# Patient Record
Sex: Female | Born: 1960 | Race: Black or African American | Hispanic: No | Marital: Single | Smoking: Never smoker
Health system: Southern US, Community
[De-identification: ages and names within clinical notes are randomized; demographics above are authoritative.]

## PROBLEM LIST (undated history)

## (undated) DIAGNOSIS — K219 Gastro-esophageal reflux disease without esophagitis: Secondary | ICD-10-CM

## (undated) DIAGNOSIS — E0590 Thyrotoxicosis, unspecified without thyrotoxic crisis or storm: Secondary | ICD-10-CM

## (undated) DIAGNOSIS — Z9289 Personal history of other medical treatment: Secondary | ICD-10-CM

## (undated) DIAGNOSIS — D493 Neoplasm of unspecified behavior of breast: Secondary | ICD-10-CM

## (undated) DIAGNOSIS — K580 Irritable bowel syndrome with diarrhea: Secondary | ICD-10-CM

## (undated) DIAGNOSIS — E049 Nontoxic goiter, unspecified: Secondary | ICD-10-CM

## (undated) DIAGNOSIS — J45909 Unspecified asthma, uncomplicated: Secondary | ICD-10-CM

## (undated) DIAGNOSIS — E538 Deficiency of other specified B group vitamins: Secondary | ICD-10-CM

## (undated) DIAGNOSIS — D649 Anemia, unspecified: Secondary | ICD-10-CM

## (undated) DIAGNOSIS — R55 Syncope and collapse: Secondary | ICD-10-CM

## (undated) DIAGNOSIS — J8410 Pulmonary fibrosis, unspecified: Secondary | ICD-10-CM

## (undated) DIAGNOSIS — N6009 Solitary cyst of unspecified breast: Secondary | ICD-10-CM

## (undated) DIAGNOSIS — E079 Disorder of thyroid, unspecified: Secondary | ICD-10-CM

## (undated) DIAGNOSIS — F319 Bipolar disorder, unspecified: Secondary | ICD-10-CM

## (undated) DIAGNOSIS — F419 Anxiety disorder, unspecified: Secondary | ICD-10-CM

## (undated) DIAGNOSIS — I10 Essential (primary) hypertension: Secondary | ICD-10-CM

## (undated) HISTORY — DX: Gastro-esophageal reflux disease without esophagitis: K21.9

## (undated) HISTORY — DX: Solitary cyst of unspecified breast: N60.09

## (undated) HISTORY — DX: Bipolar disorder, unspecified: F31.9

## (undated) HISTORY — PX: TONSILLECTOMY: SUR1361

## (undated) HISTORY — PX: BREAST BIOPSY: SHX20

## (undated) HISTORY — DX: Deficiency of other specified B group vitamins: E53.8

## (undated) HISTORY — DX: Personal history of other medical treatment: Z92.89

## (undated) HISTORY — DX: Disorder of thyroid, unspecified: E07.9

## (undated) HISTORY — DX: Irritable bowel syndrome with diarrhea: K58.0

## (undated) HISTORY — DX: Pulmonary fibrosis, unspecified: J84.10

## (undated) HISTORY — PX: BUNIONECTOMY: SHX129

## (undated) HISTORY — DX: Nontoxic goiter, unspecified: E04.9

## (undated) HISTORY — PX: TUBAL LIGATION: SHX77

## (undated) HISTORY — DX: Anemia, unspecified: D64.9

## (undated) HISTORY — DX: Syncope and collapse: R55

---

## 1983-08-19 DIAGNOSIS — F319 Bipolar disorder, unspecified: Secondary | ICD-10-CM

## 1983-08-19 HISTORY — PX: GALLBLADDER SURGERY: SHX652

## 1983-08-19 HISTORY — DX: Bipolar disorder, unspecified: F31.9

## 1985-08-18 HISTORY — PX: KNEE SURGERY: SHX244

## 1998-02-08 ENCOUNTER — Emergency Department (HOSPITAL_COMMUNITY): Admission: EM | Admit: 1998-02-08 | Discharge: 1998-02-08 | Payer: Self-pay | Admitting: Emergency Medicine

## 1998-04-08 ENCOUNTER — Emergency Department (HOSPITAL_COMMUNITY): Admission: EM | Admit: 1998-04-08 | Discharge: 1998-04-08 | Payer: Self-pay | Admitting: Emergency Medicine

## 1998-04-09 ENCOUNTER — Emergency Department (HOSPITAL_COMMUNITY): Admission: EM | Admit: 1998-04-09 | Discharge: 1998-04-09 | Payer: Self-pay | Admitting: Emergency Medicine

## 1998-08-09 ENCOUNTER — Encounter: Payer: Self-pay | Admitting: Emergency Medicine

## 1998-08-09 ENCOUNTER — Emergency Department (HOSPITAL_COMMUNITY): Admission: EM | Admit: 1998-08-09 | Discharge: 1998-08-09 | Payer: Self-pay | Admitting: Emergency Medicine

## 1999-03-11 ENCOUNTER — Encounter: Payer: Self-pay | Admitting: Pulmonary Disease

## 1999-04-05 ENCOUNTER — Encounter: Payer: Self-pay | Admitting: Pulmonary Disease

## 1999-08-19 HISTORY — PX: MEDIASTINOSCOPY: SHX5086

## 1999-08-19 HISTORY — PX: STERNOTOMY: SHX1057

## 1999-09-30 ENCOUNTER — Encounter: Payer: Self-pay | Admitting: Pulmonary Disease

## 2002-10-24 ENCOUNTER — Emergency Department (HOSPITAL_COMMUNITY): Admission: EM | Admit: 2002-10-24 | Discharge: 2002-10-24 | Payer: Self-pay | Admitting: Emergency Medicine

## 2003-05-25 ENCOUNTER — Emergency Department (HOSPITAL_COMMUNITY): Admission: EM | Admit: 2003-05-25 | Discharge: 2003-05-25 | Payer: Self-pay | Admitting: Emergency Medicine

## 2003-06-08 ENCOUNTER — Emergency Department (HOSPITAL_COMMUNITY): Admission: EM | Admit: 2003-06-08 | Discharge: 2003-06-08 | Payer: Self-pay | Admitting: Emergency Medicine

## 2003-06-08 ENCOUNTER — Encounter: Payer: Self-pay | Admitting: Emergency Medicine

## 2003-08-30 ENCOUNTER — Ambulatory Visit (HOSPITAL_COMMUNITY): Admission: RE | Admit: 2003-08-30 | Discharge: 2003-08-30 | Payer: Self-pay | Admitting: Family Medicine

## 2004-01-08 ENCOUNTER — Encounter: Admission: RE | Admit: 2004-01-08 | Discharge: 2004-02-06 | Payer: Self-pay | Admitting: Orthopedic Surgery

## 2004-02-27 ENCOUNTER — Emergency Department (HOSPITAL_COMMUNITY): Admission: EM | Admit: 2004-02-27 | Discharge: 2004-02-27 | Payer: Self-pay | Admitting: Emergency Medicine

## 2004-02-29 ENCOUNTER — Encounter: Admission: RE | Admit: 2004-02-29 | Discharge: 2004-03-20 | Payer: Self-pay | Admitting: Orthopedic Surgery

## 2004-03-01 ENCOUNTER — Encounter: Admission: RE | Admit: 2004-03-01 | Discharge: 2004-03-01 | Payer: Self-pay | Admitting: Emergency Medicine

## 2004-03-06 ENCOUNTER — Emergency Department (HOSPITAL_COMMUNITY): Admission: EM | Admit: 2004-03-06 | Discharge: 2004-03-07 | Payer: Self-pay | Admitting: Emergency Medicine

## 2005-08-23 ENCOUNTER — Emergency Department (HOSPITAL_COMMUNITY): Admission: EM | Admit: 2005-08-23 | Discharge: 2005-08-23 | Payer: Self-pay | Admitting: Emergency Medicine

## 2005-10-03 ENCOUNTER — Emergency Department (HOSPITAL_COMMUNITY): Admission: EM | Admit: 2005-10-03 | Discharge: 2005-10-03 | Payer: Self-pay | Admitting: Emergency Medicine

## 2006-10-06 ENCOUNTER — Other Ambulatory Visit: Admission: RE | Admit: 2006-10-06 | Discharge: 2006-10-06 | Payer: Self-pay | Admitting: Gynecology

## 2006-10-22 ENCOUNTER — Encounter: Admission: RE | Admit: 2006-10-22 | Discharge: 2006-10-22 | Payer: Self-pay | Admitting: Gynecology

## 2006-11-04 ENCOUNTER — Encounter: Admission: RE | Admit: 2006-11-04 | Discharge: 2006-11-04 | Payer: Self-pay | Admitting: Gynecology

## 2006-11-28 ENCOUNTER — Emergency Department (HOSPITAL_COMMUNITY): Admission: EM | Admit: 2006-11-28 | Discharge: 2006-11-28 | Payer: Self-pay | Admitting: Emergency Medicine

## 2007-06-27 ENCOUNTER — Emergency Department (HOSPITAL_COMMUNITY): Admission: EM | Admit: 2007-06-27 | Discharge: 2007-06-27 | Payer: Self-pay | Admitting: Emergency Medicine

## 2008-02-29 ENCOUNTER — Emergency Department (HOSPITAL_COMMUNITY): Admission: EM | Admit: 2008-02-29 | Discharge: 2008-02-29 | Payer: Self-pay | Admitting: Emergency Medicine

## 2008-04-11 ENCOUNTER — Emergency Department (HOSPITAL_COMMUNITY): Admission: EM | Admit: 2008-04-11 | Discharge: 2008-04-11 | Payer: Self-pay | Admitting: Emergency Medicine

## 2008-11-21 ENCOUNTER — Encounter: Admission: RE | Admit: 2008-11-21 | Discharge: 2008-11-21 | Payer: Self-pay | Admitting: Family Medicine

## 2009-01-30 ENCOUNTER — Emergency Department (HOSPITAL_COMMUNITY): Admission: EM | Admit: 2009-01-30 | Discharge: 2009-01-30 | Payer: Self-pay | Admitting: Emergency Medicine

## 2009-02-20 ENCOUNTER — Encounter (HOSPITAL_COMMUNITY): Admission: RE | Admit: 2009-02-20 | Discharge: 2009-05-17 | Payer: Self-pay | Admitting: Endocrinology

## 2009-03-29 ENCOUNTER — Emergency Department (HOSPITAL_COMMUNITY): Admission: EM | Admit: 2009-03-29 | Discharge: 2009-03-29 | Payer: Self-pay | Admitting: Emergency Medicine

## 2009-04-17 ENCOUNTER — Emergency Department (HOSPITAL_COMMUNITY): Admission: EM | Admit: 2009-04-17 | Discharge: 2009-04-17 | Payer: Self-pay | Admitting: Family Medicine

## 2009-04-20 ENCOUNTER — Emergency Department (HOSPITAL_COMMUNITY): Admission: EM | Admit: 2009-04-20 | Discharge: 2009-04-20 | Payer: Self-pay | Admitting: Emergency Medicine

## 2009-09-11 ENCOUNTER — Emergency Department (HOSPITAL_COMMUNITY): Admission: EM | Admit: 2009-09-11 | Discharge: 2009-09-11 | Payer: Self-pay | Admitting: Emergency Medicine

## 2009-09-26 ENCOUNTER — Emergency Department (HOSPITAL_COMMUNITY): Admission: EM | Admit: 2009-09-26 | Discharge: 2009-09-26 | Payer: Self-pay | Admitting: Emergency Medicine

## 2009-11-22 ENCOUNTER — Encounter: Admission: RE | Admit: 2009-11-22 | Discharge: 2009-11-22 | Payer: Self-pay | Admitting: Family Medicine

## 2009-12-13 ENCOUNTER — Encounter: Admission: RE | Admit: 2009-12-13 | Discharge: 2009-12-13 | Payer: Self-pay | Admitting: Family Medicine

## 2009-12-18 ENCOUNTER — Encounter: Admission: RE | Admit: 2009-12-18 | Discharge: 2009-12-18 | Payer: Self-pay | Admitting: Family Medicine

## 2010-01-04 ENCOUNTER — Encounter: Payer: Self-pay | Admitting: Family

## 2010-01-08 ENCOUNTER — Encounter: Payer: Self-pay | Admitting: Family

## 2010-02-07 ENCOUNTER — Encounter: Payer: Self-pay | Admitting: Pulmonary Disease

## 2010-02-07 ENCOUNTER — Emergency Department (HOSPITAL_COMMUNITY): Admission: EM | Admit: 2010-02-07 | Discharge: 2010-02-07 | Payer: Self-pay | Admitting: Emergency Medicine

## 2010-02-11 ENCOUNTER — Encounter (INDEPENDENT_AMBULATORY_CARE_PROVIDER_SITE_OTHER): Payer: Self-pay | Admitting: *Deleted

## 2010-02-11 ENCOUNTER — Ambulatory Visit: Payer: Self-pay | Admitting: Family

## 2010-02-11 DIAGNOSIS — R0789 Other chest pain: Secondary | ICD-10-CM | POA: Insufficient documentation

## 2010-02-11 DIAGNOSIS — K219 Gastro-esophageal reflux disease without esophagitis: Secondary | ICD-10-CM | POA: Insufficient documentation

## 2010-02-11 DIAGNOSIS — E0590 Thyrotoxicosis, unspecified without thyrotoxic crisis or storm: Secondary | ICD-10-CM | POA: Insufficient documentation

## 2010-02-11 DIAGNOSIS — D649 Anemia, unspecified: Secondary | ICD-10-CM | POA: Insufficient documentation

## 2010-02-11 DIAGNOSIS — J8410 Pulmonary fibrosis, unspecified: Secondary | ICD-10-CM | POA: Insufficient documentation

## 2010-02-12 ENCOUNTER — Encounter: Payer: Self-pay | Admitting: Family

## 2010-02-12 ENCOUNTER — Telehealth: Payer: Self-pay | Admitting: Family

## 2010-02-12 ENCOUNTER — Encounter (INDEPENDENT_AMBULATORY_CARE_PROVIDER_SITE_OTHER): Payer: Self-pay | Admitting: *Deleted

## 2010-02-12 LAB — CONVERTED CEMR LAB
Iron: 11 ug/dL — ABNORMAL LOW (ref 42–145)
Saturation Ratios: 3 % — ABNORMAL LOW (ref 20–55)
TIBC: 438 ug/dL (ref 250–470)
UIBC: 427 ug/dL
Vitamin B-12: 228 pg/mL (ref 211–911)

## 2010-02-13 ENCOUNTER — Telehealth: Payer: Self-pay | Admitting: Family

## 2010-02-28 ENCOUNTER — Telehealth: Payer: Self-pay | Admitting: Family

## 2010-03-01 ENCOUNTER — Ambulatory Visit: Payer: Self-pay | Admitting: Pulmonary Disease

## 2010-03-08 ENCOUNTER — Encounter (INDEPENDENT_AMBULATORY_CARE_PROVIDER_SITE_OTHER): Payer: Self-pay | Admitting: *Deleted

## 2010-03-08 ENCOUNTER — Ambulatory Visit: Payer: Self-pay | Admitting: Gastroenterology

## 2010-03-08 DIAGNOSIS — F319 Bipolar disorder, unspecified: Secondary | ICD-10-CM | POA: Insufficient documentation

## 2010-03-08 DIAGNOSIS — E538 Deficiency of other specified B group vitamins: Secondary | ICD-10-CM | POA: Insufficient documentation

## 2010-03-11 ENCOUNTER — Ambulatory Visit: Payer: Self-pay | Admitting: Family

## 2010-03-12 ENCOUNTER — Telehealth: Payer: Self-pay | Admitting: Family

## 2010-03-13 ENCOUNTER — Ambulatory Visit: Payer: Self-pay | Admitting: Gastroenterology

## 2010-03-13 LAB — HM COLONOSCOPY

## 2010-03-15 ENCOUNTER — Ambulatory Visit: Payer: Self-pay | Admitting: Gastroenterology

## 2010-03-19 ENCOUNTER — Encounter: Payer: Self-pay | Admitting: Pulmonary Disease

## 2010-03-20 ENCOUNTER — Ambulatory Visit: Payer: Self-pay | Admitting: Family

## 2010-03-22 ENCOUNTER — Telehealth: Payer: Self-pay | Admitting: Family

## 2010-03-22 ENCOUNTER — Ambulatory Visit: Payer: Self-pay | Admitting: Gastroenterology

## 2010-03-25 ENCOUNTER — Encounter: Payer: Self-pay | Admitting: Gastroenterology

## 2010-04-18 ENCOUNTER — Encounter: Payer: Self-pay | Admitting: Family

## 2010-04-18 LAB — CONVERTED CEMR LAB
Eosinophils Absolute: 0.1 10*3/uL (ref 0.0–0.7)
Eosinophils Relative: 1 % (ref 0–5)
HCT: 37.4 % (ref 36.0–46.0)
Hemoglobin: 11 g/dL — ABNORMAL LOW (ref 12.0–15.0)
Lymphocytes Relative: 19 % (ref 12–46)
Lymphs Abs: 1.7 10*3/uL (ref 0.7–4.0)
MCV: 83.1 fL (ref 78.0–100.0)
Monocytes Relative: 6 % (ref 3–12)
RBC: 4.5 M/uL (ref 3.87–5.11)
WBC: 9.1 10*3/uL (ref 4.0–10.5)

## 2010-04-19 ENCOUNTER — Encounter: Payer: Self-pay | Admitting: Family

## 2010-04-24 ENCOUNTER — Ambulatory Visit: Payer: Self-pay | Admitting: Gastroenterology

## 2010-04-30 ENCOUNTER — Telehealth: Payer: Self-pay | Admitting: Gastroenterology

## 2010-05-01 ENCOUNTER — Ambulatory Visit: Payer: Self-pay | Admitting: Gastroenterology

## 2010-05-01 DIAGNOSIS — M79609 Pain in unspecified limb: Secondary | ICD-10-CM | POA: Insufficient documentation

## 2010-05-02 ENCOUNTER — Telehealth: Payer: Self-pay | Admitting: Family

## 2010-05-15 ENCOUNTER — Ambulatory Visit: Payer: Self-pay | Admitting: Family

## 2010-05-17 ENCOUNTER — Encounter: Payer: Self-pay | Admitting: Family

## 2010-05-21 ENCOUNTER — Ambulatory Visit: Payer: Self-pay | Admitting: Internal Medicine

## 2010-05-21 LAB — CONVERTED CEMR LAB
Basophils Absolute: 0 10*3/uL (ref 0.0–0.1)
Eosinophils Absolute: 0.1 10*3/uL (ref 0.0–0.7)
Eosinophils Relative: 1 % (ref 0–5)
HCT: 38.4 % (ref 36.0–46.0)
Hemoglobin: 11.2 g/dL — ABNORMAL LOW (ref 12.0–15.0)
Lymphs Abs: 1.4 10*3/uL (ref 0.7–4.0)
MCV: 87.1 fL (ref 78.0–100.0)
Monocytes Absolute: 0.6 10*3/uL (ref 0.1–1.0)
Platelets: 245 10*3/uL (ref 150–400)
RDW: 16.8 % — ABNORMAL HIGH (ref 11.5–15.5)
TSH: 0.478 microintl units/mL (ref 0.350–4.500)

## 2010-05-22 ENCOUNTER — Encounter: Payer: Self-pay | Admitting: Family

## 2010-05-22 ENCOUNTER — Ambulatory Visit: Payer: Self-pay | Admitting: Gastroenterology

## 2010-05-23 ENCOUNTER — Ambulatory Visit (HOSPITAL_COMMUNITY): Admission: RE | Admit: 2010-05-23 | Discharge: 2010-05-23 | Payer: Self-pay | Admitting: Internal Medicine

## 2010-05-24 ENCOUNTER — Encounter: Payer: Self-pay | Admitting: Family

## 2010-06-03 HISTORY — PX: ABDOMINAL HYSTERECTOMY: SHX81

## 2010-06-16 ENCOUNTER — Other Ambulatory Visit: Payer: Self-pay | Admitting: Emergency Medicine

## 2010-06-16 ENCOUNTER — Ambulatory Visit (HOSPITAL_COMMUNITY): Admission: AD | Admit: 2010-06-16 | Discharge: 2010-06-16 | Payer: Self-pay | Admitting: Obstetrics and Gynecology

## 2010-06-24 ENCOUNTER — Ambulatory Visit: Payer: Self-pay | Admitting: Gastroenterology

## 2010-07-16 ENCOUNTER — Encounter: Admission: RE | Admit: 2010-07-16 | Discharge: 2010-07-16 | Payer: Self-pay

## 2010-07-23 ENCOUNTER — Ambulatory Visit: Payer: Self-pay | Admitting: Family

## 2010-07-23 DIAGNOSIS — N6009 Solitary cyst of unspecified breast: Secondary | ICD-10-CM | POA: Insufficient documentation

## 2010-07-23 LAB — CONVERTED CEMR LAB
Basophils Absolute: 0.1 10*3/uL (ref 0.0–0.1)
Basophils Relative: 1 % (ref 0–1)
Eosinophils Relative: 2 % (ref 0–5)
HCT: 36.2 % (ref 36.0–46.0)
Hemoglobin: 10.6 g/dL — ABNORMAL LOW (ref 12.0–15.0)
Lymphocytes Relative: 25 % (ref 12–46)
MCHC: 29.3 g/dL — ABNORMAL LOW (ref 30.0–36.0)
Monocytes Absolute: 0.7 10*3/uL (ref 0.1–1.0)
Platelets: 262 10*3/uL (ref 150–400)
RDW: 15 % (ref 11.5–15.5)
Vitamin B-12: 415 pg/mL (ref 211–911)

## 2010-07-24 ENCOUNTER — Encounter: Payer: Self-pay | Admitting: Family

## 2010-07-24 ENCOUNTER — Telehealth: Payer: Self-pay | Admitting: Family

## 2010-07-26 ENCOUNTER — Ambulatory Visit: Payer: Self-pay | Admitting: Gastroenterology

## 2010-08-27 ENCOUNTER — Ambulatory Visit
Admission: RE | Admit: 2010-08-27 | Discharge: 2010-08-27 | Payer: Self-pay | Source: Home / Self Care | Attending: Gastroenterology | Admitting: Gastroenterology

## 2010-09-07 ENCOUNTER — Encounter: Payer: Self-pay | Admitting: Family Medicine

## 2010-09-08 ENCOUNTER — Encounter: Payer: Self-pay | Admitting: Family Medicine

## 2010-09-08 ENCOUNTER — Encounter: Payer: Self-pay | Admitting: Gynecology

## 2010-09-09 ENCOUNTER — Encounter: Payer: Self-pay | Admitting: Endocrinology

## 2010-09-15 LAB — CONVERTED CEMR LAB
ALT: <8 units/L (ref 0–35)
AST: 15 units/L (ref 0–37)
Alkaline Phosphatase: 50 units/L (ref 39–117)
Calcium: 9.5 mg/dL (ref 8.4–10.5)
Chloride: 104 meq/L (ref 96–112)
Creatinine, Ser: 0.74 mg/dL (ref 0.40–1.20)
Eosinophils Absolute: 0.1 10*3/uL (ref 0.0–0.7)
Lymphocytes Relative: 21 % (ref 12–46)
Lymphs Abs: 1.8 10*3/uL (ref 0.7–4.0)
MCV: 74.3 fL — ABNORMAL LOW (ref 78.0–100.0)
Neutrophils Relative %: 69 % (ref 43–77)
Pap Smear: NORMAL
Platelets: 304 10*3/uL (ref 150–400)
WBC: 8.3 10*3/uL (ref 4.0–10.5)

## 2010-09-17 NOTE — Letter (Signed)
Summary: Work Dietitian at [employer]. [address]   [place], [place] [address]   Phone: [phone]  Fax: [phone]      Today's Date: February 11, 2010  Name of Patient: Lynn Gonzalez  The above named patient had a medical visit today. Please take this into consideration when reviewing the time away from work.    Special Instructions:  Arly.Keller ] None  [  ] To be off the remainder of today, returning to the normal work / school schedule tomorrow.  [  ] To be off until the next scheduled appointment on ______________________.  [X]  Other _[name] may return to work without any restrictions.   Sincerely yours,    Sandford Craze, NP

## 2010-09-17 NOTE — Progress Notes (Signed)
Summary: repeat CBC  Phone Note Outgoing Call   Summary of Call: Could you please call patient and let her know that I would like to repeat her CBC in the next 1 week.  (dx anemia).  When I reviewed her labs- I realized that her last CBC was over a month ago.  I would like to see if it is improving. Initial call taken by: Lemont Fillers FNP,  March 22, 2010 11:59 AM  Follow-up for Phone Call        Pt advised and states she will come by next week for CBC. Order has been sent to the lab.  [name] CMA Duncan Dull)  March 22, 2010 1:03 PM

## 2010-09-17 NOTE — Assessment & Plan Note (Signed)
Summary: lung disease/ mbw   Visit Type:  Initial Consult Copy to:  pcp Primary Provider/Referring Provider:  Lemont Fillers FNP  CC:  Pt here for pulmonary consult.  History of Present Illness: 50/F, never smoker, bipolar disorder for evaluation of abnormal imaging. She was seen at the [facility] ED for chest pain on 6/23 .Sserial cardiac enzymes were negative.  D Dimer was elevated and was followed by  a CTA chest which was negative for PE.  She was noted incidentally on CT to have diffuse reticular interstitial lung disease which had worsened slightly since the prior CT from April, 2008.  She denies known history of lung disease, but does note that she becomes winded with walking.  She reports surgery /mediastinoscopy at [facility] in 2001 followed by sternotomy at [facility] - but does not know details.  She reports that chest pain is constant and is not worsened by exertion, nor improved with rest.   Chest pain is not exacerbated by activity, nor improved with rest.  She notes some improvement with prescription pain medication, but then pain returns when medication wears off. She is to have a GI evaluation on 6/22 for Fe def anemia    Preventive Screening-Counseling & Management  Alcohol-Tobacco     Smoking Status: never  Current Medications (verified): 1)  Lithium Carbonate 300 Mg Tabs (Lithium Carbonate) .... Take 1 Every Morning And1/2-1 At Bedtime. 2)  Seroquel 300 Mg Tabs (Quetiapine Fumarate) .... 1/2 To 1 At Bedtime. 3)  Protonix 40 Mg Solr (Pantoprazole Sodium) .... One Tablet By Mouth Daily 4)  Ferrous Sulfate 325 (65 Fe) Mg Tbec (Ferrous Sulfate) .... One Tab By Mouth Three Times A Day 5)  Vitamin B-12 1000 Mcg Tabs (Cyanocobalamin) .... One Tablet By Mouth Daily  Allergies (verified): No Known Drug Allergies  Past History:  Past Medical History: Last updated: 02/11/2010 Bipolar (?nervous breakdown--1985) ?THYROID OVERACTIVE  Past Surgical History: Tubal  ligation--1985, 1996 Gall stones removed-1985 mediastinoscopy/ sternotomy--2001 Bunions removed from both feet Bilateral knee surgery--1987  Family History: ?cancer--father Diabetes--mother HTN--parents Cancer-father  Review of Systems       The patient complains of chest pain, weight change, difficulty swallowing, tooth/dental problems, and depression.  The patient denies shortness of breath with activity, shortness of breath at rest, productive cough, non-productive cough, coughing up blood, irregular heartbeats, acid heartburn, indigestion, loss of appetite, abdominal pain, sore throat, headaches, nasal congestion/difficulty breathing through nose, sneezing, itching, ear ache, anxiety, hand/feet swelling, joint stiffness or pain, rash, change in color of mucus, and fever.    Vital Signs:  Patient profile:   50 year old female Height:      65 inches Weight:      145.4 pounds BMI:     24.28 O2 Sat:      100 % on Room air Temp:     98.6 degrees F oral Pulse rate:   81 / minute BP sitting:   110 / 70  (left arm) Cuff size:   regular  Vitals Entered By: Zackery Barefoot CMA (March 01, 2010 9:38 AM)  O2 Flow:  Room air CC: Pt here for pulmonary consult Comments Medications reviewed with patient Verified contact number and pharmacy with patient Zackery Barefoot CMA  March 01, 2010 9:40 AM    Physical Exam  Additional Exam:  Gen. Pleasant, well-nourished, in no distress, normal affect ENT - no lesions, no post nasal drip Neck: No JVD, no thyromegaly, no carotid bruits Lungs: no use of accessory muscles,  no dullness to percussion, clear without rales or rhonchi  Cardiovascular: Rhythm regular, heart sounds  normal, no murmurs or gallops, no peripheral edema Abdomen: soft and non-tender, no hepatosplenomegaly, BS normal. Musculoskeletal: No deformities, no cyanosis or clubbing Neuro:  alert, non focal     Impression & Recommendations:  Problem # 1:  INTERSTITIAL LUNG  DISEASE (ICD-515) Unclear cause but would favor sarcoidosis obtain records about surgeries & pathology from [facility] in 2001 Chk ANa, ESR, RA factor, ACe level Full pFTs  Medications Added to Medication List This Visit: 1)  Lithium Carbonate 300 Mg Tabs (Lithium carbonate) .... Take 1 every morning and1/2-1 at bedtime.  Other Orders: T-Angiotensin i-Converting Enzyme ([id number]) T-Antinuclear Antib (ANA) [phone]) T- * Misc. Laboratory test [phone]) Pulmonary Referral (Pulmonary) TLB-Sedimentation Rate (ESR) (85652-ESR) Consultation Level IV (46962)  Patient Instructions: 1)  Please schedule a follow-up appointment in 1 month at [facility] 2)  You have scar tissue in your lungs 3)  We will obtain records about your surgeries & pathology from [facility] in 2001 4)  Blood owrk today 5)  You have been asked to make an appointment for a Pulmonary Function Test (Breathing Test) prior to or at the time of your next visit. Use medications as usual unless otherwise instructed.    Appended Document: lung disease/ mbw obtain records about  surgeries & pathology from [facility] in 2001  Appended Document: lung disease/ mbw request faxed. 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

## 2010-09-17 NOTE — Assessment & Plan Note (Signed)
Summary: Dr.Patterson pt.-11 a.m. appt   History of Present Illness Visit Type: Follow-up Visit Primary GI MD: Sheryn Bison MD FACP FAGA Primary Provider: Lemont Fillers FNP Requesting Provider: Sandford Craze, DO Chief Complaint: B12 injection  site sore and swollen History of Present Illness:   Patient is a 50 year old female who is in process of anemia workup by Dr. Jarold Motto and has recently started  receiving B12 injections. Patient's last B12 injection with [place] was approximately six days ago. Since injection patient has had intermittent discomfort. She called with complaints of swelling and erythema and is here to be checked.  She wants to continue injections as they make her feel better.   GI Review of Systems      Denies abdominal pain, acid reflux, belching, bloating, chest pain, dysphagia with liquids, dysphagia with solids, heartburn, loss of appetite, nausea, vomiting, vomiting blood, weight loss, and  weight gain.        Denies anal fissure, black tarry stools, change in bowel habit, constipation, diarrhea, diverticulosis, fecal incontinence, heme positive stool, hemorrhoids, irritable bowel syndrome, jaundice, light color stool, liver problems, rectal bleeding, and  rectal pain.    Current Medications (verified): 1)  Lithium Carbonate 300 Mg Tabs (Lithium Carbonate) .... Take 1 Every Morning And1/2-1 At Bedtime. 2)  Seroquel 300 Mg Tabs (Quetiapine Fumarate) .... 1/2 To 1 At Bedtime. 3)  Protonix 40 Mg Solr (Pantoprazole Sodium) .... One Tablet By Mouth Daily 4)  Ferrous Sulfate 325 (65 Fe) Mg Tbec (Ferrous Sulfate) .... One Tab By Mouth Three Times A Day 5)  Cyanocobalamin 1000 Mcg/ml Soln (Cyanocobalamin) .... Monthly Im Injections 6)  Cyanocobalamin 1000 Mcg/ml Inj Soln (Cyanocobalamin) .Marland Kitchen.. 1 Cc Im Weekly X 3 Than Monthly 7)  Folic Acid 1 Mg Tabs (Folic Acid) .Marland Kitchen.. 1 By Mouth Once Daily  Allergies (verified): No Known Drug Allergies  Past History:  Past  Medical History: Reviewed history from 02/11/2010 and no changes required. Bipolar (?nervous breakdown--1985) ?THYROID OVERACTIVE  Past Surgical History: Reviewed history from 03/01/2010 and no changes required. Tubal ligation--1985, 1996 Gall stones removed-1985 mediastinoscopy/ sternotomy--2001 Bunions removed from both feet Bilateral knee surgery--1987  Family History: Reviewed history from 03/01/2010 and no changes required. ?cancer--father Diabetes--mother HTN--parents Cancer-father  Social History: Reviewed history from 02/11/2010 and no changes required. Occupation: [employer] Never Smoked Alcohol use-no Regular exercise-yes  Vital Signs:  Patient profile:   50 year old female Height:      66 inches Weight:      152.38 pounds BMI:     24.68 Pulse rate:   84 / minute Pulse rhythm:   regular BP sitting:   120 / 70  (left arm) Cuff size:   regular  Vitals Entered By: June McMurray CMA Duncan Dull) (May 01, 2010 11:24 AM)  Physical Exam  General:  Well developed, well nourished, no acute distress. Extremities:  Left upper extremity comparable in size to RUE. Deltoid muscle non-tender. No obvious erythema. No  lumps. Moves LUE without difficulty. Normal left radial pulse.  Psych:  Appropriate   Impression & Recommendations:  Problem # 1:  ARM PAIN, LEFT (ICD-729.5) Assessment New Called with left upper extremity pain after B12 injection last week. Left upper extremity examination unremarkable. Patient can take Tylenol as needed for pain.

## 2010-09-17 NOTE — Letter (Signed)
Summary: [facility] Instructions  [facility]  [address] [place], [place] [address]   Phone: [phone]  Fax: [phone]       Lynn Gonzalez    05/28/1961    MRN: [id number]        Procedure Day Dorna Bloom: Wednesday, 03/13/10     Arrival Time: 1;30      Procedure Time: 2:30     Location of Procedure:                    _X _   [facility] (4th Floor)                        PREPARATION FOR COLONOSCOPY WITH MOVIPREP   Starting 5 days prior to your procedure 03/08/10 do not eat nuts, seeds, popcorn, corn, beans, peas,  salads, or any raw vegetables.  Do not take any fiber supplements (e.g. Metamucil, Citrucel, and Benefiber).  THE DAY BEFORE YOUR PROCEDURE         DATE: 03/12/10    DAY: Tuesday  1.  Drink clear liquids the entire day-NO SOLID FOOD  2.  Do not drink anything colored red or purple.  Avoid juices with pulp.  No orange juice.  3.  Drink at least 64 oz. (8 glasses) of fluid/clear liquids during the day to prevent dehydration and help the prep work efficiently.  CLEAR LIQUIDS INCLUDE: Water Jello Ice Popsicles Tea (sugar ok, no milk/cream) Powdered fruit flavored drinks Coffee (sugar ok, no milk/cream) Gatorade Juice: apple, white grape, white cranberry  Lemonade Clear bullion, consomm, broth Carbonated beverages (any kind) Strained chicken noodle soup Hard Candy                             4.  In the morning, mix first dose of MoviPrep solution:    Empty 1 Pouch A and 1 Pouch B into the disposable container    Add lukewarm drinking water to the top line of the container. Mix to dissolve    Refrigerate (mixed solution should be used within 24 hrs)  5.  Begin drinking the prep at 5:00 p.m. The MoviPrep container is divided by 4 marks.   Every 15 minutes drink the solution down to the next mark (approximately 8 oz) until the full liter is complete.   6.  Follow completed prep with 16 oz of clear liquid of your choice (Nothing  red or purple).  Continue to drink clear liquids until bedtime.  7.  Before going to bed, mix second dose of MoviPrep solution:    Empty 1 Pouch A and 1 Pouch B into the disposable container    Add lukewarm drinking water to the top line of the container. Mix to dissolve    Refrigerate  THE DAY OF YOUR PROCEDURE      DATE: 03/13/10   DAY: Wednesday  Beginning at 9:30 a.m. (5 hours before procedure):         1. Every 15 minutes, drink the solution down to the next mark (approx 8 oz) until the full liter is complete.  2. Follow completed prep with 16 oz. of clear liquid of your choice.    3. You may drink clear liquids until  12:30 (2 HOURS BEFORE PROCEDURE).   MEDICATION INSTRUCTIONS  Unless otherwise instructed, you should take regular prescription medications with a small sip of water   as early as possible  the morning of your procedure.                    OTHER INSTRUCTIONS  You will need a responsible adult at least 48 years of age to accompany you and drive you home.   This person must remain in the waiting room during your procedure.  Wear loose fitting clothing that is easily removed.  Leave jewelry and other valuables at home.  However, you may wish to bring a book to read or  an iPod/MP3 player to listen to music as you wait for your procedure to start.  Remove all body piercing jewelry and leave at home.  Total time from sign-in until discharge is approximately 2-3 hours.  You should go home directly after your procedure and rest.  You can resume normal activities the  day after your procedure.  The day of your procedure you should not:   Drive   Make legal decisions   Operate machinery   Drink alcohol   Return to work  You will receive specific instructions about eating, activities and medications before you leave.    The above instructions have been reviewed and explained to me by   _______________________    I fully understand and  can verbalize these instructions _____________________________ Date _________

## 2010-09-17 NOTE — Assessment & Plan Note (Signed)
Summary: WEEKLY B12 SHOT...LSW.  Nurse Visit   Allergies: No Known Drug Allergies  Medication Administration  Injection # 1:    Medication: Vit B12 1000 mcg    Diagnosis: B12 DEFICIENCY (ICD-266.2)    Route: IM    Site: L deltoid    Exp Date: 4/13    Lot #: [id number]    Mfr: [employer]    Patient tolerated injection without complications    Given by: Karen Kitchens Nelson-Smith CMA (AAMA) (March 22, 2010 9:08 AM)  Orders Added: 1)  Vit B12 1000 mcg [J3420]   Medication Administration  Injection # 1:    Medication: Vit B12 1000 mcg    Diagnosis: B12 DEFICIENCY (ICD-266.2)    Route: IM    Site: L deltoid    Exp Date: 4/13    Lot #: [id number]    Mfr: [employer]    Patient tolerated injection without complications    Given by: Karen Kitchens Nelson-Smith CMA (AAMA) (March 22, 2010 9:08 AM)  Orders Added: 1)  Vit B12 1000 mcg [J3420]

## 2010-09-17 NOTE — Miscellaneous (Signed)
Summary: ekg  Clinical Lists Changes  Orders: Added new Service order of EKG w/ Interpretation (93000) - Signed

## 2010-09-17 NOTE — Assessment & Plan Note (Signed)
Summary: 2 month follow up/mhf rsch from bmp list/d--Rm 5   Vital Signs:  Patient profile:   50 year old female Height:      66 inches Weight:      149 pounds BMI:     24.14 Temp:     98.0 degrees F oral Pulse rate:   78 / minute Pulse rhythm:   regular Resp:     16 per minute BP sitting:   110 / 70  (right arm) Cuff size:   regular  Vitals Entered By: Mervin Kung CMA (AAMA) (May 21, 2010 9:02 AM) CC: Rm 5  2 month f/u.  Pt having hysterectomy 06/03/10. Is Patient Diabetic? No Comments Pt needs clarification on medications.  Needs refills on Protonix and Folic Acid. Lynn Gonzalez CMA Duncan Dull)  May 21, 2010 9:15 AM    Primary Care Lynn Gonzalez:  Lemont Fillers FNP  CC:  Rm 5  2 month f/u.  Pt having hysterectomy 06/03/10.Marland Kitchen  History of Present Illness: Lynn Gonzalez is a 50 year old female who presents today for follow up.    1)Menorrhagia-  Pt notes that she is having a heavy menstrual cycle every 14 days.  These cycles are very heavy and are associated with severe menstrual cramps.  She is currently scheduled for a hysterectomy on 10/17 with Dr. Jennette Kettle.  2) GERD- better on protonix.  She continues to have a sense of "choking sensation" when she swallows. She had a negative endoscopy performed in June.  She notes poor appetite and early satiety.  3)Anemia-  she continues with iron supplements and monthly B12 injections.  4)Interstitial lung disease- she is being evaluated by Dr. Vassie Loll for suspected sarcoidosis.   Allergies (verified): No Known Drug Allergies  Past History:  Past Medical History: Last updated: 02/11/2010 Bipolar (?nervous breakdown--1985) ?THYROID OVERACTIVE  Past Surgical History: Last updated: 03/01/2010 Tubal ligation--1985, 1996 Gall stones removed-1985 mediastinoscopy/ sternotomy--2001 Bunions removed from both feet Bilateral knee surgery--1987 PMH reviewed for relevance  Review of Systems       see HPI  Physical  Exam  General:  Well-developed,well-nourished,in no acute distress; alert,appropriate and cooperative throughout examination Lungs:  Normal respiratory effort, chest expands symmetrically. Lungs are clear to auscultation, no crackles or wheezes. Heart:  Normal rate and regular rhythm. S1 and S2 normal without gallop, murmur, click, rub or other extra sounds. Extremities:  No clubbing, cyanosis, edema, or deformity noted with normal full range of motion of all joints.     Impression & Recommendations:  Problem # 1:  MENORRHAGIA, PERIMENOPAUSAL (ICD-626.8) Assessment Deteriorated Patient is scheduled for a hysterectomy in 2 weeks.  This hopefully will help control her anemia.  Orders: TLB-CBC Platelet - w/Differential ([id number])  Problem # 2:  HYPERTHYROIDISM (ICD-242.90) Assessment: New Patient with history of hyperthyroid.  Notes + weight loss this year of about 10 pounds.  Will repeat TSH and plan to check thyroid ultrasound to evaluate goiter and rule out any compressive nodules.  Orders: T-TSH [id number]) Misc. Referral (Misc. Ref)  Complete Medication List: 1)  Lithium Carbonate 300 Mg Tabs (Lithium carbonate) .... Take 1 every morning and 2 tablets at bedtime. 2)  Seroquel 300 Mg Tabs (Quetiapine fumarate) .... 1/2 tablet by mouth at bedtime 3)  Protonix 40 Mg Solr (Pantoprazole sodium) .... One tablet by mouth daily 4)  Ferrous Sulfate 325 (65 Fe) Mg Tbec (Ferrous sulfate) .... One tab by mouth three times a day 5)  Cyanocobalamin 1000 Mcg/ml Soln (Cyanocobalamin) .... Monthly  im injections 6)  Folic Acid 1 Mg Tabs (Folic acid) .Marland Kitchen.. 1 by mouth once daily  Patient Instructions: 1)  Please complete your lab work and ultrasound downstairs today. 2)  Good luck with your upcoming surgery. 3)  Follow up in 2 months, sooner if problems or concerns.  Prescriptions: FOLIC ACID 1 MG TABS (FOLIC ACID) 1 by mouth once daily  #30 x 5   Entered and Authorized by:   Lemont Fillers FNP   Signed by:   Lemont Fillers FNP on 05/21/2010   Method used:   Electronically to        [facility] [address] [phone]* (retail)       [address]       [place], [place]  [address]       Ph: [phone]       Fax: [phone]   RxID:   [id number] PROTONIX 40 MG SOLR (PANTOPRAZOLE SODIUM) one tablet by mouth daily  #30 x 5   Entered and Authorized by:   Lemont Fillers FNP   Signed by:   Lemont Fillers FNP on 05/21/2010   Method used:   Electronically to        [facility] [address] [phone]* (retail)       [address]       [place], [place]  [address]       Ph: [phone]       Fax: [phone]   RxID:   [id number]   Current Allergies (reviewed today): No known allergies

## 2010-09-17 NOTE — Progress Notes (Signed)
Summary: Patient unsure about referrals  Phone Note Call from Patient Call back at Home Phone [phone]   Caller: Patient Summary of Call: Pt wants to know if she needs to keep appt with Dr Vassie Loll tomorrow morning, she feels like she has a lot of appts and she's having surgery soon Initial call taken by: Lannette Donath,  February 28, 2010 4:17 PM  Follow-up for Phone Call        Pt states that she saw GYN and they are going to do a Hysterectomy on 05/02/10.  Pt wants to know if she needs to keep appts with other specialists.  I advised pt to keep referral with pulmonologist.  Pt states she has not had any chest pain or choking sensations since starting the Protonix.  Does she still need to see GI?  [name] CMA Duncan Dull)  February 28, 2010 4:25 PM   Additional Follow-up for Phone Call Additional follow up Details #1::        Yes should should still see Dr. Vassie Loll and GI. Additional Follow-up by: Lemont Fillers FNP,  February 28, 2010 4:58 PM    Additional Follow-up for Phone Call Additional follow up Details #2::    Left detailed message on voicemail informing pt to keep all referral appts and to call if she has any questions.  [name] CMA Duncan Dull)  March 01, 2010 8:07 AM

## 2010-09-17 NOTE — Letter (Signed)
Summary: [name]: Immunoassay Fecal Occult Blood (iFOB) Order Psychologist, counselling at [facility]  [address]   [place], [place] [address]   Phone: [phone]  Fax: [phone]      [facility]: Immunoassay Fecal Occult Blood (iFOB) Order Form   February 12, 2010 MRN: [id number]   Lynn Gonzalez [birth year]   Physicican Name:___Melissa Peggyann Juba, NP  Diagnosis Code:_____285.9________________      Mervin Kung, CMA(AAMA)

## 2010-09-17 NOTE — Letter (Signed)
Summary: New Patient letter  [facility]  [address] [place], [place] [address]   Phone: [phone]  Fax: [phone]       02/11/2010 MRN: [id number]  Lynn Gonzalez [address] [place], [place]  [address]  Dear Ms. Lynn Gonzalez,  Welcome to the Gastroenterology Division at [facility].    You are scheduled to see Dr.  [place]ella Hartigan on 03-08-10 at 9:30am on the 3rd floor at [facility], [address]  We ask that you try to arrive at our  office 15 minutes prior to your appointment time to allow for check-in.  We would like you to complete the enclosed self-administered evaluation form prior to your visit and bring it with you on the day of your appointment.  We will review it with you.  Also, please bring a complete list of all your medications or, if you prefer, bring the medication bottles and we will list them.  Please bring your insurance card so that we may make a copy of it.  If your insurance requires a referral to see a specialist, please bring your referral form from your primary care physician.  Co-payments are due at the time of your visit and may be paid by cash, check or credit card.     Your office visit will consist of a consult with your physician (includes a physical exam), any laboratory testing he/she may order, scheduling of any necessary diagnostic testing (e.g. x-ray, ultrasound, CT-scan), and scheduling of a procedure (e.g. Endoscopy, Colonoscopy) if required.  Please allow enough time on your schedule to allow for any/all of these possibilities.    If you cannot keep your appointment, please call [phone] to cancel or reschedule prior to your appointment date.  This allows [place] the opportunity to schedule an appointment for another patient in need of care.  If you do not cancel or reschedule by 5 p.m. the business day prior to your appointment date, you will be charged a $50.00 late cancellation/no-show fee.    Thank you for choosing  [facility] for your medical needs.  We appreciate the opportunity to care for you.  Please visit [place] at our website  to learn more about our practice.                     Sincerely,                                                             The Gastroenterology Division

## 2010-09-17 NOTE — Assessment & Plan Note (Signed)
Summary: B12 SHOT  Nurse Visit   Allergies: No Known Drug Allergies  Medication Administration  Injection # 1:    Medication: Vit B12 1000 mcg    Diagnosis: B12 DEFICIENCY (ICD-266.2)    Route: IM    Site: R deltoid    Exp Date: 02/16/2012    Lot #: [id number]    Mfr: [employer]    Patient tolerated injection without complications    Given by: Harlow Mares CMA (AAMA) (April 24, 2010 1:51 PM)

## 2010-09-17 NOTE — Assessment & Plan Note (Signed)
Summary: wakes up at night choking/dt--rm 4   Vital Signs:  Patient profile:   50 year old female Height:      65 inches Weight:      146.25 pounds BMI:     24.43 Temp:     98.4 degrees F oral Pulse rate:   84 / minute Pulse rhythm:   regular Resp:     18 per minute BP sitting:   120 / 90  (right arm) Cuff size:   regular  Vitals Entered By: Mervin Kung CMA (February 11, 2010 8:54 AM) CC: Room 4  Follow up from [facility] ER on 02/07/10 for chest pain. Is Patient Diabetic? No   CC:  Room 4  Follow up from [facility] ER on 02/07/10 for chest pain.Marland Kitchen  History of Present Illness: Lynn Gonzalez is a 50 year old female who presents today to establish care.  She was seen at the [facility] ED for chest pain on 6/23 and underwent evaluation.  [facility] record was reviewed) At this ED visit she underwent serial cardiac enzymes which were negative.  D Dimer was elevated and was followed by  a CTA chest which was negative for PE.  She was noted incidentally on CT to have diffuse reticular interstitial lung disease which had worsened slightly since the prior CT from April, 2008.  She denies known history of lung disease, but does note that she becomes winded with walking.  She denies smoking history. She reports that chest pain is constant and is not worsened by exertion, nor improved with rest.   Chest pain is not exacerbated by activity, nor improved with rest.  She notes some improvement with prescription pain medication, but then pain returns when medication wears off.   Evaluation of ED records also notes significant microcytic anemia- Hgb 8.4.  Patient denies black stools.  Has noticed blood after a bowel movement.  Pain improves with pain medication only.  Has seen [facility] and reports that she had neg stress test. Notes + pica (ice).  Menses are irregular and intermittently heavy.  Diet is poor, "I don't eat the way I am supposed to."  Preventive Screening-Counseling &  Management  Alcohol-Tobacco     Alcohol drinks/day: 0     Smoking Status: never  Caffeine-Diet-Exercise     Caffeine use/day: soft drinks all day     Does Patient Exercise: yes     Type of exercise: walking     Times/week: 7  Allergies (verified): No Known Drug Allergies  Past History:  Past Medical History: Bipolar (?nervous breakdown--1985) ?THYROID OVERACTIVE  Past Surgical History: Tubal ligation--1985, 1996 Gall stones removed-1985 Lumpectomy left breast--2001 Bunions removed from both feet Bilateral knee surgery--1987  Family History: ?cancer--father Diabetes--mother HTN--parents  Social History: Occupation: [employer] Single Never Smoked Alcohol use-no Regular exercise-yes Smoking Status:  never Caffeine use/day:  soft drinks all day Does Patient Exercise:  yes  Review of Systems       lost 14 pounds in last 8 months + drowsiness, does not feel rested in AM + abnormal periods.  Physical Exam  General:  38 AA female, awake, alert and in NAD Head:  Normocephalic and atraumatic without obvious abnormalities. No apparent alopecia or balding. Eyes:  PERRLA Mouth:  Oral mucosa and oropharynx without lesions or exudates.  Teeth in good repair. Lungs:  Normal respiratory effort, chest expands symmetrically. Lungs are clear to auscultation, no crackles or wheezes. Heart:  Normal rate and regular  rhythm. S1 and S2 normal without gallop, murmur, click, rub or other extra sounds. Abdomen:  Bowel sounds positive,abdomen soft and non-tender without masses, organomegaly or hernias noted. Extremities:  No clubbing, cyanosis, edema, or deformity noted with normal full range of motion of all joints.   Psych:  Cognition and judgment appear intact. Alert and cooperative with normal attention span and concentration. No apparent delusions, illusions, hallucinations   Impression & Recommendations:  Problem # 1:  CHEST PAIN, ATYPICAL (ICD-786.59) EKG- NSR.  Will plan  referral for stress test- doubt cardiac etiology.  See #3 and  Orders: T-Comprehensive Metabolic Panel ([id number])  Problem # 2:  ANEMIA (ICD-285.9) Assessment: New Will check iron studies, IFOB, add by mouth iron.   Will also refer to GI for evaluation of anemia and reflux symptoms- see #3. Her updated medication list for this problem includes:    Ferrous Sulfate 325 (65 Fe) Mg Tbec (Ferrous sulfate) ..... One tab by mouth three times a day  Problem # 3:  GASTROESOPHAGEAL REFLUX DISEASE (ICD-530.81) Assessment: New  Suspect that patien't complaint of "choking" sensation may be related to GERD.  It is possible that her chest pain is GERD related as well.  She has reportedly completed a cardiac work up which was negative.  Will try to obtain records from cardiology.  Will add protonix. Her updated medication list for this problem includes:    Protonix 40 Mg Solr (Pantoprazole sodium) ..... One tablet by mouth daily  Orders: Gastroenterology Referral (GI) Prescription Created Electronically [phone])  Problem # 4:  HYPERTHYROIDISM (ICD-242.90) Assessment: Comment Only Check thyroid studies.   Orders: T-TSH ([id number])  Problem # 5:  MENORRHAGIA, PERIMENOPAUSAL (ICD-626.8) Assessment: New  Will refer to GYN for further evaluation in setting of severe anemia.  Orders: Gynecologic Referral (Gyn)  Problem # 6:  INTERSTITIAL LUNG DISEASE (ICD-515)  Orders: Pulmonary Referral (Pulmonary)  Complete Medication List: 1)  Lithium Carbonate 300 Mg Tabs (Lithium carbonate) .... Take 1 every morning and 2 at bedtime. 2)  Seroquel 300 Mg Tabs (Quetiapine fumarate) .... 1/2 to 1 at bedtime. 3)  Protonix 40 Mg Solr (Pantoprazole sodium) .... One tablet by mouth daily 4)  Ferrous Sulfate 325 (65 Fe) Mg Tbec (Ferrous sulfate) .... One tab by mouth three times a day  Other Orders: T-CBC w/Diff [phone]) T-T4, Free [phone]) T-T3, Free [phone])  Patient Instructions: 1)   You will be contacted about your referral to Gastroenterology and GYN. 2)  You will be contacted about your referral to Pulmonary. 3)  Please complete your lab work downstairs today prior to leaving. 4)  Stop eating ice! 5)  Please follow up in 1 month. Prescriptions: PROTONIX 40 MG SOLR (PANTOPRAZOLE SODIUM) one tablet by mouth daily  #30 x 2   Entered and Authorized by:   Lemont Fillers FNP   Signed by:   Lemont Fillers FNP on 02/11/2010   Method used:   Electronically to        [facility] [phone]* (retail)       [address]       [place], [place]  [address]       Ph: [phone]       Fax: [phone]   RxID:   [id number]    Immunization History:  Tetanus/Td Immunization History:    Tetanus/Td:  historical (11/16/2009)    Preventive Care Screening  Mammogram:    Date:  11/16/2009    Results:  cyst(s) left   Last  Tetanus Booster:    Date:  11/16/2009    Results:  Historical   Pap Smear:    Date:  10/17/2006    Results:  normal    Current Allergies (reviewed today): No known allergies

## 2010-09-17 NOTE — Assessment & Plan Note (Signed)
Summary: MONTHLY B12 SHOT...LSW.  Nurse Visit   Allergies: No Known Drug Allergies  Medication Administration  Injection # 1:    Medication: Vit B12 1000 mcg    Diagnosis: B12 DEFICIENCY (ICD-266.2)    Route: IM    Site: L deltoid    Exp Date: 02/2012    Lot #: [id number]    Mfr: [employer]    Patient tolerated injection without complications    Given by: Milford Cage NCMA (May 22, 2010 11:03 AM)  Orders Added: 1)  Vit B12 1000 mcg [J3420]

## 2010-09-17 NOTE — Letter (Signed)
Summary: Primary Care Consult Scheduled Letter  [place] [facility]  [address]   [place], [place] [address]   Phone: [phone]  Fax: [phone]      02/12/2010 MRN: [id number]  Lynn Gonzalez [address] [place], [place]  [address]    Dear [name],      We have scheduled an appointment for you.  At the recommendation of MELISSA O'SULLIVAN,FNP, we have scheduled you a consult with DR Lanny Cramp PULMONARY on March 01, 2010 at 9:30AM.  Their address is_[address], [place] . The office phone number is [phone].  If this appointment day and time is not convenient for you, please feel free to call the office of the doctor you are being referred to at the number listed above and reschedule the appointment.     It is important for you to keep your scheduled appointments. We are here to make sure you are given good patient care. If you have questions or you have made changes to your appointment, please notify us at  [phone], ask for HELEN.    Thank you,  Patient Care Coordinator [place] at [facility]

## 2010-09-17 NOTE — Progress Notes (Signed)
Summary: Triage  Phone Note Call from Patient Call back at Home Phone [phone]   Caller: Patient Call For: Dr Jarold Motto Reason for Call: Talk to Nurse Details for Reason: Triage Summary of Call: States arm is sore and swollen after receiving B12 shot last week. Initial call taken by: Dwan Bolt,  April 30, 2010 4:28 PM  Follow-up for Phone Call        Pt. had B12 inj. on 9/711 noticed a lump at site in rt. upper arm along with itching and soreness.Applied ice but it did not help.More soreness noticed at night.N.P will evaluate  tomorrow morning in office at 11 a.m. Follow-up by: Teryl Lucy RN,  April 30, 2010 4:42 PM  Additional Follow-up for Phone Call Additional follow up Details #1::        I agree.. Additional Follow-up by: Mardella Layman MD FACG,  April 30, 2010 5:36 PM

## 2010-09-17 NOTE — Assessment & Plan Note (Signed)
Summary: 2 MONTH FOLLOW UP/MHF--Rm 4   Vital Signs:  Patient profile:   50 year old female Height:      66 inches Weight:      154.50 pounds BMI:     25.03 Temp:     97.9 degrees F oral Pulse rate:   72 / minute Pulse rhythm:   regular Resp:     16 per minute BP sitting:   130 / 80  (right arm) Cuff size:   regular  Vitals Entered By: Mervin Kung CMA Duncan Dull) (July 23, 2010 9:37 AM) Is Patient Diabetic? No Pain Assessment Patient in pain? no      Comments Pt states Seroquel was increased to 300mg  1 tablet at bedtime. Pt agrees all other med doses and directions are correct. [name] CMA [facility]ull)  July 23, 2010 9:44 AM    Primary Care Provider:  Lemont Fillers FNP   History of Present Illness: Lynn Gonzalez is a 50 year old female who presents today  for follow up.  1)Perimenopausal Menorrhagia-  Pt is s/p TAH/BSO 10/17, performed by Dr. Jennette Kettle.  This procedure was complicated by some post-operative vaginal bleeding which required an additional procedure approximately 1 week later.  2) Breast Abscess- this is followed by the breast center.  She had a biopsy performed which was consistent with breast abscess.  She was started on doxycycline on 12/1 for a total of 10 planned days. She has a 2 week f/u apt with them scheduled.   3) Depression- Denies problems with depression.  Feels that this is well controlled.   Allergies (verified): No Known Drug Allergies  Past History:  Past Medical History: Last updated: 02/11/2010 Bipolar (?nervous breakdown--1985) ?THYROID OVERACTIVE  Past Surgical History: Tubal ligation--1985, 1996 Gall stones removed-1985 mediastinoscopy/ sternotomy--2001 Bunions removed from both feet Bilateral knee surgery--1987 Total hysterectomy-- 06/03/10  Review of Systems       see HPI  Physical Exam  General:  Well-developed,well-nourished,in no acute distress; alert,appropriate and cooperative throughout  examination Breasts:  L breast with cyst noted at 11 oclock, approximately 1 cm wide, firm, no drainage or erythema.  Smaller scabbed area adjacent to cyst which is site of biopsy per patient.   Lungs:  Normal respiratory effort, chest expands symmetrically. Lungs are clear to auscultation, no crackles or wheezes. Heart:  Normal rate and regular rhythm. S1 and S2 normal without gallop, murmur, click, rub or other extra sounds.   Impression & Recommendations:  Problem # 1:  ANEMIA (ICD-285.9) Assessment Comment Only Should be improved now that she no longer has menstrual loss.  Will repeat B12 level.  Her B12 was borderline low.  May be able to hold off of b12 injections pending level.   Her updated medication list for this problem includes:    Ferrous Sulfate 325 (65 Fe) Mg Tbec (Ferrous sulfate) ..... One tab by mouth three times a day    Cyanocobalamin 1000 Mcg/ml Soln (Cyanocobalamin) ..... Monthly im injections    Folic Acid 1 Mg Tabs (Folic acid) .Marland Kitchen... 1 by mouth once daily  Orders: TLB-CBC Platelet - w/Differential ([id number])  Problem # 2:  BREAST CYST, LEFT (ICD-610.0) Assessment: Improved This is being managed by the breast center.  Pt reports that the cyst is improving, recommended that she complete the doxycycline and keep her scheduled follow up.  Complete Medication List: 1)  Lithium Carbonate 300 Mg Tabs (Lithium carbonate) .... Take 1 every morning and 2 tablets at bedtime. 2)  Seroquel  300 Mg Tabs (Quetiapine fumarate) .... 1/2 tablet by mouth at bedtime 3)  Protonix 40 Mg Solr (Pantoprazole sodium) .... One tablet by mouth daily 4)  Ferrous Sulfate 325 (65 Fe) Mg Tbec (Ferrous sulfate) .... One tab by mouth three times a day 5)  Cyanocobalamin 1000 Mcg/ml Soln (Cyanocobalamin) .... Monthly im injections 6)  Folic Acid 1 Mg Tabs (Folic acid) .Marland Kitchen.. 1 by mouth once daily 7)  Estradiol 2 Mg Tabs (Estradiol) .... Take 1 tablet by mouth once a day. 8)  Doxycycline Hyclate  100 Mg Caps (Doxycycline hyclate) .... One tablet by mouth two times a day x 10 days  Other Orders: T-Vitamin B12 ([id number])  Patient Instructions: 1)  Please complete your blood work today. 2)  Keep your follow up with the breast center to re-evaluate your breast abscess, continue your antibiotics. 3)  Please follow up in 3 months.   Orders Added: 1)  T-Vitamin B12 [[id number]] 2)  TLB-CBC Platelet - w/Differential [[id number]] 3)  Est. Patient Level III [01027]     Current Allergies (reviewed today): No known allergies    Vital Signs:  Patient Profile:   51 year old female Height:     66 inches Weight:      154.50 pounds BMI:     25.03 Temp:     97.9 degrees F oral Pulse rate:   72 / minute Pulse rhythm:   regular Resp:     16 per minute BP sitting:   130 / 80 Cuff size:   regular

## 2010-09-17 NOTE — Assessment & Plan Note (Signed)
Summary: #2 OF 3 weekly B12 inj.266.2/dfs  Nurse Visit   Allergies: No Known Drug Allergies  Medication Administration  Injection # 1:    Medication: Vit B12 1000 mcg    Diagnosis: B12 DEFICIENCY (ICD-266.2)    Route: IM    Site: R deltoid    Exp Date: 5/13    Lot #: [id number]    Mfr: [employer]    Patient tolerated injection without complications    Given by: Lamona Curl CMA (AAMA) (March 15, 2010 3:38 PM)  Orders Added: 1)  Vit B12 1000 mcg [J3420]

## 2010-09-17 NOTE — Letter (Signed)
Summary: Patient Notice-Endo Biopsy Results   Gastroenterology  [address] [place], [place] [address]   Phone: [phone]  Fax: [phone]        March 25, 2010 MRN: [id number]    [facility] [address] [place], [place]  [address]    Dear [name],  I am pleased to inform you that the biopsies taken during your recent endoscopic examination did not show any evidence of cancer upon pathologic examination.Exam for Helicobacter was negative and small bowel biopsy was normal  Additional information/recommendations:  __No further action is needed at this time.  Please follow-up with      your primary care physician for your other healthcare needs.  __ Please call [phone] to schedule a return visit to review      your condition.  _x_ Continue with the treatment plan as outlined on the day of your      exam.  __ You should have a repeat endoscopic examination for this problem              in _ months/years.   Please call us if you are having persistent problems or have questions about your condition that have not been fully answered at this time.  Sincerely,  Mardella Layman MD [facility]  This letter has been electronically signed by your physician.

## 2010-09-17 NOTE — Assessment & Plan Note (Signed)
Summary: MONTHLY B12 SHOT  Nurse Visit   Allergies: No Known Drug Allergies  Medication Administration  Injection # 1:    Medication: Vit B12 1000 mcg    Diagnosis: B12 DEFICIENCY (ICD-266.2)    Route: IM    Site: R deltoid    Exp Date: 03/2012    Lot #: [id number]    Mfr: [employer]    Comments: pt to schedule next monthly b12 at front desk    Patient tolerated injection without complications    Given by: Chales Abrahams CMA (AAMA) (June 24, 2010 10:50 AM)  Orders Added: 1)  Vit B12 1000 mcg [J3420]

## 2010-09-17 NOTE — Miscellaneous (Signed)
Summary: Orders Update/clotest  Clinical Lists Changes  Orders: Added new Test order of TLB-H Pylori Screen Gastric Biopsy ([id number]-CLOTEST) - Signed 

## 2010-09-17 NOTE — Letter (Signed)
   [place] [facility] [address] [place], [place]  [address]  [place] Phone: [phone]      April 19, 2010   [facility] [address] [place], [place] [address]  RE:  LAB RESULTS  Dear  Ms. IOVINE,  The following is an interpretation of your most recent lab tests.  Please take note of any instructions provided or changes to medications that have resulted from your lab work.   CBC:  Stable - no changes needed Your anemia is improving.  Please follow up in October as scheduled.   Sincerely Yours,    Lemont Fillers FNP

## 2010-09-17 NOTE — Assessment & Plan Note (Signed)
Summary: 1 MONTH FOLLOW UP/MHF--Rm 4   Vital Signs:  Patient profile:   50 year old female Height:      66 inches Weight:      148.25 pounds BMI:     24.01 Temp:     98.2 degrees F oral Pulse rate:   72 / minute Pulse rhythm:   regular Resp:     12 per minute BP sitting:   114 / 80  (right arm) Cuff size:   regular CC: Room 4  1 month follow up. Is Patient Diabetic? No Comments Pt no longer takes Vit b12 tablets, gets injection. Lynn Gonzalez CMA Duncan Dull)  March 20, 2010 10:46 AM    Primary Care Provider:  Lemont Fillers FNP  CC:  Room 4  1 month follow up.Marland Kitchen  History of Present Illness: Lynn Gonzalez is a 50 year old female who presents today for follow up of her anemia.  Since last visit she has been evaluated by GI and undergone EGD and Colo which were negative for any sign of ABL.  She has also seen her GYN- Dr. Chevis Pretty and is scheduled for a hysterectomy in October.  (+ history of fibroids).  She has also undergone pulmonary eval with Dr. Vassie Loll- and he is evaluating her for interstitial lung disease.  Patient reports that her energy has improved tremendously since the last visit.  + Menses today, not heavy.  Reports resolution of "choking sensation" since she started protonix. Denies melena or BRBPR.  Allergies (verified): No Known Drug Allergies  Past History:  Past Medical History: Last updated: 02/11/2010 Bipolar (?nervous breakdown--1985) ?THYROID OVERACTIVE  Past Surgical History: Last updated: 03/01/2010 Tubal ligation--1985, 1996 Gall stones removed-1985 mediastinoscopy/ sternotomy--2001 Bunions removed from both feet Bilateral knee surgery--1987  Family History: Last updated: 03/01/2010 ?cancer--father Diabetes--mother HTN--parents Cancer-father  Social History: Last updated: 02/11/2010 Occupation: [employer] Single Never Smoked Alcohol use-no Regular exercise-yes  Risk Factors: Alcohol Use: 0 (02/11/2010) Caffeine Use: soft drinks all day  (02/11/2010) Exercise: yes (02/11/2010)  Risk Factors: Smoking Status: never (03/01/2010)  Review of Systems       Denies SOB or chest pain.  Physical Exam  General:  Well-developed,well-nourished,in no acute distress; alert,appropriate and cooperative throughout examination Head:  Normocephalic and atraumatic without obvious abnormalities. No apparent alopecia or balding. Lungs:  Normal respiratory effort, chest expands symmetrically. Lungs are clear to auscultation, no crackles or wheezes. Heart:  Normal rate and regular rhythm. S1 and S2 normal without gallop, murmur, click, rub or other extra sounds.   Impression & Recommendations:  Problem # 1:  ANEMIA (ICD-285.9) Assessment Improved Clinically improving.  Will plan to repeat CBC.  Continue supplements as noted below.   Her updated medication list for this problem includes:    Ferrous Sulfate 325 (65 Fe) Mg Tbec (Ferrous sulfate) ..... One tab by mouth three times a day    Cyanocobalamin 1000 Mcg/ml Soln (Cyanocobalamin) ..... Monthly im injections    Cyanocobalamin 1000 Mcg/ml Inj Soln (Cyanocobalamin) .Marland Kitchen... 1 cc im weekly x 3 than monthly    Folic Acid 1 Mg Tabs (Folic acid) .Marland Kitchen... 1 by mouth once daily  Hgb: 9.0 (02/11/2010)   Hct: 31.8 (02/11/2010)   Platelets: 304 (02/11/2010) RBC: 4.28 (02/11/2010)   RDW: 18.0 (02/11/2010)   WBC: 8.3 (02/11/2010) MCV: 74.3 (02/11/2010)   MCHC: 28.3 (02/11/2010) Ferritin: 4 (02/12/2010) Iron: 11 (02/12/2010)   TIBC: 438 (02/12/2010)   % Sat: 3 (02/12/2010) B12: 228 (02/12/2010)   Folate: 4.8 (02/12/2010)  TSH: 1.980 (02/11/2010)  Complete Medication List: 1)  Lithium Carbonate 300 Mg Tabs (Lithium carbonate) .... Take 1 every morning and1/2-1 at bedtime. 2)  Seroquel 300 Mg Tabs (Quetiapine fumarate) .... 1/2 to 1 at bedtime. 3)  Protonix 40 Mg Solr (Pantoprazole sodium) .... One tablet by mouth daily 4)  Ferrous Sulfate 325 (65 Fe) Mg Tbec (Ferrous sulfate) .... One tab by mouth  three times a day 5)  Cyanocobalamin 1000 Mcg/ml Soln (Cyanocobalamin) .... Monthly im injections 6)  Moviprep 100 Gm Solr (Peg-kcl-nacl-nasulf-na asc-c) .... As per prep instructions. 7)  Cyanocobalamin 1000 Mcg/ml Inj Soln (Cyanocobalamin) .Marland Kitchen.. 1 cc im weekly x 3 than monthly 8)  Folic Acid 1 Mg Tabs (Folic acid) .Marland Kitchen.. 1 by mouth once daily  Patient Instructions: 1)  Please schedule a follow-up appointment in 2 months. 2)  Call sooner if problems or concerns.  Current Allergies (reviewed today): No known allergies

## 2010-09-17 NOTE — Progress Notes (Signed)
Summary: IFOB not returned  Phone Note Other Incoming   Caller: Clydie Braun @ [facility]  [phone] Summary of Call: Pt has not returned her IFOB kit and will be billed. Pt was referred to GI, do we still need to have pt collect specimen?  Please advise. [name] CMA [facility] Dull)  March 12, 2010 11:55 AM   Follow-up for Phone Call        Does not need to collect IFOB.  thanks Follow-up by: Lemont Fillers FNP,  March 12, 2010 1:09 PM

## 2010-09-17 NOTE — Progress Notes (Signed)
  Phone Note Outgoing Call   Call placed by: Lemont Fillers FNP,  July 24, 2010 1:06 PM Call placed to: Patient Summary of Call: Spoke with patient, advised her to continue iron three times a day and b12 injections.   If no improvement in her H/H in 3 months, will consider hematology referral.  Initial call taken by: Lemont Fillers FNP,  July 24, 2010 1:06 PM

## 2010-09-17 NOTE — Op Note (Signed)
Summary: [facility]  [facility]   Imported By: Lester Silverton 04/10/2010 08:55:11  _____________________________________________________________________  External Attachment:    Type:   Image     Comment:   External Document

## 2010-09-17 NOTE — Miscellaneous (Signed)
Summary: Orders Update  Clinical Lists Changes  Orders: Added new Service order of No Show NS50 (NS50) - Signed 

## 2010-09-17 NOTE — Letter (Signed)
   [place] at [facility] [address] [place], [place]  [address]  [place] Phone: [phone]      May 24, 2010   Lynn Gonzalez [address] [place], [place] [address]  RE:  LAB RESULTS  Dear  Lynn Gonzalez,  The following is an interpretation of your most recent lab tests.  Please take note of any instructions provided or changes to medications that have resulted from your lab work.  You thyroid ultrasound does not show any concerning nodules.  I do not think that your thyroid is contributing to your swallowing/neck discomfort.     Sincerely Yours,    Lemont Fillers FNP  Appended Document:  Mailed.

## 2010-09-17 NOTE — Procedures (Signed)
Summary: Upper Endoscopy  Patient: Lynn Gonzalez Note: All result statuses are Final unless otherwise noted.  Tests: (1) Upper Endoscopy (EGD)   EGD Upper Endoscopy       DONE     [facility]     [address]     [place], [place]  [address]           ENDOSCOPY PROCEDURE REPORT           PATIENT:  Lynn Gonzalez, Lynn Gonzalez  MR#:  [id number]     BIRTHDATE:  August 13, 1961, 49 yrs. old  GENDER:  female           ENDOSCOPIST:  Vania Rea. Jarold Motto, MD, [facility]     Referred by:  Sandford Craze, FNP           PROCEDURE DATE:  03/13/2010     PROCEDURE:  EGD with biopsy     ASA CLASS:  Class II     INDICATIONS:           MEDICATIONS:   There was residual sedation effect present from     prior procedure.     TOPICAL ANESTHETIC:           DESCRIPTION OF PROCEDURE:   After the risks benefits and     alternatives of the procedure were thoroughly explained, informed     consent was obtained.  The LB GIF-H180 [id number] endoscope was     introduced through the mouth and advanced to the second portion of     the duodenum, without limitations.  The instrument was slowly     withdrawn as the mucosa was fully examined.     <<PROCEDUREIMAGES>>           The upper, middle, and distal third of the esophagus were     carefully inspected and no abnormalities were noted. The z-line     was well seen at the GEJ. The endoscope was pushed into the fundus     which was normal including a retroflexed view. The antrum,gastric     body, first and second part of the duodenum were unremarkable.     SMALL BOWEL BIOPSY DONE.    Retroflexed views revealed no     abnormalities.    The scope was then withdrawn from the patient     and the procedure completed.           COMPLICATIONS:  None           ENDOSCOPIC IMPRESSION:     1) Normal EGD     R/O CELIAC DISEASE.     RECOMMENDATIONS:     1) Await biopsy results           REPEAT EXAM:  No           ______________________________     Vania Rea. Jarold Motto, MD,  Clementeen Graham           CC:           n.     eSIGNED:   Vania Rea. Patterson at 03/13/2010 03:08 PM           Binstock, Ivar Drape, [id number]  Note: An exclamation mark (!) indicates a result that was not dispersed into the flowsheet. Document Creation Date: 03/13/2010 3:10 PM _______________________________________________________________________  (1) Order result status: Final Collection or observation date-time: 03/13/2010 15:03 Requested date-time:  Receipt date-time:  Reported date-time:  Referring Physician:   Ordering Physician: Sheryn Bison [phone]) Specimen Source:  Source: Launa Grill Order  Number: [id number] Lab site:

## 2010-09-17 NOTE — Op Note (Signed)
Summary: [facility]  [facility]   Imported By: Lester Cowpens 04/10/2010 08:48:40  _____________________________________________________________________  External Attachment:    Type:   Image     Comment:   External Document

## 2010-09-17 NOTE — Letter (Signed)
Summary: [facility]  [facility]   Imported By: Lanelle Bal 03/04/2010 09:00:43  _____________________________________________________________________  External Attachment:    Type:   Image     Comment:   External Document

## 2010-09-17 NOTE — Assessment & Plan Note (Signed)
Summary: ANEMIA/YF   History of Present Illness Visit Type: Initial Consult Primary GI MD: Sheryn Bison MD FACP FAGA Primary Provider: Lemont Fillers FNP Requesting Provider: Sandford Craze, DO Chief Complaint: Anemia: Iron Def & B12, patient states that she wakes up at night choking, also when she eats History of Present Illness:   50 year old [place] American female referred for evaluation of combined anemia with low serum B12, folate, and iron levels. She is status post hysterectomy, fibroid uterine tumors. She has intermittent rectal bleeding and mild chronic bowel irregularity. Associated with this as occasional crampy left lower quadrant discomfort. She also has chronic acid reflux symptoms managed fairly well with Protonix 40 mg a day. She's not had previous endoscopies, colonoscopy, or barium studies. She is status post cholecystectomy, mediastinoscopy apparently for benign mediastinal tumor, and has had bilateral arthroscopies on her knees for degenerative arthritis. Family history is vague and unclear.   GI Review of Systems    Reports abdominal pain, chest pain, dysphagia with solids, and  loss of appetite.     Location of  Abdominal pain: left side.    Denies acid reflux, belching, bloating, dysphagia with liquids, heartburn, nausea, vomiting, vomiting blood, weight loss, and  weight gain.      Reports constipation and  rectal pain.     Denies anal fissure, black tarry stools, change in bowel habit, diarrhea, diverticulosis, fecal incontinence, heme positive stool, hemorrhoids, irritable bowel syndrome, jaundice, light color stool, liver problems, and  rectal bleeding.    Current Medications (verified): 1)  Lithium Carbonate 300 Mg Tabs (Lithium Carbonate) .... Take 1 Every Morning And1/2-1 At Bedtime. 2)  Seroquel 300 Mg Tabs (Quetiapine Fumarate) .... 1/2 To 1 At Bedtime. 3)  Protonix 40 Mg Solr (Pantoprazole Sodium) .... One Tablet By Mouth Daily 4)  Ferrous  Sulfate 325 (65 Fe) Mg Tbec (Ferrous Sulfate) .... One Tab By Mouth Three Times A Day 5)  Vitamin B-12 1000 Mcg Tabs (Cyanocobalamin) .... One Tablet By Mouth Daily  Allergies (verified): No Known Drug Allergies  Past History:  Past medical, surgical, family and social histories (including risk factors) reviewed for relevance to current acute and chronic problems.  Past Medical History: Reviewed history from 02/11/2010 and no changes required. Bipolar (?nervous breakdown--1985) ?THYROID OVERACTIVE  Past Surgical History: Reviewed history from 03/01/2010 and no changes required. Tubal ligation--1985, 1996 Gall stones removed-1985 mediastinoscopy/ sternotomy--2001 Bunions removed from both feet Bilateral knee surgery--1987  Family History: Reviewed history from 03/01/2010 and no changes required. ?cancer--father Diabetes--mother HTN--parents Cancer-father  Social History: Reviewed history from 02/11/2010 and no changes required. Occupation: [employer] Never Smoked Alcohol use-no Regular exercise-yes  Review of Systems       The patient complains of anemia.  The patient denies allergy/sinus, anxiety-new, arthritis/joint pain, back pain, blood in urine, breast changes/lumps, change in vision, confusion, cough, coughing up blood, depression-new, fainting, fatigue, fever, headaches-new, hearing problems, heart murmur, heart rhythm changes, itching, menstrual pain, muscle pains/cramps, night sweats, nosebleeds, pregnancy symptoms, shortness of breath, skin rash, sleeping problems, sore throat, swelling of feet/legs, swollen lymph glands, thirst - excessive , urination - excessive , urination changes/pain, urine leakage, vision changes, and voice change.    Vital Signs:  Patient profile:   50 year old female Height:      66 inches Weight:      146.25 pounds BMI:     23.69 Pulse rate:   60 / minute Pulse rhythm:   regular BP sitting:   100 /  70  (left arm) Cuff size:    regular  Vitals Entered By: June McMurray CMA Duncan Dull) (March 08, 2010 9:44 AM)  Physical Exam  General:  Well developed, well nourished, no acute distress.healthy appearing.   Head:  Normocephalic and atraumatic. Eyes:  PERRLA, no icterus.exam deferred to patient's ophthalmologist.   Neck:  Supple; no masses or thyromegaly. Lungs:  Clear throughout to auscultation. Heart:  Regular rate and rhythm; no murmurs, rubs,  or bruits. Abdomen:  Soft, nontender and nondistended. No masses, hepatosplenomegaly or hernias noted. Normal bowel sounds. Rectal:  Normal exam.hemocult negative.   Msk:  Symmetrical with no gross deformities. Normal posture. Extremities:  No clubbing, cyanosis, edema or deformities noted. Neurologic:  Alert and  oriented x4;  grossly normal neurologically. Psych:  Alert and cooperative. Normal mood and affect.   Impression & Recommendations:  Problem # 1:  GASTROESOPHAGEAL REFLUX DISEASE (ICD-530.81) Assessment Improved continue daily PPI therapy and endoscopy has been scheduled to exclude Barrett's mucosa. She also will have small bowel biopsy at the time of endoscopy to exclude celiac disease.  Problem # 2:  ANEMIA (ICD-285.9) Assessment: Improved continue iron PA and we'll start B12 parenteral replacement, daily folic acid, and check endoscopy and colonoscopy to exclude chronic GI blood loss.  Problem # 3:  DEPRESSION (ICD-311) Assessment: Improved She apparently is under psychiatric care for severe depression and bipolar disorder and is on lithium and Seroquel.  Patient Instructions: 1)  Please continue current medications.  2)  You are scheduled for an endoscopy and colonoscopy. 3)  Begin B12 injections.  You will need one weekly for 3 weeks.  Then you will need one inj monthly. 4)  The medication list was reviewed and reconciled.  All changed / newly prescribed medications were explained.  A complete medication list was provided to the patient / caregiver. 5)   Copy sent to : Melissa O. Lendell Caprice nurse practitioner 6)  Please continue current medications.  7)  Constipation and Hemorrhoids brochure given.  8)  Colonoscopy and Flexible Sigmoidoscopy brochure given.  9)  Conscious Sedation brochure given.  10)  Upper Endoscopy brochure given.   Appended Document: ANEMIA/YF she needs to be on folic acid 1 mg a day also.  Appended Document: ANEMIA/YF    Clinical Lists Changes  Problems: Added new problem of B12 DEFICIENCY (ICD-266.2) Medications: Added new medication of MOVIPREP 100 GM  SOLR (PEG-KCL-NACL-NASULF-NA ASC-C) As per prep instructions. - Signed Added new medication of CYANOCOBALAMIN 1000 MCG/ML INJ SOLN (CYANOCOBALAMIN) 1 cc Im weekly X 3 than monthly Rx of MOVIPREP 100 GM  SOLR (PEG-KCL-NACL-NASULF-NA ASC-C) As per prep instructions.;  #1 x 0;  Signed;  Entered by: Ashok Cordia RN;  Authorized by: Mardella Layman MD [facility];  Method used: Electronically to [facility] [phone]*, [address], [place], [place]  [address], Ph: [phone], Fax: [phone] Orders: Added new Test order of Colon/Endo (Colon/Endo) - Signed Added new Service order of Vit B12 1000 mcg (D6644) - Signed    Prescriptions: MOVIPREP 100 GM  SOLR (PEG-KCL-NACL-NASULF-NA ASC-C) As per prep instructions.  #1 x 0   Entered by:   Ashok Cordia RN   Authorized by:   Mardella Layman MD [facility]   Signed by:   Ashok Cordia RN on 03/08/2010   Method used:   Electronically to        [facility] [phone]* (retail)       [address]  [place], [place]  [address]       Ph: [phone]       Fax: [phone]   RxID:   [id number]     Medication Administration  Injection # 1:    Medication: Vit B12 1000 mcg    Diagnosis: B12 DEFICIENCY (ICD-266.2)    Route: IM    Site: R deltoid    Exp Date: 5/13    Lot #: [id number]    Mfr: AAP Phar    Patient tolerated injection without complications    Given by: Ashok Cordia RN (March 08, 2010 12:56  PM)  Orders Added: 1)  Colon/Endo [Colon/Endo] 2)  Vit B12 1000 mcg [J3420]  Appended Document: ANEMIA/YF LM for pt to call re meds. (starting folic acid)  Appended Document: ANEMIA/YF Pt notified.   Clinical Lists Changes  Medications: Added new medication of FOLIC ACID 1 MG TABS (FOLIC ACID) 1 by mouth once daily - Signed Rx of FOLIC ACID 1 MG TABS (FOLIC ACID) 1 by mouth once daily;  #30 x 6;  Signed;  Entered by: Ashok Cordia RN;  Authorized by: Mardella Layman MD [facility];  Method used: Electronically to [facility] [phone]*, [address] [place], [place]  [address], Ph: [phone], Fax: [phone]    Prescriptions: FOLIC ACID 1 MG TABS (FOLIC ACID) 1 by mouth once daily  #30 x 6   Entered by:   Ashok Cordia RN   Authorized by:   Mardella Layman MD [facility]   Signed by:   Ashok Cordia RN on 03/08/2010   Method used:   Electronically to        [facility] [address] [phone]* (retail)       [address]       [place], [place]  [address]       Ph: [phone]       Fax: [phone]   RxID:   [id number]

## 2010-09-17 NOTE — Progress Notes (Signed)
Summary: Additional labs  ---- Converted from flag ---- ---- 02/12/2010 9:23 AM, Mervin Kung CMA wrote: Tests have been added with [facility] lab. Pt will pick up IFOB kit tomorrow.  ---- 02/12/2010 8:16 AM, Lemont Fillers FNP wrote: Could we pls add on iron, TIBC, B12, Folate, ferritin to yesterday's labs?  Also, mail IFOB to patient to complete please.  Diagnosis anemia. Thanks ------------------------------

## 2010-09-17 NOTE — Assessment & Plan Note (Signed)
Summary: B12 SHOT  Nurse Visit   Allergies: No Known Drug Allergies  Medication Administration  Injection # 1:    Medication: Vit B12 1000 mcg    Diagnosis: B12 DEFICIENCY (ICD-266.2)    Route: IM    Site: R deltoid    Exp Date: 08/13    Lot #: [id number]    Mfr: [employer]    Patient tolerated injection without complications    Given by: Karen Kitchens Nelson-Smith CMA (AAMA) (July 26, 2010 9:16 AM)  Orders Added: 1)  Vit B12 1000 mcg [J3420]   Medication Administration  Injection # 1:    Medication: Vit B12 1000 mcg    Diagnosis: B12 DEFICIENCY (ICD-266.2)    Route: IM    Site: R deltoid    Exp Date: 08/13    Lot #: [id number]    Mfr: [employer]    Patient tolerated injection without complications    Given by: Lamona Curl CMA (AAMA) (July 26, 2010 9:16 AM)  Orders Added: 1)  Vit B12 1000 mcg [J3420]

## 2010-09-17 NOTE — Procedures (Signed)
Summary: Colonoscopy  Patient: Lynn Gonzalez Note: All result statuses are Final unless otherwise noted.  Tests: (1) Colonoscopy (COL)   COL Colonoscopy           DONE     [facility]     [address]     [place], [place]  [address]           COLONOSCOPY PROCEDURE REPORT           PATIENT:  Lynn, Gonzalez  MR#:  [id number]     BIRTHDATE:  Jan 30, 1961, 49 yrs. old  GENDER:  female     ENDOSCOPIST:  Vania Rea. Jarold Motto, MD, [facility]     REF. BY:  Sandford Craze, FNP     PROCEDURE DATE:  03/13/2010     PROCEDURE:  Diagnostic Colonoscopy     ASA CLASS:  Class II     INDICATIONS:  Iron deficiency anemia     MEDICATIONS:   Fentanyl 100 mcg IV, Versed 10 mg IV           DESCRIPTION OF PROCEDURE:   After the risks benefits and     alternatives of the procedure were thoroughly explained, informed     consent was obtained.  Digital rectal exam was performed and     revealed no abnormalities.   The LB CF-H180AL [id number] endoscope     was introduced through the anus and advanced to the cecum, which     was identified by both the appendix and ileocecal valve, without     limitations.  The quality of the prep was excellent, using     MoviPrep.  The instrument was then slowly withdrawn as the colon     was fully examined.     <<PROCEDUREIMAGES>>           FINDINGS:  Scattered diverticula were found throughout the colon.     No polyps or cancers were seen.  no active bleeding or blood in c.     This was otherwise a normal examination of the colon.   Retroflexed     views in the rectum revealed no abnormalities.    The scope was     then withdrawn from the patient and the procedure completed.           COMPLICATIONS:  None     ENDOSCOPIC IMPRESSION:     1) Diverticula, scattered throughout the colon     2) No polyps or cancers     3) No active bleeding or blood in c     4) Otherwise normal examination     RECOMMENDATIONS:     1) Upper endoscopy will be scheduled     REPEAT  EXAM:  No           ______________________________     Vania Rea. Jarold Motto, MD, Clementeen Graham           CC:           n.     eSIGNED:   Vania Rea. Lucky Trotta at 03/13/2010 02:57 PM           Tofte, Thomson, [id number]  Note: An exclamation mark (!) indicates a result that was not dispersed into the flowsheet. Document Creation Date: 03/13/2010 2:59 PM _______________________________________________________________________  (1) Order result status: Final Collection or observation date-time: 03/13/2010 14:53 Requested date-time:  Receipt date-time:  Reported date-time:  Referring Physician:   Ordering Physician: Sheryn Bison [phone]) Specimen Source:  Source: Launa Grill Order Number: [id number] Lab  site:

## 2010-09-17 NOTE — Letter (Signed)
   [place] at [facility] [address] [place], [place]  [address]  [place] Phone: [phone]      May 17, 2010   [place] [address] [place], [place] [address]  RE:  LAB RESULTS  Dear  Ms. CORDLE,  The following is an interpretation of your most recent lab tests.  Please take note of any instructions provided or changes to medications that have resulted from your lab work.  Hemoccult Test for blood in stool:  negative    Sincerely Yours,    Lemont Fillers FNP  Appended Document:  mailed

## 2010-09-17 NOTE — Letter (Signed)
   [place] at [facility] [address] [place], [place]  [address]  [place] Phone: [phone]      May 22, 2010   Lynn Gonzalez [address] [place], [place] [address]  RE:  LAB RESULTS  Dear  [name],  The following is an interpretation of your most recent lab tests.  Please take note of any instructions provided or changes to medications that have resulted from your lab work.  THYROID STUDIES:  Thyroid studies normal TSH: 0.478     CBC:  Stable - no changes needed   Sincerely Yours,    Lemont Fillers FNP  Appended Document:  Mailed.

## 2010-09-17 NOTE — Progress Notes (Signed)
Summary[facility]  Phone Note Other Incoming   Caller: [facility] Summary of Call: Sent OV, EKG, CT to [facility] [phone] fax Initial call taken by: Lannette Donath,  May 02, 2010 3:22 PM

## 2010-09-17 NOTE — Progress Notes (Signed)
Summary: lab results  Phone Note Outgoing Call   Summary of Call: Pls call patient and let her know that her iron is very low.  She should definitely start the over the counter iron supplement as we discussed and complete IFOB.  I would also like her to add a b 12 vitamin since her b12 level is borderline low. She should keep her follow up appointment in August. Initial call taken by: Lemont Fillers FNP,  February 13, 2010 10:34 AM  Follow-up for Phone Call        Pt advised per  [facility] instructions.  [name] CMA  February 13, 2010 1:46 PM     New/Updated Medications: VITAMIN B-12 1000 MCG TABS (CYANOCOBALAMIN) one tablet by mouth daily

## 2010-09-19 NOTE — Assessment & Plan Note (Signed)
Summary: MONTHLY B12 SHOT/JMS  Nurse Visit   Allergies: No Known Drug Allergies  Medication Administration  Injection # 1:    Medication: Vit B12 1000 mcg    Diagnosis: B12 DEFICIENCY (ICD-266.2)    Route: IM    Site: L deltoid    Exp Date: 05/18/2012    Lot #: [id number]    Mfr: [employer]    Comments: patient instructed to make 1 month B12 appointment with the front desk    Patient tolerated injection without complications    Given by: Darcey Nora RN, CGRN (August 27, 2010 3:34 PM)  Orders Added: 1)  Vit B12 1000 mcg [J3420]

## 2010-09-24 ENCOUNTER — Encounter (INDEPENDENT_AMBULATORY_CARE_PROVIDER_SITE_OTHER): Payer: Self-pay | Admitting: *Deleted

## 2010-09-24 ENCOUNTER — Telehealth (INDEPENDENT_AMBULATORY_CARE_PROVIDER_SITE_OTHER): Payer: Self-pay | Admitting: *Deleted

## 2010-09-24 ENCOUNTER — Encounter (INDEPENDENT_AMBULATORY_CARE_PROVIDER_SITE_OTHER): Payer: Medicare Other

## 2010-09-24 DIAGNOSIS — E538 Deficiency of other specified B group vitamins: Secondary | ICD-10-CM

## 2010-10-03 NOTE — Progress Notes (Signed)
  Phone Note Call from Patient   Summary of Call: Patient in today for Vitamin B 12 injection. She states she does not want any more injections because her arm swelled one time she got it and last time the bandaid broke her arm out. Patient states she will call Dr. Lendell Caprice to discuss this need for injections. No bandaid to her arm today. Initial call taken by: Jesse Fall RN,  September 24, 2010 10:41 AM  Follow-up for Phone Call        ok Follow-up by: Mardella Layman MD [facility],  September 24, 2010 11:04 AM

## 2010-10-03 NOTE — Assessment & Plan Note (Signed)
Summary: MONTHLY B12 SHOTS....LSW  Nurse Visit   Allergies: No Known Drug Allergies  Medication Administration  Injection # 1:    Medication: Vit B12 1000 mcg    Diagnosis: B12 DEFICIENCY (ICD-266.2)    Route: IM    Site: R deltoid    Exp Date: 06/18/2012    Lot #: [id number]    Mfr: [employer]    Patient tolerated injection without complications    Given by: Jesse Fall RN (September 24, 2010 10:38 AM)  Orders Added: 1)  Vit B12 1000 mcg [J3420]

## 2010-10-21 ENCOUNTER — Ambulatory Visit: Payer: Medicare Other | Admitting: Family

## 2010-10-21 ENCOUNTER — Ambulatory Visit (INDEPENDENT_AMBULATORY_CARE_PROVIDER_SITE_OTHER): Payer: Medicare Other | Admitting: Family

## 2010-10-21 ENCOUNTER — Encounter: Payer: Self-pay | Admitting: Family

## 2010-10-21 DIAGNOSIS — K219 Gastro-esophageal reflux disease without esophagitis: Secondary | ICD-10-CM

## 2010-10-21 DIAGNOSIS — E049 Nontoxic goiter, unspecified: Secondary | ICD-10-CM

## 2010-10-21 DIAGNOSIS — T07XXXA Unspecified multiple injuries, initial encounter: Secondary | ICD-10-CM

## 2010-10-22 ENCOUNTER — Other Ambulatory Visit: Payer: Self-pay | Admitting: Internal Medicine

## 2010-10-22 ENCOUNTER — Telehealth: Payer: Self-pay | Admitting: Family

## 2010-10-22 DIAGNOSIS — E0590 Thyrotoxicosis, unspecified without thyrotoxic crisis or storm: Secondary | ICD-10-CM

## 2010-10-23 ENCOUNTER — Encounter (INDEPENDENT_AMBULATORY_CARE_PROVIDER_SITE_OTHER): Payer: Medicare Other

## 2010-10-23 ENCOUNTER — Encounter (INDEPENDENT_AMBULATORY_CARE_PROVIDER_SITE_OTHER): Payer: Self-pay | Admitting: *Deleted

## 2010-10-23 DIAGNOSIS — E538 Deficiency of other specified B group vitamins: Secondary | ICD-10-CM

## 2010-10-25 ENCOUNTER — Ambulatory Visit: Payer: Self-pay | Admitting: Family

## 2010-10-28 ENCOUNTER — Other Ambulatory Visit: Payer: Medicare Other
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
## 2010-10-29 NOTE — Progress Notes (Signed)
Summary: records received  Phone Note Other Incoming   Summary of Call: Records received from [facility] for ER visit of 10/18/10 and forwarded to Provider for review. [name] CMA Duncan Dull)  October 22, 2010 2:04 PM

## 2010-10-29 NOTE — Assessment & Plan Note (Signed)
Summary: MONTHLY B12 SHOTS....JMS  Nurse Visit   Allergies: No Known Drug Allergies  Medication Administration  Injection # 1:    Medication: Vit B12 1000 mcg    Diagnosis: B12 DEFICIENCY (ICD-266.2)    Route: IM    Site: L deltoid    Exp Date: 11/13    Lot #: [id number]    Mfr: [employer]ent    Patient tolerated injection without complications    Given by: Lamona Curl CMA (AAMA) (October 23, 2010 3:03 PM)  Orders Added: 1)  Vit B12 1000 mcg [J3420]

## 2010-10-30 LAB — DIFFERENTIAL
Basophils Absolute: 0 K/uL (ref 0.0–0.1)
Basophils Relative: 0 % (ref 0–1)
Eosinophils Absolute: 0.3 K/uL (ref 0.0–0.7)
Eosinophils Relative: 2 % (ref 0–5)
Lymphocytes Relative: 18 % (ref 12–46)
Lymphs Abs: 2.4 K/uL (ref 0.7–4.0)
Monocytes Absolute: 1 K/uL (ref 0.1–1.0)
Monocytes Relative: 7 % (ref 3–12)
Neutro Abs: 9.6 K/uL — ABNORMAL HIGH (ref 1.7–7.7)
Neutrophils Relative %: 72 % (ref 43–77)

## 2010-10-30 LAB — BASIC METABOLIC PANEL WITH GFR
BUN: 6 mg/dL (ref 6–23)
CO2: 27 meq/L (ref 19–32)
Calcium: 9.2 mg/dL (ref 8.4–10.5)
Chloride: 105 meq/L (ref 96–112)
Creatinine, Ser: 0.7 mg/dL (ref 0.4–1.2)
GFR calc non Af Amer: >60 mL/min
Glucose, Bld: 107 mg/dL — ABNORMAL HIGH (ref 70–99)
Potassium: 3.6 meq/L (ref 3.5–5.1)
Sodium: 135 meq/L (ref 135–145)

## 2010-10-30 LAB — TYPE AND SCREEN
ABO/RH(D): A POS
Antibody Screen: NEGATIVE

## 2010-10-30 LAB — CBC
MCH: 25.9 pg — ABNORMAL LOW (ref 26.0–34.0)
MCHC: 30.8 g/dL (ref 30.0–36.0)
Platelets: 407 10*3/uL — ABNORMAL HIGH (ref 150–400)
RDW: 14.4 % (ref 11.5–15.5)

## 2010-11-03 LAB — CBC
HCT: 27.7 % — ABNORMAL LOW (ref 36.0–46.0)
MCH: 22.2 pg — ABNORMAL LOW (ref 26.0–34.0)
MCV: 73 fL — ABNORMAL LOW (ref 78.0–100.0)
RBC: 3.8 MIL/uL — ABNORMAL LOW (ref 3.87–5.11)
RDW: 18 % — ABNORMAL HIGH (ref 11.5–15.5)
WBC: 6 10*3/uL (ref 4.0–10.5)

## 2010-11-03 LAB — URINALYSIS, ROUTINE W REFLEX MICROSCOPIC
Bilirubin Urine: NEGATIVE
Glucose, UA: NEGATIVE mg/dL
Hgb urine dipstick: NEGATIVE
Protein, ur: NEGATIVE mg/dL

## 2010-11-03 LAB — POCT CARDIAC MARKERS
CKMB, poc: 1.8 ng/mL (ref 1.0–8.0)
Myoglobin, poc: 39 ng/mL (ref 12–200)
Myoglobin, poc: 64.6 ng/mL (ref 12–200)
Troponin i, poc: <0.05 ng/mL (ref 0.00–0.09)

## 2010-11-03 LAB — POCT I-STAT, CHEM 8
Chloride: 104 mEq/L (ref 96–112)
HCT: 31 % — ABNORMAL LOW (ref 36.0–46.0)
Potassium: 3.8 mEq/L (ref 3.5–5.1)

## 2010-11-03 LAB — DIFFERENTIAL
Basophils Absolute: 0 10*3/uL (ref 0.0–0.1)
Lymphocytes Relative: 20 % (ref 12–46)
Lymphs Abs: 1.2 10*3/uL (ref 0.7–4.0)
Monocytes Absolute: 0.6 10*3/uL (ref 0.1–1.0)
Neutrophils Relative %: 68 % (ref 43–77)

## 2010-11-03 LAB — LITHIUM LEVEL: Lithium Lvl: <0.25 mEq/L — ABNORMAL LOW (ref 0.80–1.40)

## 2010-11-03 LAB — POCT PREGNANCY, URINE: Preg Test, Ur: NEGATIVE

## 2010-11-08 ENCOUNTER — Other Ambulatory Visit: Payer: Medicare Other

## 2010-11-20 ENCOUNTER — Other Ambulatory Visit: Payer: Medicare Other

## 2010-11-21 ENCOUNTER — Other Ambulatory Visit: Payer: Medicare Other

## 2010-11-22 LAB — GC/CHLAMYDIA PROBE AMP, GENITAL: GC Probe Amp, Genital: NEGATIVE

## 2010-11-23 LAB — POCT URINALYSIS DIP (DEVICE)
Bilirubin Urine: NEGATIVE
Hgb urine dipstick: NEGATIVE
Nitrite: NEGATIVE
Specific Gravity, Urine: 1.025 (ref 1.005–1.030)
pH: 6 (ref 5.0–8.0)

## 2010-11-25 ENCOUNTER — Ambulatory Visit (INDEPENDENT_AMBULATORY_CARE_PROVIDER_SITE_OTHER): Payer: Medicare Other | Admitting: Gastroenterology

## 2010-11-25 DIAGNOSIS — E538 Deficiency of other specified B group vitamins: Secondary | ICD-10-CM

## 2010-11-25 MED ORDER — CYANOCOBALAMIN 1000 MCG/ML IJ SOLN
1000.0000 ug | INTRAMUSCULAR | Status: DC
  Administered 2010-11-25 – 2011-06-09 (×5): 1000 ug via INTRAMUSCULAR

## 2010-12-02 ENCOUNTER — Other Ambulatory Visit: Payer: Self-pay | Admitting: Family

## 2010-12-23 ENCOUNTER — Ambulatory Visit (INDEPENDENT_AMBULATORY_CARE_PROVIDER_SITE_OTHER): Payer: Medicare Other | Admitting: Gastroenterology

## 2010-12-23 ENCOUNTER — Ambulatory Visit: Payer: Medicare Other

## 2010-12-23 DIAGNOSIS — E538 Deficiency of other specified B group vitamins: Secondary | ICD-10-CM

## 2010-12-26 ENCOUNTER — Encounter: Payer: Self-pay | Admitting: Family

## 2010-12-27 ENCOUNTER — Telehealth: Payer: Self-pay | Admitting: *Deleted

## 2010-12-27 NOTE — Telephone Encounter (Signed)
Received voice message from pt requesting a return call. Left message on machine for pt to return my call.

## 2010-12-30 ENCOUNTER — Ambulatory Visit: Payer: Medicare Other | Admitting: Family

## 2010-12-30 MED ORDER — FOLIC ACID 1 MG PO TABS: 1.0000 mg | ORAL_TABLET | Freq: Every day | ORAL | Status: DC

## 2010-12-30 NOTE — Telephone Encounter (Signed)
Pt requested refill on Folic Acid. States she is currently moving and has misplaced it. Refill sent to pharmacy.

## 2011-01-03 ENCOUNTER — Encounter: Payer: Self-pay | Admitting: Family

## 2011-01-03 ENCOUNTER — Ambulatory Visit: Payer: Medicare Other | Admitting: Family

## 2011-01-03 ENCOUNTER — Other Ambulatory Visit: Payer: Self-pay | Admitting: Family

## 2011-01-03 ENCOUNTER — Ambulatory Visit (HOSPITAL_BASED_OUTPATIENT_CLINIC_OR_DEPARTMENT_OTHER)
Admission: RE | Admit: 2011-01-03 | Discharge: 2011-01-03 | Disposition: A | Discharge status: Home / Self Care | Payer: Medicare Other | Source: Ambulatory Visit | Attending: Family | Admitting: Family

## 2011-01-03 ENCOUNTER — Ambulatory Visit (INDEPENDENT_AMBULATORY_CARE_PROVIDER_SITE_OTHER): Payer: Medicare Other | Admitting: Family

## 2011-01-03 VITALS — BP 128/88 | HR 78 | Temp 98.0°F | Resp 16 | Ht 65.0 in | Wt 162.0 lb

## 2011-01-03 DIAGNOSIS — L0291 Cutaneous abscess, unspecified: Secondary | ICD-10-CM

## 2011-01-03 DIAGNOSIS — L02419 Cutaneous abscess of limb, unspecified: Secondary | ICD-10-CM

## 2011-01-03 DIAGNOSIS — M7989 Other specified soft tissue disorders: Secondary | ICD-10-CM

## 2011-01-03 DIAGNOSIS — L03119 Cellulitis of unspecified part of limb: Secondary | ICD-10-CM

## 2011-01-03 DIAGNOSIS — L0390 Cellulitis, unspecified: Secondary | ICD-10-CM

## 2011-01-03 DIAGNOSIS — M79609 Pain in unspecified limb: Secondary | ICD-10-CM | POA: Insufficient documentation

## 2011-01-03 MED ORDER — CLINDAMYCIN HCL 300 MG PO CAPS: 300.0000 mg | ORAL_CAPSULE | Freq: Three times a day (TID) | ORAL | Status: AC

## 2011-01-03 NOTE — Patient Instructions (Signed)
Call if you notice increased pain, swelling or tenderness of the left leg or if you develop fever. Follow up in 1 week.

## 2011-01-03 NOTE — Progress Notes (Signed)
Subjective:    Patient ID: Lynn Gonzalez, female    DOB: 09-12-1960, 50 y.o.   MRN: [id number]  HPI  Lynn Gonzalez is a 50 yr old female who presents today with chief complaint of left leg pain. Symptoms started 2 weeks ago. Developed "knot" on Wednesday on her left leg.   She states that the pain has been severe and is worsened by bearing weight. Pain is improved by elevation.  She denies recent long travel or immobility.  Denies associated fever.     Review of Systems See HPI  Past Medical History  Diagnosis Date  . Bipolar affective disorder 1985    ?nervous breakdown  . Thyroid disease     ? overactive thyroid    History   Social History  . Marital Status: Single    Spouse Name: N/A    Number of Children: N/A  . Years of Education: N/A   Occupational History  . Not on file.   Social History Main Topics  . Smoking status: Never Smoker   . Smokeless tobacco: Never Used  . Alcohol Use: No  . Drug Use: Not on file  . Sexually Active: Not on file   Other Topics Concern  . Not on file   Social History Narrative   Regular exercise:  Yes    Past Surgical History  Procedure Date  . Tubal ligation 1985, 1996  . Gallbladder surgery 1985    stones removed  . Mediastinoscopy 2001  . Sternotomy 2001  . Bunionectomy     bilateral  . Abdominal hysterectomy 06/03/10    Family History  Problem Relation Age of Onset  . Diabetes Mother   . Hypertension Mother   . Cancer Father     ?type  . Hypertension Father     No Known Allergies  Current Outpatient Prescriptions on File Prior to Visit  Medication Sig Dispense Refill  . cyclobenzaprine (FLEXERIL) 10 MG tablet Take 10 mg by mouth 3 (three) times daily as needed. Pain        . estradiol (ESTRACE) 2 MG tablet Take 2 mg by mouth daily.        . ferrous sulfate 325 (65 FE) MG tablet Take 325 mg by mouth 3 (three) times daily.        . folic acid (FOLVITE) 1 MG tablet Take 1 tablet (1 mg total) by mouth  daily.  30 tablet  1  . HYDROcodone-acetaminophen (NORCO) 5-325 MG per tablet Take 1-2 tablets every 4-6 hours as needed for pain.       Marland Kitchen ibuprofen (ADVIL,MOTRIN) 600 MG tablet Take 600 mg by mouth every 8 (eight) hours as needed. For soreness and pain.       Marland Kitchen lithium 300 MG tablet Take 1 tablet every morning and 2 tablets at bedtime.       . pantoprazole (PROTONIX) 40 MG tablet TAKE 1 TABLET BY MOUTH EVERY DAY  30 tablet  1  . QUEtiapine (SEROQUEL) 300 MG tablet Take 300 mg by mouth at bedtime. Take 1/2 tablet by mouth at bedtime.       Current Facility-Administered Medications on File Prior to Visit  Medication Dose Route Frequency Provider Last Rate Last Dose  . cyanocobalamin ((VITAMIN B-12)) injection 1,000 mcg  1,000 mcg Intramuscular Q30 days Sheryn Bison, MD   1,000 mcg at 12/23/10 1011    BP 128/88  Pulse 78  Temp(Src) 98 F (36.7 C) (Oral)  Resp 16  Ht 5\' 5"  (  1.651 m)  Wt 162 lb 0.6 oz (73.501 kg)  BMI 26.96 kg/m2       Objective:   Physical Exam  Constitutional: She appears well-developed and well-nourished.  Cardiovascular: Normal rate and regular rhythm.   Pulmonary/Chest: Effort normal and breath sounds normal.  Musculoskeletal:       + tender area of induration on the left shin below the knee.  Another smaller area of induration is noted in the anterior mid shin medially.  No significant associated erythema or swelling is noted.   Psychiatric: She has a normal mood and affect. Her speech is normal and behavior is normal. Thought content normal.          Assessment & Plan:

## 2011-01-03 NOTE — Assessment & Plan Note (Signed)
LE doppler was completed to rule out DVT.  Preliminary report is negative.  Will plan to treat for cellulitis with clindamycin.

## 2011-01-10 ENCOUNTER — Ambulatory Visit: Payer: Medicare Other | Admitting: Family

## 2011-01-15 ENCOUNTER — Encounter: Payer: Self-pay | Admitting: Family

## 2011-01-17 ENCOUNTER — Ambulatory Visit (HOSPITAL_BASED_OUTPATIENT_CLINIC_OR_DEPARTMENT_OTHER)
Admission: RE | Admit: 2011-01-17 | Discharge: 2011-01-17 | Disposition: A | Discharge status: Home / Self Care | Payer: Medicare Other | Source: Ambulatory Visit | Attending: Family | Admitting: Family

## 2011-01-17 ENCOUNTER — Encounter: Payer: Self-pay | Admitting: Family

## 2011-01-17 ENCOUNTER — Ambulatory Visit (INDEPENDENT_AMBULATORY_CARE_PROVIDER_SITE_OTHER): Payer: Medicare Other | Admitting: Family

## 2011-01-17 ENCOUNTER — Ambulatory Visit: Payer: Medicare Other | Admitting: Family

## 2011-01-17 VITALS — BP 128/90 | HR 90 | Temp 98.1°F | Resp 16 | Ht 65.0 in | Wt 163.0 lb

## 2011-01-17 DIAGNOSIS — L0390 Cellulitis, unspecified: Secondary | ICD-10-CM

## 2011-01-17 DIAGNOSIS — L02419 Cutaneous abscess of limb, unspecified: Secondary | ICD-10-CM

## 2011-01-17 DIAGNOSIS — R229 Localized swelling, mass and lump, unspecified: Secondary | ICD-10-CM

## 2011-01-17 DIAGNOSIS — L03119 Cellulitis of unspecified part of limb: Secondary | ICD-10-CM

## 2011-01-17 MED ORDER — DOXYCYCLINE HYCLATE 100 MG PO CAPS: 100.0000 mg | ORAL_CAPSULE | Freq: Two times a day (BID) | ORAL | Status: AC

## 2011-01-17 NOTE — Progress Notes (Signed)
Subjective:    Patient ID: Lynn Gonzalez, female    DOB: [birth year], 50 y.o.   MRN: [id number]  HPI  Lynn Gonzalez is a 50 yr old female who presents today for follow up of her cellulitis. Completed clindamycin about 4 days ago.  She reports that she continues to have tenderness overlying the left shin. She denies associated fever. She notes some improvement in the swelling.  Review of Systems    see history of present illness  Past Medical History  Diagnosis Date  . Bipolar affective disorder 1985    ?nervous breakdown  . Thyroid disease     ? overactive thyroid    History   Social History  . Marital Status: Single    Spouse Name: N/A    Number of Children: N/A  . Years of Education: N/A   Occupational History  .      [employer]   Social History Main Topics  . Smoking status: Never Smoker   . Smokeless tobacco: Never Used  . Alcohol Use: No  . Drug Use: Not on file  . Sexually Active: Not on file   Other Topics Concern  . Not on file   Social History Narrative   Regular exercise:  Yes    Past Surgical History  Procedure Date  . Tubal ligation 1985, 1996  . Gallbladder surgery 1985    stones removed  . Mediastinoscopy 2001  . Sternotomy 2001  . Bunionectomy     bilateral  . Abdominal hysterectomy 06/03/10  . Knee surgery 1987    bilateral    Family History  Problem Relation Age of Onset  . Diabetes Mother   . Hypertension Mother   . Cancer Father     ?type  . Hypertension Father     No Known Allergies  Current Outpatient Prescriptions on File Prior to Visit  Medication Sig Dispense Refill  . cyclobenzaprine (FLEXERIL) 10 MG tablet Take 10 mg by mouth 3 (three) times daily as needed. Pain        . estradiol (ESTRACE) 2 MG tablet Take 2 mg by mouth daily.        . folic acid (FOLVITE) 1 MG tablet Take 1 tablet (1 mg total) by mouth daily.  30 tablet  1  . HYDROcodone-acetaminophen (NORCO) 5-325 MG per tablet Take 1-2 tablets every 4-6 hours as  needed for pain.       Marland Kitchen ibuprofen (ADVIL,MOTRIN) 600 MG tablet Take 600 mg by mouth every 8 (eight) hours as needed. For soreness and pain.       Marland Kitchen lithium 300 MG tablet Take 1 tablet every morning and 2 tablets at bedtime.       . meloxicam (MOBIC) 15 MG tablet Take 15 mg by mouth daily. Take with food      . pantoprazole (PROTONIX) 40 MG tablet TAKE 1 TABLET BY MOUTH EVERY DAY  30 tablet  1  . QUEtiapine (SEROQUEL) 300 MG tablet Take 300 mg by mouth at bedtime. Take 1/2 tablet by mouth at bedtime.      . ferrous sulfate 325 (65 FE) MG tablet Take 325 mg by mouth 3 (three) times daily.         Current Facility-Administered Medications on File Prior to Visit  Medication Dose Route Frequency Provider Last Rate Last Dose  . cyanocobalamin ((VITAMIN B-12)) injection 1,000 mcg  1,000 mcg Intramuscular Q30 days Sheryn Bison, MD   1,000 mcg at 12/23/10 1011    BP  128/90  Pulse 90  Temp(Src) 98.1 F (36.7 C) (Oral)  Resp 16  Ht 5\' 5"  (1.651 m)  Wt 163 lb (73.936 kg)  BMI 27.12 kg/m2    Objective:   Physical Exam  Constitutional: She appears well-nourished.  Cardiovascular: Normal rate and regular rhythm.   Pulmonary/Chest: Effort normal and breath sounds normal.  Skin:       Tender firm area of induration overlying left upper shin.    no erythema or swelling is noted       Assessment & Plan:

## 2011-01-17 NOTE — Assessment & Plan Note (Signed)
Improving. Will order x-ray to rule out bony abnormality or osteomyelitis. Will also treat with round of doxycycline. Patient is instructed to follow up in one week.

## 2011-01-17 NOTE — Patient Instructions (Signed)
Please follow up in 1 week.  Complete your x-ray on the first floor.

## 2011-01-20 ENCOUNTER — Ambulatory Visit (INDEPENDENT_AMBULATORY_CARE_PROVIDER_SITE_OTHER): Payer: Medicare Other | Admitting: Gastroenterology

## 2011-01-20 DIAGNOSIS — E538 Deficiency of other specified B group vitamins: Secondary | ICD-10-CM

## 2011-01-24 ENCOUNTER — Encounter: Payer: Self-pay | Admitting: Family

## 2011-01-24 ENCOUNTER — Ambulatory Visit (INDEPENDENT_AMBULATORY_CARE_PROVIDER_SITE_OTHER): Payer: Medicare Other | Admitting: Family

## 2011-01-24 DIAGNOSIS — M79606 Pain in leg, unspecified: Secondary | ICD-10-CM

## 2011-01-24 DIAGNOSIS — M79609 Pain in unspecified limb: Secondary | ICD-10-CM

## 2011-01-24 NOTE — Patient Instructions (Signed)
Please follow up in 1 month. Call sooner if you develop fever, increased pain, swelling.
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
## 2011-02-04 DIAGNOSIS — M79606 Pain in leg, unspecified: Secondary | ICD-10-CM | POA: Insufficient documentation
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
## 2011-02-22 ENCOUNTER — Other Ambulatory Visit: Payer: Self-pay | Admitting: Family

## 2011-02-24 ENCOUNTER — Ambulatory Visit: Payer: Medicare Other | Admitting: Family

## 2011-02-28 ENCOUNTER — Encounter: Payer: Self-pay | Admitting: Family

## 2011-02-28 ENCOUNTER — Ambulatory Visit (INDEPENDENT_AMBULATORY_CARE_PROVIDER_SITE_OTHER): Payer: Medicare Other | Admitting: Family

## 2011-02-28 VITALS — BP 120/80 | HR 90 | Temp 97.8°F | Resp 16 | Wt 164.1 lb

## 2011-02-28 DIAGNOSIS — M79609 Pain in unspecified limb: Secondary | ICD-10-CM

## 2011-02-28 DIAGNOSIS — M79606 Pain in leg, unspecified: Secondary | ICD-10-CM
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
## 2011-02-28 NOTE — Patient Instructions (Signed)
Follow up in 6 months 

## 2011-03-03 ENCOUNTER — Encounter: Payer: Self-pay | Admitting: Family

## 2011-03-03 ENCOUNTER — Ambulatory Visit (INDEPENDENT_AMBULATORY_CARE_PROVIDER_SITE_OTHER): Payer: Medicare Other | Admitting: Family

## 2011-03-03 VITALS — BP 108/70 | HR 72 | Temp 97.8°F | Resp 16 | Wt 164.0 lb

## 2011-03-03 DIAGNOSIS — M7989 Other specified soft tissue disorders: Secondary | ICD-10-CM | POA: Insufficient documentation

## 2011-03-03 DIAGNOSIS — M79672 Pain in left foot: Secondary | ICD-10-CM

## 2011-03-03 DIAGNOSIS — M79609 Pain in unspecified limb: Secondary | ICD-10-CM

## 2011-03-03 LAB — URIC ACID: Uric Acid, Serum: 7.6 mg/dL — ABNORMAL HIGH (ref 2.4–7.0)

## 2011-03-03 MED ORDER — FUROSEMIDE 20 MG PO TABS: ORAL_TABLET | ORAL | Status: DC

## 2011-03-03 NOTE — Patient Instructions (Signed)
Please complete your blood work on the first floor.  Follow up in 2 weeks, sooner if increased pain/swelling.

## 2011-03-03 NOTE — Progress Notes (Signed)
Subjective:    Patient ID: Lynn Gonzalez, female    DOB: [birth year], 50 y.o.   MRN: [id number]  HPI Lynn Gonzalez is a 50 yr old female who presents today with complaint of left lower extremity swelling. Symptoms started 48 hours ago. She describes swelling in both feet, left greater than right. She notes some tenderness over the dorsal aspect of her left foot.  She denies past history of gout.she denies associated shortness of breath or calf pain.   Review of Systems History of present illness  Past Medical History  Diagnosis Date  . Bipolar affective disorder 1985    ?nervous breakdown  . Thyroid disease     ? overactive thyroid    History   Social History  . Marital Status: Single    Spouse Name: N/A    Number of Children: N/A  . Years of Education: N/A   Occupational History  . [employer]   Social History Main Topics  . Smoking status: Never Smoker   . Smokeless tobacco: Never Used  . Alcohol Use: No  . Drug Use: Not on file  . Sexually Active: Not on file   Other Topics Concern  . Not on file   Social History Narrative   singleRegular exercise:  Yes    Past Surgical History  Procedure Date  . Tubal ligation 1985, 1996  . Gallbladder surgery 1985    stones removed  . Mediastinoscopy 2001  . Sternotomy 2001  . Bunionectomy     bilateral  . Abdominal hysterectomy 06/03/10  . Knee surgery 1987    bilateral    Family History  Problem Relation Age of Onset  . Diabetes Mother   . Hypertension Mother   . Cancer Father     ?type  . Hypertension Father     No Known Allergies  Current Outpatient Prescriptions on File Prior to Visit  Medication Sig Dispense Refill  . estradiol (ESTRACE) 2 MG tablet Take 2 mg by mouth daily.        . ferrous sulfate 325 (65 FE) MG tablet Take 325 mg by mouth 3 (three) times daily.        . folic acid (FOLVITE) 1 MG tablet Take 1 tablet (1 mg total) by mouth daily.  30 tablet  1  . lithium 300 MG tablet Take  1 tablet every morning and 2 tablets at bedtime.       . pantoprazole (PROTONIX) 40 MG tablet TAKE 1 TABLET BY MOUTH EVERY DAY  30 tablet  1  . QUEtiapine (SEROQUEL) 300 MG tablet Take 300 mg by mouth at bedtime. Take 1/2 to 1  tablet by mouth at bedtime.       Current Facility-Administered Medications on File Prior to Visit  Medication Dose Route Frequency Provider Last Rate Last Dose  . cyanocobalamin ((VITAMIN B-12)) injection 1,000 mcg  1,000 mcg Intramuscular Q30 days Sheryn Bison, MD   1,000 mcg at 01/20/11 1513    BP 108/70  Pulse 72  Temp(Src) 97.8 F (36.6 C) (Oral)  Resp 16  Wt 164 lb (74.39 kg)  LMP 04/18/2010       Objective:   Physical Exam  Constitutional: She appears well-developed and well-nourished.  Cardiovascular: Normal rate and regular rhythm.   Pulmonary/Chest: Effort normal and breath sounds normal.  Musculoskeletal:       1-2+ left pedal edema, trace edema noted right lower extremity. No significant warmth, no induration.  Assessment & Plan:
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
## 2011-03-04 ENCOUNTER — Telehealth: Payer: Self-pay | Admitting: Family

## 2011-03-04 MED ORDER — COLCHICINE 0.6 MG PO TABS: ORAL_TABLET | ORAL | Status: DC

## 2011-03-04 NOTE — Telephone Encounter (Signed)
Reviewed labs.  Uric acid level is elevated. Plan to treat with Colcrys.  Called pt, notified her of labs and med plan. She verbalizes understanding.

## 2011-03-07 NOTE — Telephone Encounter (Signed)
Pt.notified

## 2011-03-07 NOTE — Telephone Encounter (Signed)
Pt can repeat 24 hours later if symptoms are not resolved.

## 2011-03-07 NOTE — Telephone Encounter (Signed)
Received call from pt wanting clarification of Colcry instructions. Advised pt per documentation. Pt wants to know how soon can she repeat the dose as we called in 6 tablets? Please advise.

## 2011-03-11 ENCOUNTER — Ambulatory Visit: Payer: Medicare Other | Admitting: Family

## 2011-03-12 ENCOUNTER — Ambulatory Visit (HOSPITAL_BASED_OUTPATIENT_CLINIC_OR_DEPARTMENT_OTHER)
Admission: RE | Admit: 2011-03-12 | Discharge: 2011-03-12 | Disposition: A | Discharge status: Home / Self Care | Payer: Medicare Other | Source: Ambulatory Visit | Attending: Family | Admitting: Family

## 2011-03-12 ENCOUNTER — Encounter: Payer: Self-pay | Admitting: Family

## 2011-03-12 ENCOUNTER — Ambulatory Visit (INDEPENDENT_AMBULATORY_CARE_PROVIDER_SITE_OTHER): Payer: Medicare Other | Admitting: Family

## 2011-03-12 VITALS — BP 124/70 | HR 90 | Temp 97.6°F | Resp 16 | Ht 65.0 in | Wt 167.0 lb

## 2011-03-12 DIAGNOSIS — M19019 Primary osteoarthritis, unspecified shoulder: Secondary | ICD-10-CM | POA: Insufficient documentation

## 2011-03-12 DIAGNOSIS — M25511 Pain in right shoulder: Secondary | ICD-10-CM

## 2011-03-12 DIAGNOSIS — M109 Gout, unspecified: Secondary | ICD-10-CM

## 2011-03-12 DIAGNOSIS — W19XXXA Unspecified fall, initial encounter: Secondary | ICD-10-CM

## 2011-03-12 DIAGNOSIS — M25519 Pain in unspecified shoulder: Secondary | ICD-10-CM | POA: Insufficient documentation

## 2011-03-12 MED ORDER — PREDNISONE 10 MG PO TABS: ORAL_TABLET | ORAL | Status: DC

## 2011-03-12 NOTE — Progress Notes (Signed)
Subjective:    Patient ID: Lynn Gonzalez, female    DOB: [birth year], 50 y.o.   MRN: [id number]  HPI  R shoulder pain at night- started Monday night.  Difficulty sleeping.  Symptoms started 3 nights ago after she had an accidental fall which she reports was "not that bad." Denies associated hand pain, neck pain.  She has tried some "pain pills my honey gave me."  This helped only slightly.  Diarrhea-  Symptoms have been intermittent- when she started the colchicine.  Foot is not improved.  Still can't wear my shoe.      Review of Systems    see HPI  Past Medical History  Diagnosis Date  . Bipolar affective disorder 1985    ?nervous breakdown  . Thyroid disease     ? overactive thyroid    History   Social History  . Marital Status: Single    Spouse Name: N/A    Number of Children: N/A  . Years of Education: N/A   Occupational History  . [employer]     [employer]   Social History Main Topics  . Smoking status: Never Smoker   . Smokeless tobacco: Never Used  . Alcohol Use: No  . Drug Use: Not on file  . Sexually Active: Not on file   Other Topics Concern  . Not on file   Social History Narrative   singleRegular exercise:  Yes    Past Surgical History  Procedure Date  . Tubal ligation 1985, 1996  . Gallbladder surgery 1985    stones removed  . Mediastinoscopy 2001  . Sternotomy 2001  . Bunionectomy     bilateral  . Abdominal hysterectomy 06/03/10  . Knee surgery 1987    bilateral    Family History  Problem Relation Age of Onset  . Diabetes Mother   . Hypertension Mother   . Cancer Father     ?type  . Hypertension Father     No Known Allergies  Current Outpatient Prescriptions on File Prior to Visit  Medication Sig Dispense Refill  . colchicine 0.6 MG tablet Take 2 tablets by mouth now, then 1 tablet an hour later. and  6 tablet  0  . estradiol (ESTRACE) 2 MG tablet Take 2 mg by mouth daily.        . ferrous sulfate 325 (65 FE) MG tablet Take 325  mg by mouth 3 (three) times daily.        . folic acid (FOLVITE) 1 MG tablet Take 1 tablet (1 mg total) by mouth daily.  30 tablet  1  . furosemide (LASIX) 20 MG tablet One tablet by mouth once daily as needed for swelling  30 tablet  0  . lithium 300 MG tablet Take 1 tablet every morning and 2 tablets at bedtime.       . pantoprazole (PROTONIX) 40 MG tablet TAKE 1 TABLET BY MOUTH EVERY DAY  30 tablet  1  . QUEtiapine (SEROQUEL) 300 MG tablet Take 1/2 to 1  tablet by mouth at bedtime.       Current Facility-Administered Medications on File Prior to Visit  Medication Dose Route Frequency Pierce Barocio Last Rate Last Dose  . cyanocobalamin ((VITAMIN B-12)) injection 1,000 mcg  1,000 mcg Intramuscular Q30 days Sheryn Bison, MD   1,000 mcg at 01/20/11 1513    BP 124/70  Pulse 90  Temp(Src) 97.6 F (36.4 C) (Oral)  Resp 16  Ht 5\' 5"  (1.651 m)  Wt 167  lb 0.6 oz (75.769 kg)  BMI 27.80 kg/m2  SpO2 99%  LMP 04/18/2010    Objective:   Physical Exam  Constitutional: She appears well-developed and well-nourished.  Musculoskeletal:       + swelling noted of the left foot.  Tender to palpation overlying the dorsal aspect of left foot.  + tenderness to palpation of R shoulder.   Full ROM but + pain with movement.           Assessment & Plan:

## 2011-03-12 NOTE — Assessment & Plan Note (Signed)
She has not had significant improvement in foot pain since she took the colchicine. Colchicine is likely the cause of her diarrhea this week and I suspect it should resolve on its own. I will add a short prednisone taper to help with the pain of her left foot.

## 2011-03-12 NOTE — Assessment & Plan Note (Signed)
Pain started after a fall on Saturday. Right shoulder was x-rayed today and is negative for fracture. Suspect musculoskeletal injury. He recommended conservative care with Tylenol for now. After she completes the prednisone she may use Aleve on a when necessary basis. If no improvement in one month, Will plan referral to sports medicine.

## 2011-03-12 NOTE — Patient Instructions (Signed)
Please complete your x-ray on the first floor. Call if your symptoms worsen of if they do not continue to improve.

## 2011-03-14 ENCOUNTER — Telehealth: Payer: Self-pay | Admitting: Family

## 2011-03-14 NOTE — Telephone Encounter (Signed)
Pt wanted to know if she should take all prednisone once a day or should she space them out. Advised pt per Rx directions to take them at one time each day. Pt voices understanding.

## 2011-03-14 NOTE — Telephone Encounter (Signed)
Patient states that she has questions regarding directions on how/when to take prednisone.

## 2011-03-17 ENCOUNTER — Ambulatory Visit: Payer: Medicare Other | Admitting: Family

## 2011-04-12 ENCOUNTER — Encounter: Payer: Self-pay | Admitting: Family Medicine

## 2011-04-12 ENCOUNTER — Ambulatory Visit (INDEPENDENT_AMBULATORY_CARE_PROVIDER_SITE_OTHER): Payer: Medicare Other | Admitting: Family Medicine

## 2011-04-12 VITALS — BP 116/80 | Temp 98.1°F | Wt 167.0 lb

## 2011-04-12 DIAGNOSIS — R51 Headache: Secondary | ICD-10-CM

## 2011-04-12 DIAGNOSIS — R42 Dizziness and giddiness: Secondary | ICD-10-CM

## 2011-04-12 MED ORDER — BUTALBITAL-APAP-CAFFEINE 50-325-40 MG PO TABS: 1.0000 | ORAL_TABLET | Freq: Four times a day (QID) | ORAL | Status: AC | PRN

## 2011-04-12 NOTE — Progress Notes (Signed)
Subjective:    Patient ID: Lynn Gonzalez, female    DOB: 1961-08-12, 50 y.o.   MRN: [id number]  HPI  Lynn Gonzalez, a 50 y.o. female presents today in the office for the following:    3 day history of headaches, that the patient describes is very significant. They've been limiting her ability to get a good nights rest, and have been relatively constant throughout that time. She denies any photophobia or phonophobia. Headache, has been getting wrose, and feels like her eye might be swelling. No prior history of headaches. No history of migraine headaches. She denies any slurred speech, balance disturbance, focal weakness, tingling, numbness or any other occult neurological changes.  Recently, she has had some episodes where she has felt relatively lightheaded and presyncopal. She is felt on several occasions like she is close to passing out. This has not happened for about a week and is not ongoing currently.  Multiple potential significant medical problems exist including recent significant anemia, iron deficient. B12 deficiency, hyper thyroidism, prediabetes on most recent metabolic panel, chronic prednisone, lithium use as well as chronic atypical antipsychotic use.  No vision changes No light or sound probs Tossed and turned.   CBC:    Component Value Date/Time   WBC 6.8 07/23/2010 2004   HGB 10.6* 07/23/2010 2004   HCT 36.2 07/23/2010 2004   PLT 262 07/23/2010 2004   MCV 89.8 07/23/2010 2004   NEUTROABS 4.2 07/23/2010 2004   LYMPHSABS 1.7 07/23/2010 2004   MONOABS 0.7 07/23/2010 2004   EOSABS 0.2 07/23/2010 2004   BASOSABS 0.1 07/23/2010 2004   Comprehensive Metabolic Panel:    Component Value Date/Time   NA 135 06/16/2010 0103   K 3.6 06/16/2010 0103   CL 105 06/16/2010 0103   CO2 27 06/16/2010 0103   BUN 6 06/16/2010 0103   CREATININE 0.70 06/16/2010 0103   GLUCOSE 107* 06/16/2010 0103   CALCIUM 9.2 06/16/2010 0103   AST 15 02/11/2010 0000   ALT <8 U/L 02/11/2010 0000    ALKPHOS 50 02/11/2010 0000   BILITOT 0.5 02/11/2010 0000   PROT 7.8 02/11/2010 0000   ALBUMIN 4.4 02/11/2010 0000   Lab Results  Component Value Date   TSH 0.478 05/21/2010   Lab Results  Component Value Date   FERRITIN 4* 02/12/2010     The PMH, PSH, Social History, Family History, Medications, and allergies have been reviewed in [facility], and have been updated if relevant.  Review of Systems As above. No fevers or chills. No chest pain, shortness of breath. Eating and drinking normally. Headaches and as above.    Objective:   Physical Exam   Physical Exam  Blood pressure 116/80, temperature 98.1 F (36.7 C), temperature source Oral, weight 167 lb 0.6 oz (75.769 kg), last menstrual period 04/18/2010.  GEN: WDWN, NAD, Non-toxic, A & O x 3 HEENT: Atraumatic, Normocephalic. Neck supple. No masses, No LAD. Ears and Nose: No external deformity. CV: RRR, No M/G/R. No JVD. No thrill. No extra heart sounds. PULM: CTA B, no wheezes, crackles, rhonchi. No retractions. No resp. distress. No accessory muscle use. ABD: S, NT, ND, +BS. No rebound tenderness. No HSM.  EXTR: No c/c/e Neuro: CN 2-12 grossly intact. PERRLA. EOMI. Sensation intact throughout. Str 5/5 all extremities. DTR 2+. No clonus. A and o x 4. Romberg neg. Finger nose neg. Heel -shin neg.  PSYCH: Normally interactive. Conversant. Not depressed or anxious appearing.  Calm demeanor.  Assessment & Plan:   1. Headache  butalbital-acetaminophen-caffeine (FIORICET, ESGIC) 50-325-40 MG per tablet  2. Intermittent lightheadedness     Headaches and poor sleep over the last few days. Neurologically normal on examination. They did this is safe to treat clinically with some p.r.n. Fioricet over the next few days.  I discussed with the patient that her intermittent lightheadedness and feeling like she is going to pass out does need some further attention. We discussed getting labs today, but the patient would rather followup  with Ms. Peggyann Juba next week for reevaluation and obtaining labs in her office, which I think is reasonable. Checking a CBC, followup ferritin, B12, metabolic panel, TSH would be reasonable. I suspect mental health follows her lithium levels, but that has not been done recently, that might be appropriate as well.

## 2011-04-16 ENCOUNTER — Telehealth: Payer: Self-pay | Admitting: Family

## 2011-04-16 NOTE — Telephone Encounter (Signed)
Patient called in stating that she is still having headaches. Pt wanted appt on Friday 8-31.

## 2011-04-16 NOTE — Telephone Encounter (Signed)
Judeth Cornfield can you assist with appt?

## 2011-04-16 NOTE — Telephone Encounter (Signed)
Please call pt and arrange a follow up visit in the next 1 week.

## 2011-04-18 ENCOUNTER — Encounter: Payer: Self-pay | Admitting: Family

## 2011-04-18 ENCOUNTER — Ambulatory Visit (INDEPENDENT_AMBULATORY_CARE_PROVIDER_SITE_OTHER): Payer: Medicare Other | Admitting: Family

## 2011-04-18 ENCOUNTER — Telehealth: Payer: Self-pay | Admitting: Family

## 2011-04-18 ENCOUNTER — Other Ambulatory Visit: Payer: Self-pay | Admitting: Family

## 2011-04-18 DIAGNOSIS — R51 Headache: Secondary | ICD-10-CM

## 2011-04-18 DIAGNOSIS — D649 Anemia, unspecified: Secondary | ICD-10-CM

## 2011-04-18 DIAGNOSIS — Z5181 Encounter for therapeutic drug level monitoring: Secondary | ICD-10-CM

## 2011-04-18 DIAGNOSIS — R55 Syncope and collapse: Secondary | ICD-10-CM

## 2011-04-18 DIAGNOSIS — E0590 Thyrotoxicosis, unspecified without thyrotoxic crisis or storm: Secondary | ICD-10-CM

## 2011-04-18 LAB — CBC WITH DIFFERENTIAL/PLATELET
Basophils Absolute: 0 10*3/uL (ref 0.0–0.1)
Basophils Relative: 0 % (ref 0–1)
HCT: 41.8 % (ref 36.0–46.0)
Lymphocytes Relative: 27 % (ref 12–46)
Neutro Abs: 3.7 10*3/uL (ref 1.7–7.7)
Neutrophils Relative %: 62 % (ref 43–77)
Platelets: 213 10*3/uL (ref 150–400)
RDW: 14.2 % (ref 11.5–15.5)
WBC: 5.9 10*3/uL (ref 4.0–10.5)

## 2011-04-18 LAB — FERRITIN: Ferritin: 16 ng/mL (ref 10–291)

## 2011-04-18 LAB — VITAMIN B12: Vitamin B-12: 379 pg/mL (ref 211–911)

## 2011-04-18 LAB — TSH: TSH: 0.083 u[IU]/mL — ABNORMAL LOW (ref 0.350–4.500)

## 2011-04-18 MED ORDER — SUMATRIPTAN SUCCINATE 50 MG PO TABS: ORAL_TABLET | ORAL | Status: DC

## 2011-04-18 NOTE — Patient Instructions (Signed)
Please complete your lab work on the first floor today. Complete MRI of your brain. You will be contacted about your referral to neurology and about your heart monitor.  Follow up in 1 month.  Go to the ER if you develop recurrent loss of consciousness.

## 2011-04-18 NOTE — Telephone Encounter (Signed)
Pt states that she picked up Ferrous Sulfate today and bottle says 220mg  (65). Currently Rx said 325mg  (65). Left message on voicemail that per Sandford Craze, NP pt may continue 220mg  (65) as given to her by pharmacy and to call if any questions.

## 2011-04-18 NOTE — Progress Notes (Signed)
Subjective:    Patient ID: Lynn Gonzalez, female    DOB: [birth year], 50 y.o.   MRN: [id number]  HPI  [name] is a 50 year old female who presents today with chief complaint of HA.  She reports that she had an episode of LOC on 8/15 while seated on the couch- Apparently her eyes rolled back in her head and she was unresponsive for a short period of time.  Denies associated bowel or bladder loss. There was no report of shaking.  She reports that she has had a 2 month history of headaches which are intermittent.  But for the last 2 weeks she has had left frontal HA which is throbbing.  She notes that her left eye was "puffy".  Denies associated nausea, diplopia, chest pain, palpitations, photophobia or phonophobia. She saw Dr. Dallas Schimke last Saturday and was prescribed fiorcet which she has not filled.    Review of Systems  See HPI  Past Medical History  Diagnosis Date  . Bipolar affective disorder 1985    ?nervous breakdown  . Thyroid disease     ? overactive thyroid    History   Social History  . Marital Status: Single    Spouse Name: N/A    Number of Children: N/A  . Years of Education: N/A   Occupational History  . [employer]     [employer]   Social History Main Topics  . Smoking status: Never Smoker   . Smokeless tobacco: Never Used  . Alcohol Use: No  . Drug Use: Not on file  . Sexually Active: Not on file   Other Topics Concern  . Not on file   Social History Narrative   singleRegular exercise:  Yes    Past Surgical History  Procedure Date  . Tubal ligation 1985, 1996  . Gallbladder surgery 1985    stones removed  . Mediastinoscopy 2001  . Sternotomy 2001  . Bunionectomy     bilateral  . Abdominal hysterectomy 06/03/10  . Knee surgery 1987    bilateral    Family History  Problem Relation Age of Onset  . Diabetes Mother   . Hypertension Mother   . Cancer Father     ?type  . Hypertension Father     No Known Allergies  Current Outpatient  Prescriptions on File Prior to Visit  Medication Sig Dispense Refill  . estradiol (ESTRACE) 2 MG tablet Take 2 mg by mouth daily.        . folic acid (FOLVITE) 1 MG tablet Take 1 tablet (1 mg total) by mouth daily.  30 tablet  1  . lithium 300 MG tablet Take 1 tablet every morning and 2 tablets at bedtime.       . pantoprazole (PROTONIX) 40 MG tablet TAKE 1 TABLET BY MOUTH EVERY DAY  30 tablet  1  . QUEtiapine (SEROQUEL) 300 MG tablet Take 1/2 to 1  tablet by mouth at bedtime.      . butalbital-acetaminophen-caffeine (FIORICET, ESGIC) 50-325-40 MG per tablet Take 1 tablet by mouth every 6 (six) hours as needed for headache.  20 tablet  0  . colchicine 0.6 MG tablet Take 2 tablets by mouth now, then 1 tablet an hour later. and  6 tablet  0  . ferrous sulfate 325 (65 FE) MG tablet Take 325 mg by mouth 3 (three) times daily.         Current Facility-Administered Medications on File Prior to Visit  Medication Dose  Route Frequency Provider Last Rate Last Dose  . cyanocobalamin ((VITAMIN B-12)) injection 1,000 mcg  1,000 mcg Intramuscular Q30 days Sheryn Bison, MD   1,000 mcg at 01/20/11 1513    BP 116/80  Pulse 66  Temp(Src) 97.7 F (36.5 C) (Oral)  Resp 16  Ht 5\' 5"  (1.651 m)  Wt 167 lb 1.3 oz (75.787 kg)  BMI 27.80 kg/m2  LMP 04/18/2010        Objective:   Physical Exam  Constitutional: She is oriented to person, place, and time. She appears well-developed and well-nourished.  HENT:  Head: Normocephalic and atraumatic.  Cardiovascular: Normal rate and regular rhythm.   Pulmonary/Chest: Effort normal and breath sounds normal.  Neurological: She is oriented to person, place, and time. She has normal strength. She is unresponsive. She displays no atrophy and no tremor. No sensory deficit. She exhibits normal muscle tone. She displays no seizure activity.  Psychiatric: She has a normal mood and affect. Her speech is normal and behavior is normal. Judgment and thought content normal.  Cognition and memory are normal.          Assessment & Plan:

## 2011-04-18 NOTE — Telephone Encounter (Signed)
Patient has questions regarding furosemide

## 2011-04-19 ENCOUNTER — Ambulatory Visit (HOSPITAL_BASED_OUTPATIENT_CLINIC_OR_DEPARTMENT_OTHER)
Admission: RE | Admit: 2011-04-19 | Discharge: 2011-04-19 | Disposition: A | Discharge status: Home / Self Care | Payer: Medicare Other | Source: Ambulatory Visit | Attending: Family | Admitting: Family

## 2011-04-19 DIAGNOSIS — R51 Headache: Secondary | ICD-10-CM | POA: Insufficient documentation

## 2011-04-19 DIAGNOSIS — R55 Syncope and collapse: Secondary | ICD-10-CM | POA: Insufficient documentation

## 2011-04-19 DIAGNOSIS — J3489 Other specified disorders of nose and nasal sinuses: Secondary | ICD-10-CM | POA: Insufficient documentation

## 2011-04-19 LAB — LITHIUM LEVEL: Lithium Lvl: 0.93 mEq/L (ref 0.80–1.40)

## 2011-04-21 ENCOUNTER — Telehealth: Payer: Self-pay | Admitting: Family

## 2011-04-21 DIAGNOSIS — E0590 Thyrotoxicosis, unspecified without thyrotoxic crisis or storm: Secondary | ICD-10-CM

## 2011-04-21 DIAGNOSIS — R519 Headache, unspecified: Secondary | ICD-10-CM | POA: Insufficient documentation

## 2011-04-21 DIAGNOSIS — R51 Headache: Secondary | ICD-10-CM | POA: Insufficient documentation

## 2011-04-21 DIAGNOSIS — R55 Syncope and collapse: Secondary | ICD-10-CM | POA: Insufficient documentation

## 2011-04-21 NOTE — Assessment & Plan Note (Addendum)
Hgb is now normal. Continue B12 folic acid and iron supplements.

## 2011-04-21 NOTE — Assessment & Plan Note (Signed)
I have asked neuro to evaluate pt for ? Seizure activity.  Will also refer for event monitor.

## 2011-04-21 NOTE — Assessment & Plan Note (Signed)
Pt apparently has a history of hyper thyroidism in the past, but has been euthyroid most recently.  TSH has dropped to 0.083.  She has seen Dr. Talmage Nap in the past.  Will order an ultrasound of her thyroid and refer back to Dr. Talmage Nap.

## 2011-04-21 NOTE — Assessment & Plan Note (Signed)
50 yr old female with 2 month history of intermittent migraine headaches which have worsened over the last 2 weeks.  MRI reveals:   1. No acute intracranial abnormality or focal lesion to explain  the patient's symptoms.  2. Periventricular white matter changes are slightly advanced for  age. The finding is nonspecific but can be seen in the setting of  chronic microvascular ischemia, a demyelinating process such as  multiple sclerosis, vasculitis, complicated migraine headaches, or  as the sequelae of a prior infectious or inflammatory process.  3. Minimal fluid in the left mastoid air cells. No obstructing  nasopharyngeal lesion is evident.  Will refer to neurology for further evaluation.  I am also concerned about possible seizure activity given her report of LOC 2 weeks ago.

## 2011-04-22 ENCOUNTER — Encounter: Payer: Self-pay | Admitting: Neurology

## 2011-04-22 ENCOUNTER — Telehealth: Payer: Self-pay

## 2011-04-22 NOTE — Telephone Encounter (Signed)
Called pt- reviewed MRI results and plan to refer to neurology. Also reviewed TSH results and plan to refer for ultrasound and Dr. Talmage Nap.  She denies any further syncopal episode.  Reports that she developed nausea with imitrex.  Advised pt to go to the ED if she develops recurrent syncope. She verbalizes understanding.

## 2011-04-22 NOTE — Telephone Encounter (Signed)
Please see in work Public librarian

## 2011-04-23 ENCOUNTER — Telehealth: Payer: Self-pay | Admitting: *Deleted

## 2011-04-23 LAB — T3, FREE: T3, Free: 2.6 pg/mL (ref 2.3–4.2)

## 2011-04-23 NOTE — Telephone Encounter (Signed)
Message copied by Kathi Simpers on Wed Apr 23, 2011 10:36 AM ------      Message from: O'SULLIVAN, MELISSA      Created: Mon Apr 21, 2011  9:09 PM       Could you pls call the lab and ask them to add on free T3 and free T4?

## 2011-04-23 NOTE — Telephone Encounter (Signed)
Per Scott at [facility], tests will be added.

## 2011-04-25 ENCOUNTER — Encounter (INDEPENDENT_AMBULATORY_CARE_PROVIDER_SITE_OTHER): Payer: Medicare Other

## 2011-04-25 DIAGNOSIS — R55 Syncope and collapse: Secondary | ICD-10-CM

## 2011-04-26 ENCOUNTER — Other Ambulatory Visit: Payer: Self-pay | Admitting: Family

## 2011-04-28 ENCOUNTER — Telehealth: Payer: Self-pay | Admitting: *Deleted

## 2011-04-28 ENCOUNTER — Other Ambulatory Visit (HOSPITAL_COMMUNITY): Payer: Medicare Other

## 2011-04-28 NOTE — Telephone Encounter (Signed)
Received call from pt that she is currently wearing an event monitor. Has still been experiencing chest pain mostly at night that wakes her up from sleep with slight shortness of breath. Pt reports no symptoms at present but wants to know who she should call re: the symptoms at night. Please advise.
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
## 2011-04-28 NOTE — Telephone Encounter (Signed)
Notation made on pt's med list that therapy had been completed. Pt has appt tomorrow, will ask her about request at that time.

## 2011-04-28 NOTE — Telephone Encounter (Signed)
Pt denied n/v or diaphoresis when I spoke with her previously.

## 2011-04-29 ENCOUNTER — Emergency Department (HOSPITAL_BASED_OUTPATIENT_CLINIC_OR_DEPARTMENT_OTHER): Payer: Medicare Other

## 2011-04-29 ENCOUNTER — Encounter: Payer: Self-pay | Admitting: Family

## 2011-04-29 ENCOUNTER — Ambulatory Visit (INDEPENDENT_AMBULATORY_CARE_PROVIDER_SITE_OTHER): Payer: Medicare Other | Admitting: Family

## 2011-04-29 ENCOUNTER — Emergency Department (INDEPENDENT_AMBULATORY_CARE_PROVIDER_SITE_OTHER): Payer: Medicare Other

## 2011-04-29 ENCOUNTER — Other Ambulatory Visit: Payer: Self-pay

## 2011-04-29 ENCOUNTER — Encounter (HOSPITAL_BASED_OUTPATIENT_CLINIC_OR_DEPARTMENT_OTHER): Payer: Self-pay

## 2011-04-29 ENCOUNTER — Emergency Department (HOSPITAL_BASED_OUTPATIENT_CLINIC_OR_DEPARTMENT_OTHER)
Admission: EM | Admit: 2011-04-29 | Discharge: 2011-04-29 | Disposition: A | Discharge status: Home / Self Care | Payer: Medicare Other | Attending: Emergency Medicine | Admitting: Emergency Medicine

## 2011-04-29 DIAGNOSIS — R079 Chest pain, unspecified: Secondary | ICD-10-CM

## 2011-04-29 DIAGNOSIS — E0590 Thyrotoxicosis, unspecified without thyrotoxic crisis or storm: Secondary | ICD-10-CM

## 2011-04-29 DIAGNOSIS — N644 Mastodynia: Secondary | ICD-10-CM | POA: Insufficient documentation

## 2011-04-29 DIAGNOSIS — R55 Syncope and collapse: Secondary | ICD-10-CM | POA: Insufficient documentation

## 2011-04-29 DIAGNOSIS — F319 Bipolar disorder, unspecified: Secondary | ICD-10-CM | POA: Insufficient documentation

## 2011-04-29 HISTORY — DX: Neoplasm of unspecified behavior of breast: D49.3

## 2011-04-29 LAB — CBC
HCT: 40.1 % (ref 36.0–46.0)
Hemoglobin: 12.7 g/dL (ref 12.0–15.0)
MCH: 25.3 pg — ABNORMAL LOW (ref 26.0–34.0)
MCHC: 31.7 g/dL (ref 30.0–36.0)
MCV: 80 fL (ref 78.0–100.0)
Platelets: 166 10*3/uL (ref 150–400)
RBC: 5.01 MIL/uL (ref 3.87–5.11)
RDW: 14.5 % (ref 11.5–15.5)

## 2011-04-29 LAB — BASIC METABOLIC PANEL
CO2: 27 mEq/L (ref 19–32)
Calcium: 10.4 mg/dL (ref 8.4–10.5)
Creatinine, Ser: 0.7 mg/dL (ref 0.50–1.10)
GFR calc non Af Amer: >60 mL/min (ref >60)
Glucose, Bld: 92 mg/dL (ref 70–99)

## 2011-04-29 LAB — DIFFERENTIAL
Basophils Absolute: 0 10*3/uL (ref 0.0–0.1)
Eosinophils Absolute: 0.2 10*3/uL (ref 0.0–0.7)
Eosinophils Relative: 2 % (ref 0–5)
Lymphocytes Relative: 27 % (ref 12–46)
Lymphs Abs: 2.1 10*3/uL (ref 0.7–4.0)
Monocytes Absolute: 0.6 10*3/uL (ref 0.1–1.0)

## 2011-04-29 MED ORDER — HYDROCODONE-ACETAMINOPHEN 5-325 MG PO TABS
1.0000 | ORAL_TABLET | Freq: Once | ORAL | Status: AC
  Administered 2011-04-29: 1 via ORAL
  Filled 2011-04-29: qty 1

## 2011-04-29 MED ORDER — IBUPROFEN 600 MG PO TABS: 600.0000 mg | ORAL_TABLET | Freq: Four times a day (QID) | ORAL | Status: AC | PRN

## 2011-04-29 MED ORDER — HYDROCODONE-ACETAMINOPHEN 5-500 MG PO TABS: 1.0000 | ORAL_TABLET | Freq: Four times a day (QID) | ORAL | Status: AC | PRN

## 2011-04-29 NOTE — ED Notes (Signed)
Pt returned to ED

## 2011-04-29 NOTE — ED Provider Notes (Signed)
History     CSN: [id number] Arrival date & time: 04/29/2011  9:12 AM  Chief Complaint  Patient presents with  . Breast Pain   HPI Comments: 50yoF h/o breast tumor (unk details), pw lt breast pain. Pt states she has exp progressively worsening pain Lt breast constantly x 2 months, worse over past few days. Has been seen by PMD previously for same pain and syncopal episode and placed on holter monitor which is currently in place. Denies fever/chills/shortness of breath/cough. Denies chest pain/back pain. There is no radiation of pain to her arm/neck. Denies nausea/diaphoresis. No recent weight loss. Has been taking tylenol and ibuprofen at home without relief. States pain gets so severe that she feels weak and feels like she is going to pass out. Denies syncopal episode.  Joylene John, RN 04/29/2011 09:25     Pt reports left breast pain that has been present x 2 months and worsening over the past 3 days.  Pain is described as severe and similar to pain previously when she had a "breast tumor".  She was seen by PMD this am and sent to ED for further evaluation.        Past Medical History  Diagnosis Date  . Bipolar affective disorder 1985    ?nervous breakdown  . Thyroid disease     ? overactive thyroid  . Breast tumor     Past Surgical History  Procedure Date  . Tubal ligation 1985, 1996  . Gallbladder surgery 1985    stones removed  . Mediastinoscopy 2001  . Sternotomy 2001  . Bunionectomy     bilateral  . Abdominal hysterectomy 06/03/10  . Knee surgery 1987    bilateral    Family History  Problem Relation Age of Onset  . Diabetes Mother   . Hypertension Mother   . Cancer Father     ?type  . Hypertension Father     History  Substance Use Topics  . Smoking status: Never Smoker   . Smokeless tobacco: Never Used  . Alcohol Use: No    OB History    Grav Para Term Preterm Abortions TAB SAB Ect Mult Living                  Review of Systems  All other systems  reviewed and are negative.  except as noted HPI   Physical Exam  BP 142/102  Pulse 67  Temp(Src) 97.3 F (36.3 C) (Oral)  Resp 16  Ht 5\' 6"  (1.676 m)  Wt 167 lb (75.751 kg)  BMI 26.95 kg/m2  SpO2 100%  LMP 04/18/2010  Physical Exam  Nursing note and vitals reviewed. Constitutional: She is oriented to person, place, and time. She appears well-developed.  HENT:  Head: Atraumatic.  Mouth/Throat: Oropharynx is clear and moist.  Eyes: Conjunctivae and EOM are normal. Pupils are equal, round, and reactive to light.  Neck: Normal range of motion. Neck supple.  Cardiovascular: Normal rate, regular rhythm, normal heart sounds and intact distal pulses.   Pulmonary/Chest: Effort normal and breath sounds normal. No respiratory distress. She has no wheezes. She has no rales.       Lt breast without mass/erythema/fluctuance. She has  pain with palpation of breast tissue, states that it reproduces the pain she has been feeling x 2 months  Abdominal: Soft. She exhibits no distension. There is no tenderness. There is no rebound and no guarding.  Musculoskeletal: Normal range of motion.  Neurological: She is alert and oriented to  person, place, and time.  Skin: Skin is warm and dry. No rash noted.  Psychiatric: She has a normal mood and affect.    Results for orders placed during the hospital encounter of 04/29/11  CBC      Component Value Range   WBC 7.8  4.0 - 10.5 (K/uL)   RBC 5.01  3.87 - 5.11 (MIL/uL)   Hemoglobin 12.7  12.0 - 15.0 (g/dL)   HCT 16.1  09.6 - 04.5 (%)   MCV 80.0  78.0 - 100.0 (fL)   MCH 25.3 (*) 26.0 - 34.0 (pg)   MCHC 31.7  30.0 - 36.0 (g/dL)   RDW 40.9  81.1 - 91.4 (%)   Platelets 166  150 - 400 (K/uL)  DIFFERENTIAL      Component Value Range   Neutrophils Relative 63  43 - 77 (%)   Neutro Abs 4.9  1.7 - 7.7 (K/uL)   Lymphocytes Relative 27  12 - 46 (%)   Lymphs Abs 2.1  0.7 - 4.0 (K/uL)   Monocytes Relative 8  3 - 12 (%)   Monocytes Absolute 0.6  0.1 - 1.0  (K/uL)   Eosinophils Relative 2  0 - 5 (%)   Eosinophils Absolute 0.2  0.0 - 0.7 (K/uL)   Basophils Relative 0  0 - 1 (%)   Basophils Absolute 0.0  0.0 - 0.1 (K/uL)       Date: 04/29/2011  Rate: 64  Rhythm: normal sinus rhythm  QRS Axis: normal  Intervals: first degree av block  ST/T Wave abnormalities: normal  Conduction Disutrbances:first-degree A-V block   Narrative Interpretation:   Old EKG Reviewed: interval first degree av block compared to EKG 02/07/10   ED Course  Procedures  MDM  50yoF p/w Lt breast pain. By history and exam this would be quit atypical for ACS. I do not feel a mass or fluctuance. Informed by our radiology staff that U/S breast r/o abscess cannot be performed at [facility]. Will control pain, check labs, EKG. Would expect trop and CK-MB to be negative if not ACS given that her pain has been constant for > 2 months. Pain control. Reassess  Stefano Gaul, MD   12:23 PM  Pt feeling better with vicodin, still has "some breast soreness". Labs reviewed and unremarkable. D/W Pt's NP Peggyann Juba. She will arrange outpatient U/S breast and Mammogram as well. She agrees abscess unlikely and U/S can be arranged as outpatient. D/W Patient, she is comfortable with the plan for f/u  Stefano Gaul, MD   Forbes Cellar, MD 04/29/11 1226

## 2011-04-29 NOTE — Assessment & Plan Note (Signed)
Will refer for diagnostic mammogram and ultrasound.

## 2011-04-29 NOTE — ED Notes (Signed)
Pt reports left breast pain that has been present x 2 months and worsening over the past 3 days.  Pain is described as severe and similar to pain previously when she had a "breast tumor".  She was seen by PMD this am and sent to ED for further evaluation.

## 2011-04-29 NOTE — Telephone Encounter (Signed)
Received refill request for furosemide. Medication not currently on pt's med list. Verified with Sandford Craze, NP that medication is not needed at present and refill was denied.

## 2011-04-29 NOTE — Assessment & Plan Note (Signed)
50 yr old female with report of near syncope.  She is very weak today.  EKG performed today in the office shows NSR.  She is wearing a holter monitor and had an MRI of the brain performed in the end of August which noted age advanced microvascular changes.  She will ultimately need a diagnostic mammogram of the left breast.  She rode the bus here today and I do not feel that it is safe for her to return home in her current condition.  I will refer her to the ED for further evaluation.  She could benefit from basic laboratories to assess for dehydration or anemia as well as a CTA of the chest given her complaint of chest pain and near syncope.  She did have a normal stress test back in 2011.

## 2011-04-29 NOTE — Assessment & Plan Note (Signed)
Also of note, she was noted last visit to have hyperthyroidism.  TSH 0.083.  She is scheduled to see endocrinology for follow up.  It is possible that she suffered a seizure back in August which was the result of thyrotoxicosis and that her dizziness/weakness is also a result of her newly diagnosed hyperthyroidism.

## 2011-04-29 NOTE — Progress Notes (Signed)
Subjective:    Patient ID: Lynn Gonzalez, female    DOB: [birth year], 50 y.o.   MRN: [id number]  HPI  Ms.  Tews is a 50 yr old female who presents today with chief complaint of left breast pain.  Feels like a sharp pain "keeps going across it (the left breast)  Notes that pain is constant for the last 3 days and is worsening. Reports that it "feels like it did when I had my breast cyst".   She reports associated dizziness, "about to pass out trying to cross the street." Denies shortness of breath, nausea, fever, ear pain or current headache.  She notes some clear nasal discharge but denies sinus pain or pressure.  She did note a headache at 3AM this morning.    She has an appointment with Neurology next Tuesday to evaluate for neurological cause for  her previous episode of syncope which occurred at home on 8/15..    She is wearing a monitor since last Friday.  She did nave an MRI of the brain last visit as well which showed periventricular white matter changes.    Review of Systems See HPI  Past Medical History  Diagnosis Date  . Bipolar affective disorder 1985    ?nervous breakdown  . Thyroid disease     ? overactive thyroid    History   Social History  . Marital Status: Single    Spouse Name: N/A    Number of Children: N/A  . Years of Education: N/A   Occupational History  . [employer]     [employer]   Social History Main Topics  . Smoking status: Never Smoker   . Smokeless tobacco: Never Used  . Alcohol Use: No  . Drug Use: Not on file  . Sexually Active: Not on file   Other Topics Concern  . Not on file   Social History Narrative   singleRegular exercise:  Yes    Past Surgical History  Procedure Date  . Tubal ligation 1985, 1996  . Gallbladder surgery 1985    stones removed  . Mediastinoscopy 2001  . Sternotomy 2001  . Bunionectomy     bilateral  . Abdominal hysterectomy 06/03/10  . Knee surgery 1987    bilateral    Family History  Problem Relation  Age of Onset  . Diabetes Mother   . Hypertension Mother   . Cancer Father     ?type  . Hypertension Father     No Known Allergies  Current Outpatient Prescriptions on File Prior to Visit  Medication Sig Dispense Refill  . colchicine 0.6 MG tablet Take 2 tablets by mouth now, then 1 tablet an hour later. and  6 tablet  0  . estradiol (ESTRACE) 2 MG tablet Take 2 mg by mouth daily.        . ferrous sulfate 325 (65 FE) MG tablet Take 325 mg by mouth 3 (three) times daily.        . folic acid (FOLVITE) 1 MG tablet Take 1 tablet (1 mg total) by mouth daily.  30 tablet  1  . lithium 300 MG tablet Take 1 tablet every morning and 2 tablets at bedtime.       . pantoprazole (PROTONIX) 40 MG tablet TAKE 1 TABLET BY MOUTH EVERY DAY  30 tablet  1  . QUEtiapine (SEROQUEL) 300 MG tablet Take 1/2 to 1  tablet by mouth at bedtime.      . SUMAtriptan (IMITREX) 50 MG  tablet Take one tablet by mouth at the start of headache.  May repeat 2 hours later once in 24 hours as needed.  10 tablet  0   Current Facility-Administered Medications on File Prior to Visit  Medication Dose Route Frequency Provider Last Rate Last Dose  . cyanocobalamin ((VITAMIN B-12)) injection 1,000 mcg  1,000 mcg Intramuscular Q30 days Sheryn Bison, MD   1,000 mcg at 01/20/11 1513    BP 126/80  Pulse 72  Temp(Src) 97.8 F (36.6 C) (Oral)  Resp 16  LMP 04/18/2010       Objective:   Physical Exam  Constitutional:       Weak appearing african american female seated in wheel chair.   HENT:  Head: Normocephalic and atraumatic.  Right Ear: Tympanic membrane and ear canal normal.  Left Ear: Tympanic membrane and ear canal normal.  Mouth/Throat: Uvula is midline, oropharynx is clear and moist and mucous membranes are normal.  Cardiovascular: Normal rate and regular rhythm.   No murmur heard. Pulmonary/Chest: Effort normal and breath sounds normal. No respiratory distress. She has no wheezes. She has no rales. She exhibits no  tenderness.  Genitourinary:       L breast tenderness without discrete palpable mass.  No erythema is noted.    Skin: Skin is dry.  Psychiatric: She has a normal mood and affect. Her behavior is normal. Judgment and thought content normal.          Assessment & Plan:  Pt's case was discussed with Dr. Hyman Hopes in the [facility] ED.

## 2011-04-29 NOTE — ED Notes (Signed)
Pt transported to radiology.

## 2011-04-30 ENCOUNTER — Telehealth: Payer: Self-pay | Admitting: Family

## 2011-04-30 NOTE — Telephone Encounter (Signed)
Called pt to follow up. She tells me that she is feeling "a lot better."  Notified her of plans to have her complete diagnostic mammo and ultrasound and to contact us if she has not heard from breast center in the next few days about scheduling. She verbalizes understanding.

## 2011-05-05 ENCOUNTER — Telehealth: Payer: Self-pay | Admitting: *Deleted

## 2011-05-05 NOTE — Telephone Encounter (Signed)
Received call from pt stating she will need to reschedule her appts tomorrow with Dr Modesto Charon and Dr Everardo All. Advised pt she will need to contact their offices directly to reschedule. Pt voices understanding.

## 2011-05-06 ENCOUNTER — Ambulatory Visit: Payer: Medicare Other | Admitting: Endocrinology

## 2011-05-06 ENCOUNTER — Ambulatory Visit: Payer: Medicare Other | Admitting: Neurology

## 2011-05-07 ENCOUNTER — Other Ambulatory Visit: Payer: Medicare Other

## 2011-05-09 ENCOUNTER — Ambulatory Visit (INDEPENDENT_AMBULATORY_CARE_PROVIDER_SITE_OTHER): Payer: Medicare Other | Admitting: Gastroenterology

## 2011-05-09 DIAGNOSIS — E538 Deficiency of other specified B group vitamins: Secondary | ICD-10-CM

## 2011-05-16 ENCOUNTER — Ambulatory Visit (INDEPENDENT_AMBULATORY_CARE_PROVIDER_SITE_OTHER): Payer: Medicare Other | Admitting: Family

## 2011-05-16 ENCOUNTER — Ambulatory Visit: Payer: Medicare Other | Admitting: Family

## 2011-05-16 ENCOUNTER — Encounter: Payer: Self-pay | Admitting: Family

## 2011-05-16 DIAGNOSIS — R0789 Other chest pain: Secondary | ICD-10-CM

## 2011-05-16 DIAGNOSIS — K219 Gastro-esophageal reflux disease without esophagitis: Secondary | ICD-10-CM

## 2011-05-16 DIAGNOSIS — E0590 Thyrotoxicosis, unspecified without thyrotoxic crisis or storm: Secondary | ICD-10-CM

## 2011-05-16 DIAGNOSIS — N644 Mastodynia: Secondary | ICD-10-CM

## 2011-05-16 MED ORDER — FOLIC ACID 1 MG PO TABS: 1.0000 mg | ORAL_TABLET | Freq: Every day | ORAL | Status: DC

## 2011-05-16 MED ORDER — PANTOPRAZOLE SODIUM 40 MG PO TBEC: 40.0000 mg | DELAYED_RELEASE_TABLET | Freq: Every day | ORAL | Status: DC

## 2011-05-16 MED ORDER — FERROUS SULFATE 325 (65 FE) MG PO TABS: 325.0000 mg | ORAL_TABLET | Freq: Three times a day (TID) | ORAL | Status: DC

## 2011-05-16 NOTE — Assessment & Plan Note (Signed)
This is resolved

## 2011-05-16 NOTE — Patient Instructions (Signed)
Please keep your appointment with neurology and complete your mammogram as scheduled. Follow up in 3 months, sooner if concerns.

## 2011-05-16 NOTE — Assessment & Plan Note (Signed)
This is resolved, I have instructed her to follow through however with her mammogram appointment.

## 2011-05-16 NOTE — Assessment & Plan Note (Signed)
Stable on protonix

## 2011-05-16 NOTE — Progress Notes (Signed)
  Subjective:    Patient ID: Lynn Gonzalez, female    DOB: September 01, 1960, 50 y.o.   MRN: [id number]  HPI  [name] is a 50 yr old female who presents today for follow up.  1) Chest pain- this resolved, she has mammogram scheduled.  She turned her holter monitor yesterday.    2) Syncope- she is scheduled to see Neurology in 2 weeks, denies any further syncope or near syncope.  3) GERD- reports that this is well controlled on the protonix.    Review of Systems See HPI  Past Medical History  Diagnosis Date  . Bipolar affective disorder 1985    ?nervous breakdown  . Thyroid disease     ? overactive thyroid  . Breast tumor     History   Social History  . Marital Status: Single    Spouse Name: N/A    Number of Children: N/A  . Years of Education: N/A   Occupational History  . [employer]     [employer]   Social History Main Topics  . Smoking status: Never Smoker   . Smokeless tobacco: Never Used  . Alcohol Use: No  . Drug Use: Not on file  . Sexually Active: Not on file   Other Topics Concern  . Not on file   Social History Narrative   singleRegular exercise:  Yes    Past Surgical History  Procedure Date  . Tubal ligation 1985, 1996  . Gallbladder surgery 1985    stones removed  . Mediastinoscopy 2001  . Sternotomy 2001  . Bunionectomy     bilateral  . Abdominal hysterectomy 06/03/10  . Knee surgery 1987    bilateral    Family History  Problem Relation Age of Onset  . Diabetes Mother   . Hypertension Mother   . Cancer Father     ?type  . Hypertension Father     No Known Allergies  Current Outpatient Prescriptions on File Prior to Visit  Medication Sig Dispense Refill  . colchicine 0.6 MG tablet Take 2 tablets by mouth now, then 1 tablet an hour later. and  6 tablet  0  . estradiol (ESTRACE) 2 MG tablet Take 2 mg by mouth daily.        Marland Kitchen lithium 300 MG tablet Take 1 tablet every morning and 2 tablets at bedtime.        Current  Facility-Administered Medications on File Prior to Visit  Medication Dose Route Frequency Provider Last Rate Last Dose  . cyanocobalamin ((VITAMIN B-12)) injection 1,000 mcg  1,000 mcg Intramuscular Q30 days Sheryn Bison, MD   1,000 mcg at 05/09/11 1541    BP 120/86  Pulse 84  Temp(Src) 98.6 F (37 C) (Oral)  Resp 16  Ht 5\' 5"  (1.651 m)  Wt 173 lb 0.6 oz (78.49 kg)  BMI 28.80 kg/m2  LMP 04/18/2010       Objective:   Physical Exam  Constitutional: She appears well-developed and well-nourished.  HENT:  Head: Atraumatic.  Neck: Normal range of motion. Neck supple.  Cardiovascular: Normal rate and regular rhythm.   No murmur heard. Pulmonary/Chest: Effort normal and breath sounds normal. No respiratory distress. She has no wheezes. She has no rales. She exhibits no tenderness.  Skin: She is not diaphoretic.  Psychiatric: She has a normal mood and affect. Her behavior is normal. Judgment and thought content normal.          Assessment & Plan:

## 2011-05-16 NOTE — Assessment & Plan Note (Signed)
She has been referred back to Dr. Talmage Nap.

## 2011-05-30 ENCOUNTER — Encounter: Payer: Self-pay | Admitting: Family

## 2011-05-30 ENCOUNTER — Encounter (HOSPITAL_BASED_OUTPATIENT_CLINIC_OR_DEPARTMENT_OTHER): Payer: Self-pay | Admitting: *Deleted

## 2011-05-30 ENCOUNTER — Ambulatory Visit (INDEPENDENT_AMBULATORY_CARE_PROVIDER_SITE_OTHER): Payer: Medicare Other | Admitting: Family

## 2011-05-30 ENCOUNTER — Emergency Department (HOSPITAL_BASED_OUTPATIENT_CLINIC_OR_DEPARTMENT_OTHER)
Admission: EM | Admit: 2011-05-30 | Discharge: 2011-05-30 | Disposition: A | Discharge status: Home / Self Care | Payer: Medicare Other | Attending: Emergency Medicine | Admitting: Emergency Medicine

## 2011-05-30 ENCOUNTER — Other Ambulatory Visit: Payer: Self-pay

## 2011-05-30 DIAGNOSIS — F319 Bipolar disorder, unspecified: Secondary | ICD-10-CM | POA: Insufficient documentation

## 2011-05-30 DIAGNOSIS — R110 Nausea: Secondary | ICD-10-CM

## 2011-05-30 DIAGNOSIS — R079 Chest pain, unspecified: Secondary | ICD-10-CM

## 2011-05-30 DIAGNOSIS — R0789 Other chest pain: Secondary | ICD-10-CM

## 2011-05-30 MED ORDER — LORAZEPAM 1 MG PO TABS: 1.0000 mg | ORAL_TABLET | Freq: Three times a day (TID) | ORAL | Status: AC | PRN

## 2011-05-30 NOTE — ED Notes (Signed)
Pt sts she recently moved into an apartment and she has found several things wrong in the apartment and thinks mold is growing there. Pt sts she feels sick in the apartment but feels fine once she leaves. Pt was being seen upstairs at [facility] and one of the NP there stated pt was having CP and HI. Pt denies CP and sts that she is mad about her apartment. Pt sts she would not hurt anyone unless they were trying to hurt her.

## 2011-05-30 NOTE — ED Notes (Signed)
Pt is upset and crying. States she wants to break her section 8 housing lease and needs help getting a new place to live. States when she gets upset her chest hurts. Denies SI or HI. Admits to being seen as a pt at mental health and treated for bipolar for years with medications. Has a boyfriend who she says is very supportive. She is alone here today. Ambulatory without difficulty.

## 2011-05-30 NOTE — ED Provider Notes (Addendum)
History     CSN: [id number] Arrival date & time: 05/30/2011  5:20 PM  Chief Complaint  Patient presents with  . Psychiatric Evaluation    (Consider location/radiation/quality/duration/timing/severity/associated sxs/prior treatment) HPI This is a 50 year old black female history bipolar disorder. She recently moved into a new apartment. She notices department is in disrepair area specifically she complains of multiple walls, a broken glaucoma sliding back door and dirty bird her pants on the stove. Her nauseated whenever she is at home. The nausea improves whenever she leaves the apartment even if just stepping outside. She was being seen in her primary care for physicians office. They thought that the patient was having homicidal ideations when asked screening questions about it she feels like hurting anyone. The patient clarifies this by stating that she is angry at her apartment manager and that sometimes she does wish that she can hurt her but knows better and would not actually hurt her or anyone else unless she felt physically threatened. She denies suicidal ideation. She denies chest pain dyspnea and nausea at this time. She states that sometimes she does feel chest pain when she is extremely angry but has not having any chest pain now.  Past Medical History  Diagnosis Date  . Bipolar affective disorder 1985    ?nervous breakdown  . Thyroid disease     ? overactive thyroid  . Breast tumor     Past Surgical History  Procedure Date  . Tubal ligation 1985, 1996  . Gallbladder surgery 1985    stones removed  . Mediastinoscopy 2001  . Sternotomy 2001  . Bunionectomy     bilateral  . Abdominal hysterectomy 06/03/10  . Knee surgery 1987    bilateral    Family History  Problem Relation Age of Onset  . Diabetes Mother   . Hypertension Mother   . Cancer Father     ?type  . Hypertension Father     History  Substance Use Topics  . Smoking status: Never Smoker   . Smokeless  tobacco: Never Used  . Alcohol Use: No    OB History    Grav Para Term Preterm Abortions TAB SAB Ect Mult Living                  Review of Systems  All other systems reviewed and are negative.    Allergies  Review of patient's allergies indicates no known allergies.  Home Medications   Current Outpatient Rx  Name Route Sig Dispense Refill  . ESTRADIOL 2 MG PO TABS Oral Take 2 mg by mouth daily.      Marland Kitchen FERROUS SULFATE 325 (65 FE) MG PO TABS Oral Take 1 tablet (325 mg total) by mouth 3 (three) times daily. 90 tablet 3  . FOLIC ACID 1 MG PO TABS Oral Take 1 tablet (1 mg total) by mouth daily. 30 tablet 5  . LITHIUM CARBONATE 300 MG PO TABS  Take 1 tablet every morning and 2 tablets at bedtime.     Marland Kitchen PANTOPRAZOLE SODIUM 40 MG PO TBEC Oral Take 1 tablet (40 mg total) by mouth daily. 30 tablet 5  . QUETIAPINE FUMARATE 200 MG PO TABS Oral Take 200 mg by mouth at bedtime.      . COLCHICINE 0.6 MG PO TABS  Take 2 tablets by mouth now, then 1 tablet an hour later. and 6 tablet 0    [place] # [id number]    BP 159/121  Pulse 81  Temp(Src) 98.7 F (  37.1 C) (Oral)  Resp 18  SpO2 100%  LMP 04/18/2010  Physical Exam General: Well-developed, well-nourished female in no acute distress; appearance consistent with age of record HENT: normocephalic, atraumatic Eyes: Normal appearance Neck: supple Heart: regular rate and rhythm Lungs: clear to auscultation bilaterally Abdomen: soft; nondistended Extremities: No deformity; full range of motion; pulses normal Neurologic: Awake, alert and oriented;motor function intact in all extremities and symmetric; no facial droop Skin: Warm and dry Psychiatric: No HI, SI; admits to anger but denies any intention to intact on that anger other than through appropriate channels for repair of her apartment flaws   ED Course  Procedures (including critical care time)   MDM   EKG Interpretation:  Date & Time: 05/30/2011 5:46 PM  Rate: 70  Rhythm: normal  sinus rhythm  QRS Axis: normal  Intervals: normal  ST/T Wave abnormalities: normal  Conduction Disutrbances:first-degree A-V block   Narrative Interpretation:   Old EKG Reviewed: unchanged          Hanley Seamen, MD 05/30/11 1936  Hanley Seamen, MD 05/30/11 1941

## 2011-05-30 NOTE — Progress Notes (Signed)
Subjective:    Patient ID: Lynn Gonzalez, female    DOB: 1961-03-02, 50 y.o.   MRN: [id number]  HPI  Lynn Gonzalez is a 50 yr old female who presents today with several concerns.  She moved in on Saturday to her new [address] apartment.  Upon arriving in the apartment she was very unhappy with the conditions that she found.  She tells me that she has found mold and trash in the appartment and that the toilet was unsanitary and the lock was broken on her front door.  She has had nausea and chest pain since she moved in to the apartment.  Thinks that the mold is making her sick. She has not gotten the help that she needs from the Systems analyst.   Her chest pain is left sided and is constant.  Has Chest pain now.  She tells the CMA today that if her housing situation doesn't get fixed she is going to hurt somebody.  Upon further questioning when I asked her if she has a plan as to how she would hurt someone she replies, "Aint no tellin."  She denies active suicide ideation.  Review of Systems See HPI  Past Medical History  Diagnosis Date  . Bipolar affective disorder 1985    ?nervous breakdown  . Thyroid disease     ? overactive thyroid  . Breast tumor     History   Social History  . Marital Status: Single    Spouse Name: N/A    Number of Children: N/A  . Years of Education: N/A   Occupational History  . [employer]     [employer]   Social History Main Topics  . Smoking status: Never Smoker   . Smokeless tobacco: Never Used  . Alcohol Use: No  . Drug Use: Not on file  . Sexually Active: Not on file   Other Topics Concern  . Not on file   Social History Narrative   singleRegular exercise:  Yes    Past Surgical History  Procedure Date  . Tubal ligation 1985, 1996  . Gallbladder surgery 1985    stones removed  . Mediastinoscopy 2001  . Sternotomy 2001  . Bunionectomy     bilateral  . Abdominal hysterectomy 06/03/10  . Knee surgery 1987    bilateral    Family  History  Problem Relation Age of Onset  . Diabetes Mother   . Hypertension Mother   . Cancer Father     ?type  . Hypertension Father     No Known Allergies  Current Outpatient Prescriptions on File Prior to Visit  Medication Sig Dispense Refill  . colchicine 0.6 MG tablet Take 2 tablets by mouth now, then 1 tablet an hour later. and  6 tablet  0  . estradiol (ESTRACE) 2 MG tablet Take 2 mg by mouth daily.        . ferrous sulfate 325 (65 FE) MG tablet Take 1 tablet (325 mg total) by mouth 3 (three) times daily.  90 tablet  3  . folic acid (FOLVITE) 1 MG tablet Take 1 tablet (1 mg total) by mouth daily.  30 tablet  5  . lithium 300 MG tablet Take 1 tablet every morning and 2 tablets at bedtime.       . pantoprazole (PROTONIX) 40 MG tablet Take 1 tablet (40 mg total) by mouth daily.  30 tablet  5  . QUEtiapine (SEROQUEL) 200 MG tablet Take 200 mg by  mouth at bedtime.         Current Facility-Administered Medications on File Prior to Visit  Medication Dose Route Frequency Provider Last Rate Last Dose  . cyanocobalamin ((VITAMIN B-12)) injection 1,000 mcg  1,000 mcg Intramuscular Q30 days Sheryn Bison, MD   1,000 mcg at 05/09/11 1541    BP 148/90  Pulse 84  Temp(Src) 98 F (36.7 C) (Oral)  Resp 16  Ht 5\' 5"  (1.651 m)  Wt 171 lb (77.565 kg)  BMI 28.46 kg/m2  LMP 04/18/2010       Objective:   Physical Exam  Constitutional: She appears well-developed and well-nourished.  Cardiovascular: Normal rate and regular rhythm.   No murmur heard. Pulmonary/Chest: Effort normal and breath sounds normal. No respiratory distress. She has no wheezes. She has no rales. She exhibits no tenderness.  Skin: Skin is warm and dry.  Psychiatric: Her mood appears anxious. Her affect is angry and labile. She expresses impulsivity.       Very tearful and upset during exam upon explaining her housing predicament.          Assessment & Plan:  45 minutes spent with the patient today face to  face.  >50% of this time was spent counseling pt on her depression, chest pain and ?homicidal ideation.  She was ultimately brought to the ED although she initially resisted the idea of being seen in the ED.

## 2011-06-01 NOTE — Assessment & Plan Note (Signed)
EKG was performed here in the office today and is personally reviewed by myself.  No acute changes.

## 2011-06-01 NOTE — Assessment & Plan Note (Signed)
Deteriorated. She seems to be obsessing over her housing situation in an unhealthy way.  I am most concerned about possible homicidal ideation.  Case was reviewed with Dr. Rodena Medin and pt was brought down to the ED for further evaluation.  I gave report directly to the charge nurse.

## 2011-06-03 ENCOUNTER — Ambulatory Visit: Payer: Medicare Other | Admitting: Neurology

## 2011-06-09 ENCOUNTER — Ambulatory Visit (INDEPENDENT_AMBULATORY_CARE_PROVIDER_SITE_OTHER): Payer: Medicare Other | Admitting: Gastroenterology

## 2011-06-09 DIAGNOSIS — E538 Deficiency of other specified B group vitamins: Secondary | ICD-10-CM

## 2011-06-24 ENCOUNTER — Ambulatory Visit: Payer: Medicare Other | Admitting: Endocrinology

## 2011-06-27 ENCOUNTER — Other Ambulatory Visit: Payer: Self-pay

## 2011-06-27 ENCOUNTER — Emergency Department (HOSPITAL_COMMUNITY): Payer: Medicare Other

## 2011-06-27 ENCOUNTER — Emergency Department (HOSPITAL_COMMUNITY)
Admission: EM | Admit: 2011-06-27 | Discharge: 2011-06-27 | Disposition: A | Discharge status: Home / Self Care | Payer: Medicare Other | Attending: Emergency Medicine | Admitting: Emergency Medicine

## 2011-06-27 ENCOUNTER — Encounter (HOSPITAL_COMMUNITY): Payer: Self-pay | Admitting: *Deleted

## 2011-06-27 DIAGNOSIS — R209 Unspecified disturbances of skin sensation: Secondary | ICD-10-CM | POA: Insufficient documentation

## 2011-06-27 DIAGNOSIS — R0789 Other chest pain: Secondary | ICD-10-CM

## 2011-06-27 DIAGNOSIS — R071 Chest pain on breathing: Secondary | ICD-10-CM | POA: Insufficient documentation

## 2011-06-27 DIAGNOSIS — R079 Chest pain, unspecified: Secondary | ICD-10-CM | POA: Insufficient documentation

## 2011-06-27 DIAGNOSIS — E538 Deficiency of other specified B group vitamins: Secondary | ICD-10-CM

## 2011-06-27 HISTORY — DX: Gastro-esophageal reflux disease without esophagitis: K21.9

## 2011-06-27 LAB — BASIC METABOLIC PANEL
BUN: 9 mg/dL (ref 6–23)
CO2: 28 mEq/L (ref 19–32)
Chloride: 105 mEq/L (ref 96–112)
Creatinine, Ser: 0.79 mg/dL (ref 0.50–1.10)
Glucose, Bld: 97 mg/dL (ref 70–99)

## 2011-06-27 LAB — CBC
HCT: 40.8 % (ref 36.0–46.0)
Hemoglobin: 12.5 g/dL (ref 12.0–15.0)
MCH: 25.7 pg — ABNORMAL LOW (ref 26.0–34.0)
MCHC: 30.6 g/dL (ref 30.0–36.0)
MCV: 83.8 fL (ref 78.0–100.0)
Platelets: 208 K/uL (ref 150–400)
RBC: 4.87 MIL/uL (ref 3.87–5.11)
RDW: 15 % (ref 11.5–15.5)
WBC: 7.6 K/uL (ref 4.0–10.5)

## 2011-06-27 LAB — POCT I-STAT TROPONIN I: Troponin i, poc: 0 ng/mL (ref 0.00–0.08)

## 2011-06-27 MED ORDER — GI COCKTAIL ~~LOC~~
30.0000 mL | Freq: Once | ORAL | Status: AC
  Administered 2011-06-27: 30 mL via ORAL
  Filled 2011-06-27: qty 30

## 2011-06-27 MED ORDER — ASPIRIN 325 MG PO TABS
325.0000 mg | ORAL_TABLET | ORAL | Status: AC
  Administered 2011-06-27: 325 mg via ORAL
  Filled 2011-06-27: qty 4

## 2011-06-27 MED ORDER — CYANOCOBALAMIN 1000 MCG/ML IJ SOLN
1000.0000 ug | INTRAMUSCULAR | Status: DC
  Filled 2011-06-27: qty 1

## 2011-06-27 MED ORDER — IBUPROFEN 600 MG PO TABS: 600.0000 mg | ORAL_TABLET | Freq: Four times a day (QID) | ORAL | Status: AC | PRN

## 2011-06-27 MED ORDER — IBUPROFEN 600 MG PO TABS: 600.0000 mg | ORAL_TABLET | Freq: Four times a day (QID) | ORAL | Status: DC | PRN

## 2011-06-27 MED ORDER — ONDANSETRON 4 MG PO TBDP
4.0000 mg | ORAL_TABLET | Freq: Once | ORAL | Status: AC
  Administered 2011-06-27: 4 mg via ORAL
  Filled 2011-06-27: qty 1

## 2011-06-27 MED ORDER — MORPHINE SULFATE 4 MG/ML IJ SOLN
4.0000 mg | Freq: Once | INTRAMUSCULAR | Status: AC
  Administered 2011-06-27: 4 mg via INTRAVENOUS
  Filled 2011-06-27: qty 1

## 2011-06-27 MED ORDER — KETOROLAC TROMETHAMINE 30 MG/ML IJ SOLN
30.0000 mg | Freq: Once | INTRAMUSCULAR | Status: AC
  Administered 2011-06-27: 30 mg via INTRAVENOUS
  Filled 2011-06-27: qty 1

## 2011-06-27 MED ORDER — SODIUM CHLORIDE 0.9 % IV SOLN
Freq: Once | INTRAVENOUS | Status: AC
  Administered 2011-06-27: 16:00:00 via INTRAVENOUS

## 2011-06-27 NOTE — ED Notes (Signed)
Pt states "started having c/p last night, pain 1st went into right arm and then it was in both arms, they tingled, the pain done got so bad I just come on over here"

## 2011-06-27 NOTE — ED Notes (Signed)
Dondra Spry, NP notified of pt's continued pain and new abdominal pain.

## 2011-06-27 NOTE — ED Provider Notes (Signed)
History     CSN: [id number] Arrival date & time: 06/27/2011  1:22 PM   First MD Initiated Contact with Patient 06/27/11 1508      Chief Complaint  Patient presents with  . Chest Pain  . Numbness    (Consider location/radiation/quality/duration/timing/severity/associated sxs/prior treatment) HPI Comments: Developed shharp L chest pain last night that radiated to L arm without nausea, diaphoresis, SOB.  Slept through the nigh but woke with same pain that now radiates to both upper arms with numbness no decrease in ROM  Patient is a 50 y.o. female presenting with chest pain. The history is provided by the patient.  Chest Pain The chest pain began yesterday. Chest pain occurs constantly. The chest pain is improving. At its most intense, the pain is at 8/10. The pain is currently at 8/10. Pertinent negatives for primary symptoms include no fever, no fatigue, no syncope, no shortness of breath, no cough, no palpitations, no abdominal pain and no dizziness.  Associated symptoms include numbness.  Pertinent negatives for associated symptoms include no lower extremity edema, no near-syncope and no weakness. She tried nothing for the symptoms.     Past Medical History  Diagnosis Date  . Bipolar affective disorder 1985    ?nervous breakdown  . Thyroid disease     ? overactive thyroid  . Breast tumor   . GERD (gastroesophageal reflux disease)     Past Surgical History  Procedure Date  . Tubal ligation 1985, 1996  . Gallbladder surgery 1985    stones removed  . Mediastinoscopy 2001  . Sternotomy 2001  . Bunionectomy     bilateral  . Abdominal hysterectomy 06/03/10  . Knee surgery 1987    bilateral    Family History  Problem Relation Age of Onset  . Diabetes Mother   . Hypertension Mother   . Cancer Father     ?type  . Hypertension Father     History  Substance Use Topics  . Smoking status: Never Smoker   . Smokeless tobacco: Never Used  . Alcohol Use: No    OB  History    Grav Para Term Preterm Abortions TAB SAB Ect Mult Living                  Review of Systems  Constitutional: Negative for fever and fatigue.  Respiratory: Negative for cough and shortness of breath.   Cardiovascular: Positive for chest pain. Negative for palpitations, syncope and near-syncope.  Gastrointestinal: Negative for abdominal pain.  Neurological: Positive for numbness. Negative for dizziness and weakness.    Allergies  Review of patient's allergies indicates no known allergies.  Home Medications   Current Outpatient Rx  Name Route Sig Dispense Refill  . ESTRADIOL 2 MG PO TABS Oral Take 2 mg by mouth daily.     Marland Kitchen FERROUS SULFATE 325 (65 FE) MG PO TABS Oral Take 1 tablet (325 mg total) by mouth 3 (three) times daily. 90 tablet 3  . FOLIC ACID 1 MG PO TABS Oral Take 1 tablet (1 mg total) by mouth daily. 30 tablet 5  . LITHIUM CARBONATE 300 MG PO TABS Oral Take 300-600 mg by mouth 2 (two) times daily. Take 1 tablet every morning and 2 tablets at bedtime.    Marland Kitchen PANTOPRAZOLE SODIUM 40 MG PO TBEC Oral Take 1 tablet (40 mg total) by mouth daily. 30 tablet 5  . QUETIAPINE FUMARATE 200 MG PO TABS Oral Take 300 mg by mouth at bedtime.  BP 125/99  Pulse 86  Temp(Src) 98 F (36.7 C) (Oral)  Resp 24  Wt 173 lb (78.472 kg)  SpO2 100%  LMP 04/18/2010  Physical Exam  Constitutional: She is oriented to person, place, and time. She appears well-developed. No distress.  HENT:  Head: Normocephalic.  Eyes: EOM are normal.  Cardiovascular: Normal rate and normal heart sounds.   Pulmonary/Chest: Breath sounds normal. No respiratory distress.  Abdominal: Soft.  Musculoskeletal: Normal range of motion. She exhibits no edema and no tenderness.  Neurological: She is oriented to person, place, and time.  Skin: Skin is warm and dry. She is not diaphoretic.    ED Course  Procedures (including critical care time)   Labs Reviewed  CBC  BASIC METABOLIC PANEL  POCT  CARDIAC MARKERS   No results found.   No diagnosis found.    MDM  ED ECG REPORT   Date: 06/27/2011  EKG Time: 3:46 PM  Rate:84  Rhythm: normal sinus rhythm,    Axis: normal  Intervals:nonspecific intraventricular conduction delay  ST&T  flattened T waves lateral leads  Narrative Interpretation:unchanged from 05/30/11   4:02 PM    will review cardiac markers, CBC, IStat, chest xray      7:19 PM  Reviewed labs ekg, xray all within normal range patient feeling better will DC home with NSAID medication and PCP FU    Arman Filter, NP 06/27/11 1549  Arman Filter, NP 06/27/11 1603  Arman Filter, NP 06/27/11 1921

## 2011-06-27 NOTE — ED Provider Notes (Signed)
Medical screening examination/treatment/procedure(s) were performed by non-physician practitioner and as supervising physician I was immediately available for consultation/collaboration. Devoria Albe, MD, Armando Gang   Ward Givens, MD 06/27/11 2038

## 2011-06-27 NOTE — ED Notes (Signed)
Pt is A/O x4. Skin warm and dry. Respirations even and unlabored. NAD noted at this time.   

## 2011-06-29 ENCOUNTER — Telehealth: Payer: Self-pay | Admitting: Family

## 2011-06-29 NOTE — Telephone Encounter (Signed)
Pls call pt to arrange an ED follow up visit.

## 2011-06-30 NOTE — Telephone Encounter (Signed)
Spoke with pt and scheduled ER f/u for 07/04/11 at 4pm.

## 2011-07-04 ENCOUNTER — Ambulatory Visit: Payer: Medicare Other | Admitting: Family

## 2011-07-14 ENCOUNTER — Telehealth: Payer: Self-pay | Admitting: *Deleted

## 2011-07-14 NOTE — Telephone Encounter (Signed)
Patient called stating she had chest pain yesterday day around 3 pm into her shoulder and arms. She stated today the pain in her arm was not as bad, however her chest pain will not go away. She was advised to be seen in emergency room for care. She stated that she will wait until her appointment tomorrow. She was advised that if her chest pain is not going away she should seek care in the emergency room. Patient verbalized understanding.

## 2011-07-15 ENCOUNTER — Ambulatory Visit (INDEPENDENT_AMBULATORY_CARE_PROVIDER_SITE_OTHER): Payer: 59 | Admitting: Family

## 2011-07-15 ENCOUNTER — Encounter: Payer: Self-pay | Admitting: Family

## 2011-07-15 VITALS — BP 138/96 | HR 78 | Temp 97.8°F | Resp 16 | Wt 174.0 lb

## 2011-07-15 DIAGNOSIS — F319 Bipolar disorder, unspecified: Secondary | ICD-10-CM

## 2011-07-15 DIAGNOSIS — R197 Diarrhea, unspecified: Secondary | ICD-10-CM

## 2011-07-15 DIAGNOSIS — R0789 Other chest pain: Secondary | ICD-10-CM

## 2011-07-15 MED ORDER — ALBUTEROL SULFATE HFA 108 (90 BASE) MCG/ACT IN AERS: 2.0000 | INHALATION_SPRAY | Freq: Four times a day (QID) | RESPIRATORY_TRACT | Status: DC | PRN

## 2011-07-15 NOTE — Patient Instructions (Signed)
Please reschedule your mammogram at your earliest convenience. You will be contacted about your referral to see the therapist.  Follow up in 1 month, sooner if problems or concerns.

## 2011-07-15 NOTE — Progress Notes (Signed)
Subjective:    Patient ID: Lynn Gonzalez, female    DOB: 1960-09-29, 50 y.o.   MRN: [id number]  HPI  Lynn Gonzalez is a 50 yr old female who presents today for ED follow up. ED records are reviewed.  She was seen in the ED on 11/9 for atypical chest pain.  Cardiac enzymes and EKG was WNL. Today she reports a chest tightness on the right side of her chest.  On Sunday she had some numbness in the right upper arm.  Numbness resolved.  Symptoms are worst when she is in her apt.  She has tried advil without improvement.   Diarrhea- she reports that this stated on Saturday.  She had diarrhea until Sunday evening.  This resolved.   Review of Systems See HPI  Past Medical History  Diagnosis Date  . Bipolar affective disorder 1985    ?nervous breakdown  . Thyroid disease     ? overactive thyroid  . Breast tumor   . GERD (gastroesophageal reflux disease)     History   Social History  . Marital Status: Single    Spouse Name: N/A    Number of Children: N/A  . Years of Education: N/A   Occupational History  . [employer]     [employer]   Social History Main Topics  . Smoking status: Never Smoker   . Smokeless tobacco: Never Used  . Alcohol Use: No  . Drug Use: No  . Sexually Active: Not on file   Other Topics Concern  . Not on file   Social History Narrative   singleRegular exercise:  Yes    Past Surgical History  Procedure Date  . Tubal ligation 1985, 1996  . Gallbladder surgery 1985    stones removed  . Mediastinoscopy 2001  . Sternotomy 2001  . Bunionectomy     bilateral  . Abdominal hysterectomy 06/03/10  . Knee surgery 1987    bilateral    Family History  Problem Relation Age of Onset  . Diabetes Mother   . Hypertension Mother   . Cancer Father     ?type  . Hypertension Father     No Known Allergies  Current Outpatient Prescriptions on File Prior to Visit  Medication Sig Dispense Refill  . estradiol (ESTRACE) 2 MG tablet Take 2 mg by mouth daily.         . folic acid (FOLVITE) 1 MG tablet Take 1 tablet (1 mg total) by mouth daily.  30 tablet  5  . lithium 300 MG tablet Take 300-600 mg by mouth 2 (two) times daily. Take 1 tablet every morning and 2 tablets at bedtime.      . pantoprazole (PROTONIX) 40 MG tablet Take 1 tablet (40 mg total) by mouth daily.  30 tablet  5  . QUEtiapine (SEROQUEL) 200 MG tablet Take 300 mg by mouth at bedtime.       . ferrous sulfate 325 (65 FE) MG tablet Take 1 tablet (325 mg total) by mouth 3 (three) times daily.  90 tablet  3   Current Facility-Administered Medications on File Prior to Visit  Medication Dose Route Frequency Provider Last Rate Last Dose  . cyanocobalamin ((VITAMIN B-12)) injection 1,000 mcg  1,000 mcg Intramuscular Q30 days Sheryn Bison, MD   1,000 mcg at 06/09/11 1539    BP 138/96  Pulse 78  Temp(Src) 97.8 F (36.6 C) (Oral)  Resp 16  Wt 174 lb 0.6 oz (78.944 kg)  LMP 04/18/2010  Objective:   Physical Exam  Constitutional: She appears well-developed and well-nourished. No distress.  HENT:  Head: Normocephalic and atraumatic.  Cardiovascular: Normal rate and regular rhythm.   No murmur heard. Pulmonary/Chest: Effort normal and breath sounds normal. No respiratory distress. She has no wheezes. She has no rales. She exhibits no tenderness.  Psychiatric: She has a normal mood and affect. Her behavior is normal. Judgment and thought content normal.          Assessment & Plan:

## 2011-07-19 DIAGNOSIS — R0789 Other chest pain: Secondary | ICD-10-CM | POA: Insufficient documentation

## 2011-07-19 DIAGNOSIS — R197 Diarrhea, unspecified: Secondary | ICD-10-CM | POA: Insufficient documentation

## 2011-07-19 NOTE — Assessment & Plan Note (Addendum)
Pt has had normal myoview, echo, holter.  I suspect pain is related to stress/musculoskeletal.  Reassurance provided.

## 2011-07-19 NOTE — Assessment & Plan Note (Signed)
Resolved

## 2011-07-29 ENCOUNTER — Ambulatory Visit: Payer: 59 | Admitting: Professional

## 2011-08-08 ENCOUNTER — Ambulatory Visit (INDEPENDENT_AMBULATORY_CARE_PROVIDER_SITE_OTHER): Payer: 59 | Admitting: Gastroenterology

## 2011-08-08 DIAGNOSIS — E538 Deficiency of other specified B group vitamins: Secondary | ICD-10-CM

## 2011-08-08 MED ORDER — CYANOCOBALAMIN 1000 MCG/ML IJ SOLN
1000.0000 ug | Freq: Once | INTRAMUSCULAR | Status: AC
  Administered 2011-08-08: 1000 ug via INTRAMUSCULAR

## 2011-08-08 NOTE — Progress Notes (Signed)
I have reviewed chart and advised patient that she has not seen Dr Jarold Motto since 05/01/2010 and she will need an office visit to continue to get her b12 injections in our office, and that she will need to have a yearly office visit for any medications given under Dr Jarold Motto and that includes b12 injections. She states that she goes to see Melissa o'Sullivan all the time and she has never been told she had to see Dr Jarold Motto. I said I know noone has probably told you this before that's why I am letting you know now, for you to continue getting your b12 injections here you are going to need to have a office visit with Dr Jarold Motto. She said she would be calling Mrs. Melissas office about that Because she was told she could come to our office and get them, and i said she could but that would require her to have an office visit with Dr Jarold Motto since the b12 is being ordered and given under his name. She said I could stop talk because she did not want to hear what I had to say again and she would be calling Mrs. Melissas office about this I told her she could call them but it was our office policy, and she was free to go and have a nice day, but she was not going to be able to get another injection until she has an office visit with Dr Jarold Motto.

## 2011-08-19 ENCOUNTER — Telehealth: Payer: Self-pay | Admitting: Family

## 2011-08-19 NOTE — Telephone Encounter (Signed)
Pls call pt and let her know that she can continue her monthly b12 injections at our office as GI is no longer able to continue them.

## 2011-08-20 NOTE — Telephone Encounter (Signed)
Notified pt. She states she just received B12 injection around the end of December. Pt has follow up with Sandford Craze, NP on 09/12/11 and will get B12 injection then.

## 2011-09-03 ENCOUNTER — Ambulatory Visit (INDEPENDENT_AMBULATORY_CARE_PROVIDER_SITE_OTHER): Payer: 59 | Admitting: Family

## 2011-09-03 ENCOUNTER — Encounter: Payer: Self-pay | Admitting: Family

## 2011-09-03 DIAGNOSIS — R51 Headache: Secondary | ICD-10-CM

## 2011-09-03 DIAGNOSIS — Z5181 Encounter for therapeutic drug level monitoring: Secondary | ICD-10-CM

## 2011-09-03 DIAGNOSIS — F319 Bipolar disorder, unspecified: Secondary | ICD-10-CM

## 2011-09-03 DIAGNOSIS — M109 Gout, unspecified: Secondary | ICD-10-CM

## 2011-09-03 DIAGNOSIS — E538 Deficiency of other specified B group vitamins: Secondary | ICD-10-CM

## 2011-09-03 LAB — URIC ACID: Uric Acid, Serum: 4.7 mg/dL (ref 2.4–7.0)

## 2011-09-03 MED ORDER — QUETIAPINE FUMARATE 200 MG PO TABS: 300.0000 mg | ORAL_TABLET | Freq: Every day | ORAL | Status: DC

## 2011-09-03 MED ORDER — CYANOCOBALAMIN 1000 MCG/ML IJ SOLN
1000.0000 ug | Freq: Once | INTRAMUSCULAR | Status: AC
  Administered 2011-09-03: 1000 ug via INTRAMUSCULAR

## 2011-09-03 MED ORDER — COLCHICINE 0.6 MG PO TABS: 0.6000 mg | ORAL_TABLET | Freq: Every day | ORAL | Status: DC

## 2011-09-03 MED ORDER — LITHIUM CARBONATE 300 MG PO TABS: ORAL_TABLET | ORAL | Status: DC

## 2011-09-03 NOTE — Progress Notes (Signed)
Subjective:    Patient ID: Lynn Gonzalez, female    DOB: 10-31-1960, 51 y.o.   MRN: [id number]  HPI  [name] is a 51 yr old female who presents today with chief complaint of headache. She reports daily headaches. She reports that she only has headaches when she enters her apartment. She believes that there is mold in the apartment.  When she exits the apartment her headache resolves.  She is frustrated with the housing authorities because they will not find her a more suitable apartment.  L foot swelling, pain- near the base of the left big toe.  Felt like foot is on fire.  She reports that she took some pain pills and some colchicine that she had on hand and the symptoms resolved.   Review of Systems See HPI  Past Medical History  Diagnosis Date  . Bipolar affective disorder 1985    ?nervous breakdown  . Thyroid disease     ? overactive thyroid  . Breast tumor   . GERD (gastroesophageal reflux disease)     History   Social History  . Marital Status: Single    Spouse Name: N/A    Number of Children: N/A  . Years of Education: N/A   Occupational History  . [employer]     [employer]   Social History Main Topics  . Smoking status: Never Smoker   . Smokeless tobacco: Never Used  . Alcohol Use: No  . Drug Use: No  . Sexually Active: Not on file   Other Topics Concern  . Not on file   Social History Narrative   singleRegular exercise:  Yes    Past Surgical History  Procedure Date  . Tubal ligation 1985, 1996  . Gallbladder surgery 1985    stones removed  . Mediastinoscopy 2001  . Sternotomy 2001  . Bunionectomy     bilateral  . Abdominal hysterectomy 06/03/10  . Knee surgery 1987    bilateral    Family History  Problem Relation Age of Onset  . Diabetes Mother   . Hypertension Mother   . Cancer Father     ?type  . Hypertension Father     No Known Allergies  Current Outpatient Prescriptions on File Prior to Visit  Medication Sig Dispense Refill  .  albuterol (PROVENTIL HFA;VENTOLIN HFA) 108 (90 BASE) MCG/ACT inhaler Inhale 2 puffs into the lungs every 6 (six) hours as needed for wheezing.  1 Inhaler  1  . cyanocobalamin (,VITAMIN B-12,) 1000 MCG/ML injection Inject 1,000 mcg into the muscle every 30 (thirty) days.        Marland Kitchen estradiol (ESTRACE) 2 MG tablet Take 2 mg by mouth daily.       . folic acid (FOLVITE) 1 MG tablet Take 1 tablet (1 mg total) by mouth daily.  30 tablet  5  . pantoprazole (PROTONIX) 40 MG tablet Take 1 tablet (40 mg total) by mouth daily.  30 tablet  5    BP 124/80  Pulse 78  Temp(Src) 98.2 F (36.8 C) (Oral)  Resp 16  Wt 171 lb (77.565 kg)  LMP 04/18/2010       Objective:   Physical Exam  Constitutional: She appears well-developed and well-nourished. No distress.  HENT:  Head: Normocephalic.  Cardiovascular: Normal rate and regular rhythm.   No murmur heard. Pulmonary/Chest: Effort normal and breath sounds normal. No respiratory distress. She has no wheezes. She has no rales. She exhibits no tenderness.  Musculoskeletal:  No swelling or erythema noted at the base of the left toe.   Skin: Skin is warm and dry.  Psychiatric: She has a normal mood and affect. Her behavior is normal. Judgment and thought content normal.          Assessment & Plan:

## 2011-09-03 NOTE — Patient Instructions (Signed)
Please complete your blood work prior to leaving.  You will be contacted about your referral to psychiatry.  Follow up in 3 months, sooner if problems/concerns.

## 2011-09-04 LAB — LITHIUM LEVEL: Lithium Lvl: <0.01 mEq/L — ABNORMAL LOW (ref 0.80–1.40)

## 2011-09-05 ENCOUNTER — Telehealth: Payer: Self-pay | Admitting: Family

## 2011-09-05 NOTE — Assessment & Plan Note (Signed)
She will start monthly b12 injections at our office.  1st today.

## 2011-09-05 NOTE — Telephone Encounter (Signed)
Notified pt. 

## 2011-09-05 NOTE — Assessment & Plan Note (Signed)
She has been unable to contact her mental health provider as number is "out of service." CMA tried today as well and was unsuccessful.  In the meantime, I have refilled her meds as she is off of them and will try to get her established with a new psychiatrist.

## 2011-09-05 NOTE — Telephone Encounter (Signed)
Her uric acid level which is test for gout is normal.  I would recommend that she only use the colcrys as needed and the dose at that time will be 2 tabs at start of "attack" with one tablet an hour later.

## 2011-09-05 NOTE — Assessment & Plan Note (Signed)
I think that her headache are related to environmental exposure in her apartment. Likely mold allergy.  I have written a letter for her to bring to the housing authorities alerting them of my concerns. In the meantime, I recommend that she add zyrtec once daily.

## 2011-09-05 NOTE — Assessment & Plan Note (Addendum)
Sounds like she had a recent gout flare that resolved with use of colcrys. Will plan to continue colcrys on a PRN basis as uric acid level is normal.

## 2011-09-09 ENCOUNTER — Encounter: Payer: Self-pay | Admitting: Family

## 2011-09-09 ENCOUNTER — Telehealth: Payer: Self-pay | Admitting: *Deleted

## 2011-09-09 NOTE — Telephone Encounter (Signed)
Pls call pt and let her know that we are mailing her a follow up letter to address the concerns of her land lord for her to review and forward to her land lord.  Pls let Ms. Wise know that Ms. Carcione will provide her with a follow up letter addressing her concerns.

## 2011-09-09 NOTE — Telephone Encounter (Signed)
Notified pt of follow up letter and she states she will pick it up tomorrow.  Advised pt to also give a copy to the housing authority. She states she has also spoken with her case worker and is trying to arrange a meeting with her case Event organiser as well. Pt will distribute copies of letter accordingly. Left message on Griselda Miner voicemail [phone]) that pt will bring a follow up letter addressing the concerns below.

## 2011-09-09 NOTE — Telephone Encounter (Signed)
Received call from pt's Property Manager, Robyne Askew, with concerns re: letter to transition to a new apartment. She wants to know how we know that mold is present in pt's unit? Why is it medically necessary to be reassigned? States pt's unit has been inspected by [employer] [place] and [employer] and mold has not been found. Wants to know how we can determine mold is the cause of pt's symptoms when mold has not been found on inspections? Please call her at [phone].

## 2011-09-10 ENCOUNTER — Telehealth: Payer: Self-pay | Admitting: Family

## 2011-09-10 NOTE — Telephone Encounter (Signed)
Left detailed message on Lynn Gonzalez' voicemail that the patient has been provided with a detailed letter re: medical necessity for placement in another apartment and she can obtain a copy from the patient.

## 2011-09-10 NOTE — Telephone Encounter (Signed)
Deborah Chalk from [place] housing authority states that she received letter from patient stating that it is medically necessary to move out of her apartment into another apartment. Adela Lank states that they need a letter from [place] stating as to what is going on with patient and the reason why she needs to move.

## 2011-09-12 ENCOUNTER — Ambulatory Visit: Payer: Medicare Other | Admitting: Family

## 2011-09-15 ENCOUNTER — Encounter (HOSPITAL_COMMUNITY): Payer: Self-pay | Admitting: Emergency Medicine

## 2011-09-15 ENCOUNTER — Telehealth: Payer: Self-pay | Admitting: *Deleted

## 2011-09-15 ENCOUNTER — Emergency Department (HOSPITAL_COMMUNITY): Payer: Medicare Other

## 2011-09-15 ENCOUNTER — Emergency Department (HOSPITAL_COMMUNITY)
Admission: EM | Admit: 2011-09-15 | Discharge: 2011-09-15 | Disposition: A | Discharge status: Home / Self Care | Payer: Medicare Other | Attending: Emergency Medicine | Admitting: Emergency Medicine

## 2011-09-15 DIAGNOSIS — W010XXA Fall on same level from slipping, tripping and stumbling without subsequent striking against object, initial encounter: Secondary | ICD-10-CM | POA: Insufficient documentation

## 2011-09-15 DIAGNOSIS — W19XXXA Unspecified fall, initial encounter: Secondary | ICD-10-CM

## 2011-09-15 DIAGNOSIS — R079 Chest pain, unspecified: Secondary | ICD-10-CM | POA: Insufficient documentation

## 2011-09-15 DIAGNOSIS — S7000XA Contusion of unspecified hip, initial encounter: Secondary | ICD-10-CM | POA: Insufficient documentation

## 2011-09-15 DIAGNOSIS — R51 Headache: Secondary | ICD-10-CM | POA: Insufficient documentation

## 2011-09-15 DIAGNOSIS — M25559 Pain in unspecified hip: Secondary | ICD-10-CM | POA: Insufficient documentation

## 2011-09-15 DIAGNOSIS — G319 Degenerative disease of nervous system, unspecified: Secondary | ICD-10-CM | POA: Insufficient documentation

## 2011-09-15 MED ORDER — IBUPROFEN 800 MG PO TABS: 800.0000 mg | ORAL_TABLET | Freq: Three times a day (TID) | ORAL | Status: AC

## 2011-09-15 MED ORDER — IBUPROFEN 800 MG PO TABS
800.0000 mg | ORAL_TABLET | Freq: Once | ORAL | Status: AC
  Administered 2011-09-15: 800 mg via ORAL
  Filled 2011-09-15: qty 1

## 2011-09-15 NOTE — ED Notes (Signed)
Walked 49ft steady tolerated

## 2011-09-15 NOTE — ED Provider Notes (Signed)
History     CSN: [id number]  Arrival date & time 09/15/11  1638   First MD Initiated Contact with Patient 09/15/11 1658      Chief Complaint  Patient presents with  . Fall    Tripped  and fell denies loc denies dizziness    (Consider location/radiation/quality/duration/timing/severity/associated sxs/prior treatment) HPI Comments: Patient fell while transferring from one bus to another bus. Intraoperative recurred. She fell and landing on her left side hitting her head. She denies loss of consciousness. She complained of pain in the left side of her face, left hip and low back. She denies any weakness, numbness, tingling, chest pain, abdominal pain, nausea or vomiting. She did not walk after the accident. She had a recent foot surgery  The history is provided by the patient.    Past Medical History  Diagnosis Date  . Bipolar affective disorder 1985    ?nervous breakdown  . Thyroid disease     ? overactive thyroid  . Breast tumor   . GERD (gastroesophageal reflux disease)     Past Surgical History  Procedure Date  . Tubal ligation 1985, 1996  . Gallbladder surgery 1985    stones removed  . Mediastinoscopy 2001  . Sternotomy 2001  . Bunionectomy     bilateral  . Abdominal hysterectomy 06/03/10  . Knee surgery 1987    bilateral    Family History  Problem Relation Age of Onset  . Diabetes Mother   . Hypertension Mother   . Cancer Father     ?type  . Hypertension Father     History  Substance Use Topics  . Smoking status: Never Smoker   . Smokeless tobacco: Never Used  . Alcohol Use: No    OB History    Grav Para Term Preterm Abortions TAB SAB Ect Mult Living                  Review of Systems  Constitutional: Negative for fever, activity change and appetite change.  HENT: Negative for congestion and rhinorrhea.   Respiratory: Negative for cough, chest tightness and shortness of breath.   Cardiovascular: Negative for chest pain.  Gastrointestinal:  Negative for nausea, vomiting and abdominal pain.  Genitourinary: Negative for dysuria.  Musculoskeletal: Positive for myalgias, back pain and arthralgias.  Skin: Negative for rash.  Neurological: Positive for headaches. Negative for dizziness, syncope and weakness.  Hematological: Negative.     Allergies  Review of patient's allergies indicates no known allergies.  Home Medications   Current Outpatient Rx  Name Route Sig Dispense Refill  . ALBUTEROL SULFATE HFA 108 (90 BASE) MCG/ACT IN AERS Inhalation Inhale 2 puffs into the lungs every 6 (six) hours as needed for wheezing. 1 Inhaler 1  . BUTALBITAL-APAP-CAFFEINE 50-325-40 MG PO TABS Oral Take 1 tablet by mouth every 6 (six) hours as needed. For headaches.    . CYANOCOBALAMIN 1000 MCG/ML IJ SOLN Intramuscular Inject 1,000 mcg into the muscle every 30 (thirty) days.      Marland Kitchen ESTRADIOL 2 MG PO TABS Oral Take 2 mg by mouth daily.     Marland Kitchen FOLIC ACID 1 MG PO TABS Oral Take 1 tablet (1 mg total) by mouth daily. 30 tablet 5  . IBUPROFEN 600 MG PO TABS Oral Take 600 mg by mouth every 8 (eight) hours as needed. For pain.    Marland Kitchen LITHIUM CARBONATE 300 MG PO TABS  Take 1 tablet every morning and 2 tablets at bedtime. 90 tablet 1  .  PANTOPRAZOLE SODIUM 40 MG PO TBEC Oral Take 1 tablet (40 mg total) by mouth daily. 30 tablet 5  . QUETIAPINE FUMARATE 200 MG PO TABS Oral Take 1.5 tablets (300 mg total) by mouth at bedtime. 45 tablet 2  . IBUPROFEN 800 MG PO TABS Oral Take 1 tablet (800 mg total) by mouth 3 (three) times daily. 21 tablet 0    BP 128/77  Pulse 89  Temp(Src) 98.4 F (36.9 C) (Oral)  Resp 20  SpO2 100%  LMP 04/18/2010  Physical Exam  Constitutional: She is oriented to person, place, and time. She appears well-developed and well-nourished. No distress.  HENT:  Head: Normocephalic and atraumatic.  Mouth/Throat: Oropharynx is clear and moist. No oropharyngeal exudate.  Eyes: Conjunctivae and EOM are normal. Pupils are equal, round, and  reactive to light.  Neck: Normal range of motion. Neck supple.       No C-spine pain, step-off or deformity, cleared clinically by  me  Cardiovascular: Normal rate, regular rhythm and normal heart sounds.   Pulmonary/Chest: Effort normal and breath sounds normal. No respiratory distress.  Abdominal: Soft. There is no tenderness. There is no rebound and no guarding.  Musculoskeletal: Normal range of motion. She exhibits tenderness. She exhibits no edema.       Tender to palpation of the left lateral hip was reduced range of motion secondary to pain No T. or L-spine pain step-off or deformity  Neurological: She is alert and oriented to person, place, and time. No cranial nerve deficit.  Skin: Skin is warm.    ED Course  Procedures (including critical care time)  Labs Reviewed - No data to display Dg Chest 2 View  09/15/2011  *RADIOLOGY REPORT*  Clinical Data: Fall.  Mid chest pain.  CHEST - 2 VIEW 09/15/2011:  Comparison: Portable chest x-ray 06/27/2011 [facility], two-view chest x-ray 04/29/2011 [facility], two-view chest x-ray 02/07/2010 [facility].  Findings: Suboptimal inspiration accounts for crowded bronchovascular markings, especially in the lung bases, and accentuates the cardiac silhouette.  Prior sternotomy.  Cardiac silhouette upper normal in size to slightly enlarged but stable. Pulmonary vascularity normal without evidence of pulmonary edema. Hilar and mediastinal contours otherwise unremarkable.  Lungs clear.  No pleural effusions.  Visualized bony thorax intact.  IMPRESSION: Suboptimal inspiration.  Stable borderline heart size.  No acute cardiopulmonary disease.  Original Report Authenticated By: Arnell Sieving, M.D.   Dg Hip Complete Left  09/15/2011  *RADIOLOGY REPORT*  Clinical Data: Fall.  Left hip pain.  LEFT HIP - COMPLETE 2+ VIEW  Comparison: None.  Findings: Pelvic rings appear intact.  No fracture is identified. Mild pubic symphysial  degenerative disease.  Acetabulum appears intact.  IMPRESSION: No acute osseous abnormality.  Original Report Authenticated By: Andreas Newport, M.D.   Ct Head Wo Contrast  09/15/2011  *RADIOLOGY REPORT*  Clinical Data: Headache.  Fall.  CT HEAD WITHOUT CONTRAST  Technique:  Contiguous axial images were obtained from the base of the skull through the vertex without contrast.  Comparison: 04/19/2011.  MRI brain.  Findings: Slightly age advanced atrophy and chronic ischemic white matter disease is present, similar to prior MRI. No mass lesion, mass effect, midline shift, hydrocephalus, hemorrhage.  No territorial ischemia or acute infarction.  Globes appear within normal limits.  Mastoid air cells and paranasal sinuses appear normal.  IMPRESSION: Age advanced atrophy and chronic ischemic white matter disease is nonspecific.  No acute intracranial abnormality.  Original Report Authenticated By: Andreas Newport, M.D.  1. Fall   2. Contusion, hip       MDM  Mechanical fall with head and left hip pain. Neurovascular intact. Alert and oriented.  Imaging is negative for fracture. Patient ambulatory in the ED with slight limp on the left side she is able to bear weight with her left leg.      Glynn Octave, MD 09/15/11 [phone]

## 2011-09-15 NOTE — ED Notes (Signed)
Bed:WHALA<BR> Expected date:09/15/11<BR> Expected time: 4:40 PM<BR> Means of arrival:Ambulance<BR> Comments:<BR> [id number]

## 2011-09-15 NOTE — Telephone Encounter (Signed)
Received voice message from pt requesting a return call. Left message for pt to return my call.

## 2011-09-16 MED ORDER — ESTRADIOL 2 MG PO TABS: 2.0000 mg | ORAL_TABLET | Freq: Every day | ORAL | Status: DC

## 2011-09-16 NOTE — Telephone Encounter (Signed)
I will rx short term for her until she can get back in to be seen by her GYN.

## 2011-09-16 NOTE — Telephone Encounter (Signed)
Pt states she has been unable to get her estradiol from her GYN due to an outstanding balance. Wants to know if you can prescribe these for her? Pt has scheduled an ER follow up for tomorrow at 9:30 with [place]. Please advise.

## 2011-09-17 ENCOUNTER — Encounter: Payer: Self-pay | Admitting: Family

## 2011-09-17 ENCOUNTER — Ambulatory Visit (INDEPENDENT_AMBULATORY_CARE_PROVIDER_SITE_OTHER): Payer: 59 | Admitting: Family

## 2011-09-17 VITALS — BP 124/90 | HR 78 | Temp 98.2°F | Resp 16 | Wt 165.1 lb

## 2011-09-17 DIAGNOSIS — W19XXXA Unspecified fall, initial encounter: Secondary | ICD-10-CM | POA: Insufficient documentation

## 2011-09-17 DIAGNOSIS — S0003XA Contusion of scalp, initial encounter: Secondary | ICD-10-CM

## 2011-09-17 NOTE — Telephone Encounter (Signed)
Notified pt. 

## 2011-09-17 NOTE — Assessment & Plan Note (Signed)
S/p accidental fall. Imaging is reviewed.  She is sore, but clinically stable.  I recommended that she use motrin PRN short term.

## 2011-09-17 NOTE — Patient Instructions (Signed)
Please follow up after 2/16 for your monthly b12 injection.  Follow up for a regular appointment in 3 months.

## 2011-09-17 NOTE — Progress Notes (Signed)
Subjective:    Patient ID: Lynn Gonzalez, female    DOB: Oct 08, 1960, 51 y.o.   MRN: [id number]  HPI  [name] is a 51 yr old female who presents today for ED follow up.  She reports that she tri[place] over the broken edge [place] She [place] and hit her head/left side.  An ambulance was called and she was brought to the ED.  She was brought to [facility].  She had a CT of the head performed which showed no acute findings. She also had a hip x-ray and CXR which showed no acute findings.  She reports that she has some headaches in the back of her head.  She reports some associated stiffness.    Review of Systems See HPI  Past Medical History  Diagnosis Date  . Bipolar affective disorder 1985    ?nervous breakdown  . Thyroid disease     ? overactive thyroid  . Breast tumor   . GERD (gastroesophageal reflux disease)     History   Social History  . Marital Status: Single    Spouse Name: N/A    Number of Children: N/A  . Years of Education: N/A   Occupational History  . [employer]   Social History Main Topics  . Smoking status: Never Smoker   . Smokeless tobacco: Never Used  . Alcohol Use: No  . Drug Use: No  . Sexually Active: Not on file   Other Topics Concern  . Not on file   Social History Narrative   singleRegular exercise:  Yes    Past Surgical History  Procedure Date  . Tubal ligation 1985, 1996  . Gallbladder surgery 1985    stones removed  . Mediastinoscopy 2001  . Sternotomy 2001  . Bunionectomy     bilateral  . Abdominal hysterectomy 06/03/10  . Knee surgery 1987    bilateral    Family History  Problem Relation Age of Onset  . Diabetes Mother   . Hypertension Mother   . Cancer Father     ?type  . Hypertension Father     No Known Allergies  Current Outpatient Prescriptions on File Prior to Visit  Medication Sig Dispense Refill  . albuterol (PROVENTIL HFA;VENTOLIN HFA) 108 (90 BASE) MCG/ACT inhaler  Inhale 2 puffs into the lungs every 6 (six) hours as needed for wheezing.  1 Inhaler  1  . butalbital-acetaminophen-caffeine (FIORICET, ESGIC) 50-325-40 MG per tablet Take 1 tablet by mouth every 6 (six) hours as needed. For headaches.      . cyanocobalamin (,VITAMIN B-12,) 1000 MCG/ML injection Inject 1,000 mcg into the muscle every 30 (thirty) days.        Marland Kitchen estradiol (ESTRACE) 2 MG tablet Take 1 tablet (2 mg total) by mouth daily.  30 tablet  2  . folic acid (FOLVITE) 1 MG tablet Take 1 tablet (1 mg total) by mouth daily.  30 tablet  5  . ibuprofen (ADVIL,MOTRIN) 600 MG tablet Take 600 mg by mouth every 8 (eight) hours as needed. For pain.      Marland Kitchen ibuprofen (ADVIL,MOTRIN) 800 MG tablet Take 1 tablet (800 mg total) by mouth 3 (three) times daily.  21 tablet  0  . lithium 300 MG tablet Take 1 tablet every morning and 2 tablets at bedtime.  90 tablet  1  . pantoprazole (PROTONIX) 40 MG tablet Take 1 tablet (40 mg total)  by mouth daily.  30 tablet  5  . QUEtiapine (SEROQUEL) 200 MG tablet Take 1.5 tablets (300 mg total) by mouth at bedtime.  45 tablet  2    BP 124/90  Pulse 78  Temp(Src) 98.2 F (36.8 C) (Oral)  Resp 16  Wt 165 lb 1.9 oz (74.898 kg)  SpO2 99%  LMP 04/18/2010       Objective:   Physical Exam  Constitutional: She is oriented to person, place, and time. She appears well-developed and well-nourished. No distress.  Eyes: Pupils are equal, round, and reactive to light.  Cardiovascular: Normal rate and regular rhythm.   Pulmonary/Chest: Effort normal and breath sounds normal. No respiratory distress. She has no wheezes. She has no rales. She exhibits no tenderness.  Neurological: She is alert and oriented to person, place, and time. No cranial nerve deficit. Coordination normal.  Psychiatric: She has a normal mood and affect. Her behavior is normal. Judgment and thought content normal.          Assessment & Plan:

## 2011-10-16 ENCOUNTER — Other Ambulatory Visit: Payer: Self-pay | Admitting: Family

## 2011-10-27 ENCOUNTER — Ambulatory Visit (INDEPENDENT_AMBULATORY_CARE_PROVIDER_SITE_OTHER): Payer: 59 | Admitting: Family

## 2011-10-27 ENCOUNTER — Encounter: Payer: Self-pay | Admitting: Family

## 2011-10-27 VITALS — BP 130/90 | HR 79 | Temp 98.3°F | Resp 16 | Wt 171.0 lb

## 2011-10-27 DIAGNOSIS — R51 Headache: Secondary | ICD-10-CM

## 2011-10-27 DIAGNOSIS — D649 Anemia, unspecified: Secondary | ICD-10-CM

## 2011-10-27 DIAGNOSIS — R5381 Other malaise: Secondary | ICD-10-CM

## 2011-10-27 DIAGNOSIS — M79609 Pain in unspecified limb: Secondary | ICD-10-CM

## 2011-10-27 DIAGNOSIS — E538 Deficiency of other specified B group vitamins: Secondary | ICD-10-CM

## 2011-10-27 DIAGNOSIS — R5383 Other fatigue: Secondary | ICD-10-CM

## 2011-10-27 DIAGNOSIS — R531 Weakness: Secondary | ICD-10-CM

## 2011-10-27 DIAGNOSIS — M79641 Pain in right hand: Secondary | ICD-10-CM

## 2011-10-27 DIAGNOSIS — E0590 Thyrotoxicosis, unspecified without thyrotoxic crisis or storm: Secondary | ICD-10-CM

## 2011-10-27 LAB — CBC WITH DIFFERENTIAL/PLATELET
Basophils Absolute: 0 10*3/uL (ref 0.0–0.1)
Basophils Relative: 0 % (ref 0–1)
Eosinophils Absolute: 0.1 10*3/uL (ref 0.0–0.7)
Hemoglobin: 12.5 g/dL (ref 12.0–15.0)
MCH: 26.3 pg (ref 26.0–34.0)
MCHC: 30.6 g/dL (ref 30.0–36.0)
Monocytes Relative: 6 % (ref 3–12)
Neutrophils Relative %: 68 % (ref 43–77)
Platelets: 205 10*3/uL (ref 150–400)
RDW: 14.3 % (ref 11.5–15.5)

## 2011-10-27 MED ORDER — CYANOCOBALAMIN 1000 MCG/ML IJ SOLN
1000.0000 ug | Freq: Once | INTRAMUSCULAR | Status: AC
  Administered 2011-10-27: 1000 ug via INTRAMUSCULAR

## 2011-10-27 MED ORDER — SUMATRIPTAN SUCCINATE 50 MG PO TABS: ORAL_TABLET | ORAL | Status: DC

## 2011-10-27 NOTE — Progress Notes (Signed)
Subjective:    Patient ID: Lynn Gonzalez, female    DOB: [birth year], 51 y.o.   MRN: [id number]  HPI  Ms.  Dillion is a 51 yr old female who presents today for follow up.   Anemia- Pt reports that she stopped iron pills, but plans to restart.  She is due for b12 shot today.  Headache, reports that HA started this weekend, now getting worse. Has associated light headedness. She has been taking advil.  Reports not photophobia.  HA is dull constant, frontal.    She has rx for imitrex, but has not tried it.  R arm pain- right arm and hand pain and numbness. Review of Systems    see HPI  Past Medical History  Diagnosis Date  . Bipolar affective disorder 1985    ?nervous breakdown  . Thyroid disease     ? overactive thyroid  . Breast tumor   . GERD (gastroesophageal reflux disease)     History   Social History  . Marital Status: Single    Spouse Name: N/A    Number of Children: N/A  . Years of Education: N/A   Occupational History  . [employer]     [employer]   Social History Main Topics  . Smoking status: Never Smoker   . Smokeless tobacco: Never Used  . Alcohol Use: No  . Drug Use: No  . Sexually Active: Not on file   Other Topics Concern  . Not on file   Social History Narrative   singleRegular exercise:  Yes    Past Surgical History  Procedure Date  . Tubal ligation 1985, 1996  . Gallbladder surgery 1985    stones removed  . Mediastinoscopy 2001  . Sternotomy 2001  . Bunionectomy     bilateral  . Abdominal hysterectomy 06/03/10  . Knee surgery 1987    bilateral    Family History  Problem Relation Age of Onset  . Diabetes Mother   . Hypertension Mother   . Cancer Father     ?type  . Hypertension Father     No Known Allergies  Current Outpatient Prescriptions on File Prior to Visit  Medication Sig Dispense Refill  . cyanocobalamin (,VITAMIN B-12,) 1000 MCG/ML injection Inject 1,000 mcg into the muscle every 30 (thirty) days.        Marland Kitchen estradiol  (ESTRACE) 2 MG tablet Take 1 tablet (2 mg total) by mouth daily.  30 tablet  2  . folic acid (FOLVITE) 1 MG tablet Take 1 tablet (1 mg total) by mouth daily.  30 tablet  5  . ibuprofen (ADVIL,MOTRIN) 600 MG tablet Take 600 mg by mouth every 8 (eight) hours as needed. For pain.      Marland Kitchen lithium 300 MG tablet Take 1 tablet every morning and 2 tablets at bedtime.  90 tablet  1  . pantoprazole (PROTONIX) 40 MG tablet Take 1 tablet (40 mg total) by mouth daily.  30 tablet  5  . PROAIR HFA 108 (90 BASE) MCG/ACT inhaler INHALE 2 PUFFS INTO THE LUNGS EVERY 6 HOURS AS NEEDED FOR WHEEZING.  8.5 g  2  . QUEtiapine (SEROQUEL) 200 MG tablet Take 1.5 tablets (300 mg total) by mouth at bedtime.  45 tablet  2   No current facility-administered medications on file prior to visit.    BP 130/90  Pulse 79  Temp(Src) 98.3 F (36.8 C) (Oral)  Resp 16  Wt 171 lb 0.6 oz (77.583 kg)  SpO2 98%  LMP 04/18/2010    Objective:   Physical Exam  Constitutional: She appears well-developed and well-nourished. No distress.  HENT:  Head: Normocephalic and atraumatic.  Cardiovascular: Normal rate and regular rhythm.   No murmur heard. Pulmonary/Chest: Effort normal and breath sounds normal. No respiratory distress. She has no wheezes. She has no rales. She exhibits no tenderness.  Musculoskeletal: She exhibits no edema.  Skin: Skin is warm and dry.  Psychiatric: She has a normal mood and affect. Her behavior is normal. Judgment and thought content normal.          Assessment & Plan:

## 2011-10-27 NOTE — Patient Instructions (Signed)
Please call if your HA worsens or if it does not improve with the sumatriptan. Complete blood work prior to leaving.  Follow up in 3 months.

## 2011-10-28 ENCOUNTER — Encounter: Payer: Self-pay | Admitting: Family

## 2011-10-28 ENCOUNTER — Telehealth: Payer: Self-pay | Admitting: Family

## 2011-10-28 DIAGNOSIS — M79641 Pain in right hand: Secondary | ICD-10-CM | POA: Insufficient documentation

## 2011-10-28 LAB — TSH: TSH: 0.88 u[IU]/mL (ref 0.350–4.500)

## 2011-10-28 NOTE — Assessment & Plan Note (Signed)
Recommend trial of prn imitrex.

## 2011-10-28 NOTE — Assessment & Plan Note (Signed)
Pt to resume iron supplement, continue b12 shots monthly. CBC looks good.

## 2011-10-28 NOTE — Assessment & Plan Note (Addendum)
?   Carpal tunnel, trial of aleve bid for the next few days.

## 2011-10-28 NOTE — Telephone Encounter (Signed)
Pls call pt and let her know that I would like for her to add aleve 220mg  twice daily for next several days to see if this helps with her right hand and arm pain.  Let us know if pain worsens or does not improve.

## 2011-10-28 NOTE — Telephone Encounter (Signed)
Left detailed message on cell and to call if any questions. 

## 2011-10-28 NOTE — Assessment & Plan Note (Signed)
Follow up TSH is normal.  US thyroid 2011: Normal to decreased thyroid size with mild heterogeneous echotexture. No nodules identified. Monitor.

## 2011-11-24 ENCOUNTER — Ambulatory Visit (INDEPENDENT_AMBULATORY_CARE_PROVIDER_SITE_OTHER): Payer: 59 | Admitting: Family

## 2011-11-24 DIAGNOSIS — E538 Deficiency of other specified B group vitamins: Secondary | ICD-10-CM

## 2011-11-24 MED ORDER — CYANOCOBALAMIN 1000 MCG/ML IJ SOLN
1000.0000 ug | Freq: Once | INTRAMUSCULAR | Status: AC
  Administered 2011-11-24: 1000 ug via INTRAMUSCULAR

## 2011-11-26 ENCOUNTER — Ambulatory Visit: Payer: 59

## 2011-11-26 ENCOUNTER — Other Ambulatory Visit: Payer: Self-pay | Admitting: Family

## 2011-11-27 NOTE — Telephone Encounter (Signed)
Rx refill sent to pharmacy. 

## 2011-12-10 ENCOUNTER — Emergency Department (HOSPITAL_COMMUNITY): Payer: Medicare Other

## 2011-12-10 ENCOUNTER — Encounter (HOSPITAL_COMMUNITY): Payer: Self-pay | Admitting: Emergency Medicine

## 2011-12-10 ENCOUNTER — Emergency Department (HOSPITAL_COMMUNITY)
Admission: EM | Admit: 2011-12-10 | Discharge: 2011-12-10 | Disposition: A | Discharge status: Home / Self Care | Payer: Medicare Other | Attending: Emergency Medicine | Admitting: Emergency Medicine

## 2011-12-10 DIAGNOSIS — E079 Disorder of thyroid, unspecified: Secondary | ICD-10-CM | POA: Insufficient documentation

## 2011-12-10 DIAGNOSIS — M25519 Pain in unspecified shoulder: Secondary | ICD-10-CM

## 2011-12-10 DIAGNOSIS — Z79899 Other long term (current) drug therapy: Secondary | ICD-10-CM | POA: Insufficient documentation

## 2011-12-10 DIAGNOSIS — M25559 Pain in unspecified hip: Secondary | ICD-10-CM

## 2011-12-10 DIAGNOSIS — K219 Gastro-esophageal reflux disease without esophagitis: Secondary | ICD-10-CM | POA: Insufficient documentation

## 2011-12-10 DIAGNOSIS — M25419 Effusion, unspecified shoulder: Secondary | ICD-10-CM | POA: Insufficient documentation

## 2011-12-10 MED ORDER — DICLOFENAC SODIUM 75 MG PO TBEC: 75.0000 mg | DELAYED_RELEASE_TABLET | Freq: Two times a day (BID) | ORAL | Status: DC

## 2011-12-10 MED ORDER — TRAMADOL HCL 50 MG PO TABS: 50.0000 mg | ORAL_TABLET | Freq: Four times a day (QID) | ORAL | Status: AC | PRN

## 2011-12-10 NOTE — Discharge Instructions (Signed)
Hip Pain The hips join the upper legs to the lower pelvis. The bones, cartilage, tendons, and muscles of the hip joint perform a lot of work each day holding your body weight and allowing you to move around. Hip pain is a common symptom. It can range from a minor ache to severe pain on 1 or both hips. Pain may be felt on the inside of the hip joint near the groin, or the outside near the buttocks and upper thigh. There may be swelling or stiffness as well. It occurs more often when a person walks or performs activity. There are many reasons hip pain can develop. CAUSES  It is important to work with your caregiver to identify the cause since many conditions can impact the bones, cartilage, muscles, and tendons of the hips. Causes for hip pain include:  Broken (fractured) bones.   Separation of the thighbone from the hip socket (dislocation).   Torn cartilage of the hip joint.   Swelling (inflammation) of a tendon (tendonitis), the sac within the hip joint (bursitis), or a joint.   A weakening in the abdominal wall (hernia), affecting the nerves to the hip.   Arthritis in the hip joint or lining of the hip joint.   Pinched nerves in the back, hip, or upper thigh.   A bulging disc in the spine (herniated disc).   Rarely, bone infection or cancer.  DIAGNOSIS  The location of your hip pain will help your caregiver understand what may be causing the pain. A diagnosis is based on your medical history, your symptoms, results from your physical exam, and results from diagnostic tests. Diagnostic tests may include X-ray exams, a computerized magnetic scan (magnetic resonance imaging, MRI), or bone scan. TREATMENT  Treatment will depend on the cause of your hip pain. Treatment may include:  Limiting activities and resting until symptoms improve.   Crutches or other walking supports (a cane or brace).   Ice, elevation, and compression.   Physical therapy or home exercises.   Shoe inserts or  special shoes.   Losing weight.   Medications to reduce pain.   Undergoing surgery.  HOME CARE INSTRUCTIONS   Only take over-the-counter or prescription medicines for pain, discomfort, or fever as directed by your caregiver.   Put ice on the injured area:   Put ice in a plastic bag.   Place a towel between your skin and the bag.   Leave the ice on for 15 to 20 minutes at a time, 3 to 4 times a day.   Keep your leg raised (elevated) when possible to lessen swelling.   Avoid activities that cause pain.   Follow specific exercises as directed by your caregiver.   Sleep with a pillow between your legs on your most comfortable side.   Record how often you have hip pain, the location of the pain, and what it feels like. This information may be helpful to you and your caregiver.   Ask your caregiver about returning to work or sports and whether you should drive.   Follow up with your caregiver for further exams, therapy, or testing as directed.  SEEK MEDICAL CARE IF:   Your pain or swelling continues or worsens after 1 week.   You are feeling unwell or have chills.   You have increasing difficulty with walking.   You have a loss of sensation or other new symptoms.   You have questions or concerns.  SEEK IMMEDIATE MEDICAL CARE IF:   You   cannot put weight on the affected hip.   You have fallen.   You have a sudden increase in pain and swelling in your hip.   You have a fever.  MAKE SURE YOU:   Understand these instructions.   Will watch your condition.   Will get help right away if you are not doing well or get worse.  Document Released: 01/22/2010 Document Revised: 07/24/2011 Document Reviewed: 01/22/2010 [facility] Patient Information 2012 [place], [place].Shoulder Pain The shoulder is a ball and socket joint. The muscles and tendons (rotator cuff) are what keep the shoulder in its joint and stable. This collection of muscles and tendons holds in the head (ball) of  the humerus (upper arm bone) in the fossa (cup) of the scapula (shoulder blade). Today no reason was found for your shoulder pain. Often pain in the shoulder may be treated conservatively with temporary immobilization. For example, holding the shoulder in one place using a sling for rest. Physical therapy may be needed if problems continue. HOME CARE INSTRUCTIONS   Apply ice to the sore area for 15 to 20 minutes, 3 to 4 times per day for the first 2 days. Put the ice in a plastic bag. Place a towel between the bag of ice and your skin.   If you have or were given a shoulder sling and straps, do not remove for as long as directed by your caregiver or until you see a caregiver for a follow-up examination. If you need to remove it to shower or bathe, move your arm as little as possible.   Sleep on several pillows at night to lessen swelling and pain.   Only take over-the-counter or prescription medicines for pain, discomfort, or fever as directed by your caregiver.   Keep any follow-up appointments in order to avoid any type of permanent shoulder disability or chronic pain problems.  SEEK MEDICAL CARE IF:   Pain in your shoulder increases or new pain develops in your arm, hand, or fingers.   Your hand or fingers are colder than your other hand.   You do not obtain pain relief with the medications or your pain becomes worse.  SEEK IMMEDIATE MEDICAL CARE IF:   Your arm, hand, or fingers are numb or tingling.   Your arm, hand, or fingers are swollen, painful, or turn white or blue.   You develop chest pain or shortness of breath.  MAKE SURE YOU:   Understand these instructions.   Will watch your condition.   Will get help right away if you are not doing well or get worse.  Document Released: 05/14/2005 Document Revised: 07/24/2011 Document Reviewed: 07/19/2011 [facility] Patient Information 2012 [place], [place].

## 2011-12-10 NOTE — ED Provider Notes (Signed)
Medical screening examination/treatment/procedure(s) were performed by non-physician practitioner and as supervising physician I was immediately available for consultation/collaboration.   Rolan Bucco, MD 12/10/11 [phone]

## 2011-12-10 NOTE — ED Notes (Signed)
Pt presenting to ed with c/o shoulder, neck, back and left hip pain s/p fall 2/28. Pt states she was seen here previously for the same but she's still having pain. Pt is able to move and ambulate without difficulty

## 2011-12-10 NOTE — ED Provider Notes (Signed)
History     CSN: [id number]  Arrival date & time 12/10/11  1649   First MD Initiated Contact with Patient 12/10/11 1710     5:43 PM HPI Patient reports having a fall on a city bus in February and also had a door that fell on her. Both injuries occurred on the left side of her body. Reports since then has had persistent pain without any relief. Patient states it is very painful to move her left shoulder or let it hang straight down. States she always has to hold her elbow bent to have some comfort. Denies numbness, tingling, weakness in her left upper extremity. Patient demands to have a shoulder sling for support. Patient is also reporting some hip pain. States she had an x-ray done and it was negative. States however she does continue to have pain and ibuprofen is not working. Patient able to walk without difficulty.  Patient is a 51 y.o. female presenting with shoulder pain. The history is provided by the patient.  Shoulder Pain This is a chronic problem. The current episode started more than 1 month ago. Associated symptoms include joint swelling. Pertinent negatives include no chest pain, chills, coughing, headaches, nausea, neck pain, numbness, vomiting or weakness. Exacerbated by: Use of left shoulder. She has tried NSAIDs for the symptoms. The treatment provided no relief.    Past Medical History  Diagnosis Date  . Bipolar affective disorder 1985    ?nervous breakdown  . Thyroid disease     ? overactive thyroid  . Breast tumor   . GERD (gastroesophageal reflux disease)     Past Surgical History  Procedure Date  . Tubal ligation 1985, 1996  . Gallbladder surgery 1985    stones removed  . Mediastinoscopy 2001  . Sternotomy 2001  . Bunionectomy     bilateral  . Abdominal hysterectomy 06/03/10  . Knee surgery 1987    bilateral    Family History  Problem Relation Age of Onset  . Diabetes Mother   . Hypertension Mother   . Cancer Father     ?type  . Hypertension Father       History  Substance Use Topics  . Smoking status: Never Smoker   . Smokeless tobacco: Never Used  . Alcohol Use: No    OB History    Grav Para Term Preterm Abortions TAB SAB Ect Mult Living                  Review of Systems  Constitutional: Negative for chills and unexpected weight change.  HENT: Negative for neck pain.   Respiratory: Negative for cough and shortness of breath.   Cardiovascular: Negative for chest pain.  Gastrointestinal: Negative for nausea and vomiting.  Musculoskeletal: Positive for joint swelling. Negative for back pain.       Shoulder pain and hip pain  Neurological: Negative for weakness, numbness and headaches.  All other systems reviewed and are negative.    Allergies  Review of patient's allergies indicates no known allergies.  Home Medications   Current Outpatient Rx  Name Route Sig Dispense Refill  . CYANOCOBALAMIN 1000 MCG/ML IJ SOLN Intramuscular Inject 1,000 mcg into the muscle every 30 (thirty) days.      Marland Kitchen ESTRADIOL 2 MG PO TABS Oral Take 1 tablet (2 mg total) by mouth daily. 30 tablet 2  . FOLIC ACID 1 MG PO TABS Oral Take 1 tablet (1 mg total) by mouth daily. 30 tablet 5  . IBUPROFEN 600 MG  PO TABS Oral Take 600 mg by mouth every 8 (eight) hours as needed. For pain.    Marland Kitchen LITHIUM CARBONATE 300 MG PO TABS  TAKE 1 TABLET BY MOUTH EVERY MORNING TAKE 2 TABLETS BY MOUTH EVERY DAY AT BEDTIME 90 tablet 0  . PANTOPRAZOLE SODIUM 40 MG PO TBEC Oral Take 1 tablet (40 mg total) by mouth daily. 30 tablet 5  . PROAIR HFA 108 (90 BASE) MCG/ACT IN AERS  INHALE 2 PUFFS INTO THE LUNGS EVERY 6 HOURS AS NEEDED FOR WHEEZING. 8.5 g 2  . QUETIAPINE FUMARATE 200 MG PO TABS Oral Take 1.5 tablets (300 mg total) by mouth at bedtime. 45 tablet 2    BP 125/87  Pulse 82  Temp(Src) 98.3 F (36.8 C) (Oral)  Resp 20  SpO2 99%  LMP 04/18/2010  Physical Exam  Vitals reviewed. Constitutional: She is oriented to person, place, and time. Vital signs are normal.  She appears well-developed and well-nourished. No distress.  HENT:  Head: Normocephalic and atraumatic.  Eyes: Pupils are equal, round, and reactive to light.  Neck: Normal range of motion. Neck supple. No muscular tenderness present.  Musculoskeletal:       Left shoulder: She exhibits decreased range of motion (But full Passive ROM. ) and tenderness. She exhibits no bony tenderness, no swelling, no effusion, no crepitus, no deformity (no step  off), no spasm, normal pulse and normal strength.       Arms: Neurological: She is alert and oriented to person, place, and time.  Skin: Skin is warm and dry. No rash noted. No erythema. No pallor.  Psychiatric: She has a normal mood and affect. Her behavior is normal.    ED Course  Procedures   Dg Shoulder Left  12/10/2011  *RADIOLOGY REPORT*  Clinical Data: Fall.  Left shoulder pain.  LEFT SHOULDER - 2+ VIEW  Comparison: None.  Findings: The left shoulder is located.  No acute bone or soft tissue abnormality is present.  Cardiomegaly is stable.  The patient is status post median sternotomy.  IMPRESSION:  1.  Negative left shoulder. 2.  Stable cardiomegaly.  Original Report Authenticated By: Jamesetta Orleans. MATTERN, M.D.     MDM   Patient's x-rays negative for acute bony injury. Advised patient that she may have tendon injury and should followup with orthopedic physician. I did advise patient against having a sling for the shoulder due to risk of frozen shoulder and worsening pain. patient is very upset with this and demands to have a shoulder sling. Advised patient and do not recommend this but I will give her a shoulder sling. Advised patient to keep her arm out of it more often than it is in its. Advised patient that she needs orthopedic followup she has continued pain. Patient also had a hip x-ray done last time which did not show acute bony injury. Advised patient she likely has bursitis of the left hip. Will give it additional Ultram for pain if  needed       Thomasene Lot, PA-C 12/10/11 1846

## 2011-12-19 ENCOUNTER — Ambulatory Visit: Payer: 59 | Admitting: Family

## 2011-12-19 ENCOUNTER — Ambulatory Visit: Payer: 59

## 2011-12-22 ENCOUNTER — Ambulatory Visit: Payer: 59 | Admitting: Family

## 2012-01-04 ENCOUNTER — Other Ambulatory Visit: Payer: Self-pay | Admitting: Family

## 2012-01-05 NOTE — Telephone Encounter (Signed)
#  45 with 2 refills pls

## 2012-01-05 NOTE — Telephone Encounter (Signed)
Please advise; re- refills

## 2012-01-05 NOTE — Telephone Encounter (Signed)
Refill sent to pharmacy.   

## 2012-01-19 ENCOUNTER — Encounter: Payer: Self-pay | Admitting: Family

## 2012-01-19 ENCOUNTER — Ambulatory Visit (INDEPENDENT_AMBULATORY_CARE_PROVIDER_SITE_OTHER): Payer: 59 | Admitting: Family

## 2012-01-19 VITALS — BP 130/88 | HR 94 | Temp 97.9°F | Resp 16 | Wt 174.0 lb

## 2012-01-19 DIAGNOSIS — E0590 Thyrotoxicosis, unspecified without thyrotoxic crisis or storm: Secondary | ICD-10-CM

## 2012-01-19 DIAGNOSIS — R51 Headache: Secondary | ICD-10-CM

## 2012-01-19 DIAGNOSIS — E538 Deficiency of other specified B group vitamins: Secondary | ICD-10-CM

## 2012-01-19 DIAGNOSIS — K219 Gastro-esophageal reflux disease without esophagitis: Secondary | ICD-10-CM

## 2012-01-19 DIAGNOSIS — F319 Bipolar disorder, unspecified: Secondary | ICD-10-CM

## 2012-01-19 DIAGNOSIS — Z5181 Encounter for therapeutic drug level monitoring: Secondary | ICD-10-CM

## 2012-01-19 MED ORDER — CYANOCOBALAMIN 1000 MCG/ML IJ SOLN
1000.0000 ug | Freq: Once | INTRAMUSCULAR | Status: AC
  Administered 2012-01-19: 1000 ug via INTRAMUSCULAR

## 2012-01-19 NOTE — Patient Instructions (Signed)
Please schedule a follow up appointment in 3 months. Complete your blood work prior to leaving.

## 2012-01-19 NOTE — Progress Notes (Signed)
Subjective:    Patient ID: Lynn Gonzalez, female    DOB: Jul 04, 1961, 51 y.o.   MRN: [id number]  HPI  Pt presents today for follow up.   GERD- currently on protonix.  Feels like gerd is well controlled.  Bipolar - on lithium and seroquel.  Headaches- reports tht last month she had episode of elevated bp.  No recent headaches.    B12 deficiency- due for injection today.   Review of Systems See HPI  Past Medical History  Diagnosis Date  . Bipolar affective disorder 1985    ?nervous breakdown  . Thyroid disease     ? overactive thyroid  . Breast tumor   . GERD (gastroesophageal reflux disease)     History   Social History  . Marital Status: Single    Spouse Name: N/A    Number of Children: N/A  . Years of Education: N/A   Occupational History  . [employer]   Social History Main Topics  . Smoking status: Never Smoker   . Smokeless tobacco: Never Used  . Alcohol Use: No  . Drug Use: No  . Sexually Active: Not on file   Other Topics Concern  . Not on file   Social History Narrative   singleRegular exercise:  Yes    Past Surgical History  Procedure Date  . Tubal ligation 1985, 1996  . Gallbladder surgery 1985    stones removed  . Mediastinoscopy 2001  . Sternotomy 2001  . Bunionectomy     bilateral  . Abdominal hysterectomy 06/03/10  . Knee surgery 1987    bilateral    Family History  Problem Relation Age of Onset  . Diabetes Mother   . Hypertension Mother   . Cancer Father     ?type  . Hypertension Father     No Known Allergies  Current Outpatient Prescriptions on File Prior to Visit  Medication Sig Dispense Refill  . cyanocobalamin (,VITAMIN B-12,) 1000 MCG/ML injection Inject 1,000 mcg into the muscle every 30 (thirty) days.        . diclofenac (VOLTAREN) 75 MG EC tablet Take 1 tablet (75 mg total) by mouth 2 (two) times daily.  30 tablet  0  . estradiol (ESTRACE) 2 MG tablet Take 1 tablet (2 mg total) by mouth daily.  30  tablet  2  . folic acid (FOLVITE) 1 MG tablet Take 1 tablet (1 mg total) by mouth daily.  30 tablet  5  . ibuprofen (ADVIL,MOTRIN) 600 MG tablet Take 600 mg by mouth every 8 (eight) hours as needed. For pain.      Marland Kitchen lithium 300 MG tablet TAKE 1 TABLET BY MOUTH EVERY MORNING TAKE 2 TABLETS BY MOUTH EVERY DAY AT BEDTIME  90 tablet  0  . pantoprazole (PROTONIX) 40 MG tablet Take 1 tablet (40 mg total) by mouth daily.  30 tablet  5  . PROAIR HFA 108 (90 BASE) MCG/ACT inhaler INHALE 2 PUFFS INTO THE LUNGS EVERY 6 HOURS AS NEEDED FOR WHEEZING.  8.5 g  2  . QUEtiapine (SEROQUEL) 200 MG tablet TAKE 1 & 1/2 TABLETS BY MOUTH AT BEDTIME  45 tablet  2    BP 130/88  Pulse 94  Temp(Src) 97.9 F (36.6 C) (Oral)  Resp 16  Wt 174 lb (78.926 kg)  SpO2 99%  LMP 04/18/2010       Objective:   Physical Exam  Constitutional: She appears well-developed and well-nourished. No distress.  Cardiovascular: Normal rate and regular rhythm.   No murmur heard. Pulmonary/Chest: Effort normal and breath sounds normal. No respiratory distress. She has no wheezes. She has no rales. She exhibits no tenderness.  Musculoskeletal: She exhibits no edema.  Psychiatric: She has a normal mood and affect. Her behavior is normal. Judgment and thought content normal.          Assessment & Plan:

## 2012-01-20 LAB — BASIC METABOLIC PANEL
Calcium: 9.4 mg/dL (ref 8.4–10.5)
Glucose, Bld: 78 mg/dL (ref 70–99)
Sodium: 137 mEq/L (ref 135–145)

## 2012-01-20 LAB — HEPATIC FUNCTION PANEL
ALT: 9 U/L (ref 0–35)
AST: 17 U/L (ref 0–37)
Albumin: 4.2 g/dL (ref 3.5–5.2)
Bilirubin, Direct: 0.1 mg/dL (ref 0.0–0.3)
Total Protein: 7.4 g/dL (ref 6.0–8.3)

## 2012-01-21 ENCOUNTER — Encounter: Payer: Self-pay | Admitting: Family

## 2012-01-22 DIAGNOSIS — K219 Gastro-esophageal reflux disease without esophagitis: Secondary | ICD-10-CM | POA: Insufficient documentation

## 2012-01-22 NOTE — Assessment & Plan Note (Signed)
Stable on protonix.  Continue same.  

## 2012-01-22 NOTE — Assessment & Plan Note (Signed)
Stable on lithium and seroquel.  Obtain lithium level.

## 2012-01-22 NOTE — Assessment & Plan Note (Signed)
Stable.  Monitor.  

## 2012-01-22 NOTE — Assessment & Plan Note (Signed)
B12 injection today 

## 2012-01-27 ENCOUNTER — Ambulatory Visit: Payer: 59 | Admitting: Family

## 2012-02-24 ENCOUNTER — Ambulatory Visit (INDEPENDENT_AMBULATORY_CARE_PROVIDER_SITE_OTHER): Payer: 59 | Admitting: Family

## 2012-02-24 DIAGNOSIS — E538 Deficiency of other specified B group vitamins: Secondary | ICD-10-CM

## 2012-02-24 MED ORDER — CYANOCOBALAMIN 1000 MCG/ML IJ SOLN
1000.0000 ug | Freq: Once | INTRAMUSCULAR | Status: AC
  Administered 2012-02-24: 1000 ug via INTRAMUSCULAR

## 2012-02-25 ENCOUNTER — Ambulatory Visit: Payer: 59 | Admitting: Family

## 2012-03-01 ENCOUNTER — Encounter: Payer: Self-pay | Admitting: Family

## 2012-03-01 ENCOUNTER — Telehealth: Payer: Self-pay | Admitting: Family

## 2012-03-01 ENCOUNTER — Ambulatory Visit (HOSPITAL_BASED_OUTPATIENT_CLINIC_OR_DEPARTMENT_OTHER)
Admission: RE | Admit: 2012-03-01 | Discharge: 2012-03-01 | Disposition: A | Discharge status: Home / Self Care | Payer: Medicare Other | Source: Ambulatory Visit | Attending: Family | Admitting: Family

## 2012-03-01 ENCOUNTER — Ambulatory Visit (INDEPENDENT_AMBULATORY_CARE_PROVIDER_SITE_OTHER): Payer: 59 | Admitting: Family

## 2012-03-01 VITALS — BP 132/88 | HR 74 | Temp 98.0°F | Resp 18 | Ht 65.0 in | Wt 178.0 lb

## 2012-03-01 DIAGNOSIS — M25559 Pain in unspecified hip: Secondary | ICD-10-CM

## 2012-03-01 DIAGNOSIS — M25552 Pain in left hip: Secondary | ICD-10-CM

## 2012-03-01 DIAGNOSIS — M7989 Other specified soft tissue disorders: Secondary | ICD-10-CM

## 2012-03-01 DIAGNOSIS — B351 Tinea unguium: Secondary | ICD-10-CM

## 2012-03-01 DIAGNOSIS — M79609 Pain in unspecified limb: Secondary | ICD-10-CM | POA: Insufficient documentation

## 2012-03-01 NOTE — Telephone Encounter (Signed)
LE duplex negative for DVT.   Left detailed message on cell phone.

## 2012-03-01 NOTE — Progress Notes (Signed)
Subjective:    Patient ID: Lynn Gonzalez, female    DOB: 04-Feb-1961, 51 y.o.   MRN: [id number]  HPI  Lynn Gonzalez is a 51 yr old female who presents today with chief complaint of left leg swelling/burning.  She denies associated fever.  Symptoms   Left leg swelling/burning.  No fever.  + homans.    L Hip pain-  Reports that last Thursday she was packing some things ups.  Worsened thurs/friday- no improvement with aleve.  No injury.   Review of Systems See HPI  Past Medical History  Diagnosis Date  . Bipolar affective disorder 1985    ?nervous breakdown  . Thyroid disease     ? overactive thyroid  . Breast tumor   . GERD (gastroesophageal reflux disease)     History   Social History  . Marital Status: Single    Spouse Name: N/A    Number of Children: N/A  . Years of Education: N/A   Occupational History  . [employer]     [employer]   Social History Main Topics  . Smoking status: Never Smoker   . Smokeless tobacco: Never Used  . Alcohol Use: No  . Drug Use: No  . Sexually Active: Not on file   Other Topics Concern  . Not on file   Social History Narrative   singleRegular exercise:  Yes    Past Surgical History  Procedure Date  . Tubal ligation 1985, 1996  . Gallbladder surgery 1985    stones removed  . Mediastinoscopy 2001  . Sternotomy 2001  . Bunionectomy     bilateral  . Abdominal hysterectomy 06/03/10  . Knee surgery 1987    bilateral    Family History  Problem Relation Age of Onset  . Diabetes Mother   . Hypertension Mother   . Cancer Father     ?type  . Hypertension Father     No Known Allergies  Current Outpatient Prescriptions on File Prior to Visit  Medication Sig Dispense Refill  . cyanocobalamin (,VITAMIN B-12,) 1000 MCG/ML injection Inject 1,000 mcg into the muscle every 30 (thirty) days.        . diclofenac (VOLTAREN) 75 MG EC tablet Take 1 tablet (75 mg total) by mouth 2 (two) times daily.  30 tablet  0  . estradiol (ESTRACE) 2  MG tablet Take 1 tablet (2 mg total) by mouth daily.  30 tablet  2  . folic acid (FOLVITE) 1 MG tablet Take 1 tablet (1 mg total) by mouth daily.  30 tablet  5  . lithium 300 MG tablet TAKE 1 TABLET BY MOUTH EVERY MORNING TAKE 2 TABLETS BY MOUTH EVERY DAY AT BEDTIME  90 tablet  0  . pantoprazole (PROTONIX) 40 MG tablet Take 1 tablet (40 mg total) by mouth daily.  30 tablet  5  . PROAIR HFA 108 (90 BASE) MCG/ACT inhaler INHALE 2 PUFFS INTO THE LUNGS EVERY 6 HOURS AS NEEDED FOR WHEEZING.  8.5 g  2  . QUEtiapine (SEROQUEL) 200 MG tablet TAKE 1 & 1/2 TABLETS BY MOUTH AT BEDTIME  45 tablet  2    BP 132/88  Pulse 74  Temp 98 F (36.7 C) (Oral)  Resp 18  Ht 5\' 5"  (1.651 m)  Wt 178 lb (80.74 kg)  BMI 29.62 kg/m2  SpO2 99%  LMP 04/18/2010       Objective:   Physical Exam  Constitutional: She appears well-developed and well-nourished. No distress.  Cardiovascular:  Normal rate and regular rhythm.   No murmur heard. Pulmonary/Chest: Effort normal and breath sounds normal. No respiratory distress. She has no wheezes. She has no rales. She exhibits no tenderness.  Musculoskeletal:       + left hip pain with flexion.    Left medial shin tender/swollen.    Psychiatric: She has a normal mood and affect. Her behavior is normal. Judgment and thought content normal.          Assessment & Plan:

## 2012-03-01 NOTE — Patient Instructions (Addendum)
You may use aleve 220mg  twice daily as needed for the next 1 week.  Call if your hip pain worsens, or if no improvement with the aleve.

## 2012-03-04 DIAGNOSIS — M7989 Other specified soft tissue disorders: Secondary | ICD-10-CM | POA: Insufficient documentation

## 2012-03-04 DIAGNOSIS — M25552 Pain in left hip: Secondary | ICD-10-CM | POA: Insufficient documentation

## 2012-03-04 NOTE — Assessment & Plan Note (Addendum)
Likely OA.  Trial of aleve.  Consider X-ray if symptoms worsen, or if no improvement.

## 2012-03-04 NOTE — Assessment & Plan Note (Signed)
LLE doppler obtained- neg for DVT.

## 2012-03-23 ENCOUNTER — Encounter (HOSPITAL_COMMUNITY): Payer: Self-pay | Admitting: *Deleted

## 2012-03-23 ENCOUNTER — Emergency Department (HOSPITAL_COMMUNITY)
Admission: EM | Admit: 2012-03-23 | Discharge: 2012-03-23 | Disposition: A | Discharge status: Home / Self Care | Payer: Medicare Other | Attending: Emergency Medicine | Admitting: Emergency Medicine

## 2012-03-23 DIAGNOSIS — K219 Gastro-esophageal reflux disease without esophagitis: Secondary | ICD-10-CM | POA: Insufficient documentation

## 2012-03-23 DIAGNOSIS — S61209A Unspecified open wound of unspecified finger without damage to nail, initial encounter: Secondary | ICD-10-CM | POA: Insufficient documentation

## 2012-03-23 DIAGNOSIS — W268XXA Contact with other sharp object(s), not elsewhere classified, initial encounter: Secondary | ICD-10-CM | POA: Insufficient documentation

## 2012-03-23 DIAGNOSIS — Z23 Encounter for immunization: Secondary | ICD-10-CM | POA: Insufficient documentation

## 2012-03-23 DIAGNOSIS — S61219A Laceration without foreign body of unspecified finger without damage to nail, initial encounter: Secondary | ICD-10-CM

## 2012-03-23 DIAGNOSIS — F319 Bipolar disorder, unspecified: Secondary | ICD-10-CM | POA: Insufficient documentation

## 2012-03-23 DIAGNOSIS — E079 Disorder of thyroid, unspecified: Secondary | ICD-10-CM | POA: Insufficient documentation

## 2012-03-23 MED ORDER — TETANUS-DIPHTHERIA TOXOIDS TD 5-2 LFU IM INJ
0.5000 mL | INJECTION | Freq: Once | INTRAMUSCULAR | Status: AC
  Administered 2012-03-23: 0.5 mL via INTRAMUSCULAR
  Filled 2012-03-23: qty 0.5

## 2012-03-23 NOTE — ED Notes (Signed)
Pt cut right hand pointer finger while opening can

## 2012-03-23 NOTE — ED Notes (Signed)
Pt has laceration to dorsal side of right pointer finger. Bleeding controlled.

## 2012-03-23 NOTE — ED Provider Notes (Signed)
History     CSN: [id number]  Arrival date & time 03/23/12  0043   First MD Initiated Contact with Patient 03/23/12 0117      Chief Complaint  Patient presents with  . Laceration    (Consider location/radiation/quality/duration/timing/severity/associated sxs/prior treatment) HPI Comments: Patient was drinking a "beans, and she lacerated the dorsal aspect of her right index, finger  Patient is a 51 y.o. female presenting with skin laceration. The history is provided by the patient.  Laceration  The incident occurred 1 to 2 hours ago. The laceration is 2 cm in size. The laceration mechanism was a a metal edge. The pain is at a severity of 4/10. The pain is mild. The pain has been constant since onset. She reports no foreign bodies present. Her tetanus status is unknown.    Past Medical History  Diagnosis Date  . Bipolar affective disorder 1985    ?nervous breakdown  . Thyroid disease     ? overactive thyroid  . Breast tumor   . GERD (gastroesophageal reflux disease)     Past Surgical History  Procedure Date  . Tubal ligation 1985, 1996  . Gallbladder surgery 1985    stones removed  . Mediastinoscopy 2001  . Sternotomy 2001  . Bunionectomy     bilateral  . Abdominal hysterectomy 06/03/10  . Knee surgery 1987    bilateral    Family History  Problem Relation Age of Onset  . Diabetes Mother   . Hypertension Mother   . Cancer Father     ?type  . Hypertension Father     History  Substance Use Topics  . Smoking status: Never Smoker   . Smokeless tobacco: Never Used  . Alcohol Use: No    OB History    Grav Para Term Preterm Abortions TAB SAB Ect Mult Living                  Review of Systems  Constitutional: Negative for fever.  Musculoskeletal: Negative for joint swelling.  Skin: Positive for wound.  Neurological: Negative for dizziness and headaches.    Allergies  Review of patient's allergies indicates no known allergies.  Home Medications    Current Outpatient Rx  Name Route Sig Dispense Refill  . CYANOCOBALAMIN 1000 MCG/ML IJ SOLN Intramuscular Inject 1,000 mcg into the muscle every 30 (thirty) days.      Marland Kitchen FOLIC ACID 1 MG PO TABS Oral Take 1 tablet (1 mg total) by mouth daily. 30 tablet 5  . LITHIUM CARBONATE 300 MG PO TABS  TAKE 1 TABLET BY MOUTH EVERY MORNING TAKE 2 TABLETS BY MOUTH EVERY DAY AT BEDTIME 90 tablet 0  . PANTOPRAZOLE SODIUM 40 MG PO TBEC Oral Take 1 tablet (40 mg total) by mouth daily. 30 tablet 5  . PROAIR HFA 108 (90 BASE) MCG/ACT IN AERS  INHALE 2 PUFFS INTO THE LUNGS EVERY 6 HOURS AS NEEDED FOR WHEEZING. 8.5 g 2  . QUETIAPINE FUMARATE 200 MG PO TABS  TAKE 1 & 1/2 TABLETS BY MOUTH AT BEDTIME 45 tablet 2    BP 139/95  Pulse 70  Temp 98 F (36.7 C) (Oral)  Resp 20  SpO2 100%  LMP 04/18/2010  Physical Exam  Constitutional: She appears well-developed and well-nourished.  HENT:  Head: Normocephalic.  Eyes: Pupils are equal, round, and reactive to light.  Neck: Normal range of motion.  Cardiovascular: Normal rate.   Pulmonary/Chest: Effort normal.  Musculoskeletal: Normal range of motion. She exhibits no tenderness.  Neurological: She is alert.  Skin: Skin is warm. No rash noted.       2 cm laceration to the dorsal aspect of the right index, finger, just medial to the PIP joint    ED Course  LACERATION REPAIR Date/Time: 03/23/2012 2:08 AM Performed by: Arman Filter Authorized by: Arman Filter Consent: Verbal consent obtained. Consent given by: patient Patient understanding: patient states understanding of the procedure being performed Patient identity confirmed: verbally with patient Body area: upper extremity Location details: right index finger Foreign bodies: metal Tendon involvement: none Nerve involvement: none Vascular damage: no Anesthesia: local infiltration Local anesthetic: lidocaine 2% without epinephrine Anesthetic total: 1 ml Patient sedated: no Irrigation solution:  saline Amount of cleaning: standard Debridement: none Degree of undermining: none Skin closure: 6-0 Prolene Number of sutures: 4 Technique: simple Approximation difficulty: simple Patient tolerance: Patient tolerated the procedure well with no immediate complications.   (including critical care time)  Labs Reviewed - No data to display No results found.   1. Laceration of finger       MDM   Sutures finger laceration         Arman Filter, NP 03/23/12 0210  Arman Filter, NP 03/23/12 1191

## 2012-03-23 NOTE — ED Notes (Signed)
Pt was opening a can and cut tip of right pointer finger. Pt unsure of when she had a tetanus shot

## 2012-03-23 NOTE — ED Provider Notes (Signed)
Medical screening examination/treatment/procedure(s) were performed by non-physician practitioner and as supervising physician I was immediately available for consultation/collaboration.   Hanley Seamen, MD 03/23/12 [phone]

## 2012-03-23 NOTE — ED Notes (Signed)
Wound cleaned.  Pt given a warm blanket

## 2012-03-24 ENCOUNTER — Encounter (HOSPITAL_COMMUNITY): Payer: Self-pay | Admitting: Emergency Medicine

## 2012-03-24 ENCOUNTER — Emergency Department (HOSPITAL_COMMUNITY)
Admission: EM | Admit: 2012-03-24 | Discharge: 2012-03-24 | Disposition: A | Discharge status: Home / Self Care | Payer: Medicare Other | Attending: Emergency Medicine | Admitting: Emergency Medicine

## 2012-03-24 DIAGNOSIS — K219 Gastro-esophageal reflux disease without esophagitis: Secondary | ICD-10-CM | POA: Insufficient documentation

## 2012-03-24 DIAGNOSIS — F319 Bipolar disorder, unspecified: Secondary | ICD-10-CM | POA: Insufficient documentation

## 2012-03-24 DIAGNOSIS — Z8249 Family history of ischemic heart disease and other diseases of the circulatory system: Secondary | ICD-10-CM | POA: Insufficient documentation

## 2012-03-24 DIAGNOSIS — S61219A Laceration without foreign body of unspecified finger without damage to nail, initial encounter: Secondary | ICD-10-CM

## 2012-03-24 DIAGNOSIS — W268XXA Contact with other sharp object(s), not elsewhere classified, initial encounter: Secondary | ICD-10-CM | POA: Insufficient documentation

## 2012-03-24 DIAGNOSIS — Z90710 Acquired absence of both cervix and uterus: Secondary | ICD-10-CM | POA: Insufficient documentation

## 2012-03-24 DIAGNOSIS — Z833 Family history of diabetes mellitus: Secondary | ICD-10-CM | POA: Insufficient documentation

## 2012-03-24 DIAGNOSIS — S61209A Unspecified open wound of unspecified finger without damage to nail, initial encounter: Secondary | ICD-10-CM | POA: Insufficient documentation

## 2012-03-24 DIAGNOSIS — E079 Disorder of thyroid, unspecified: Secondary | ICD-10-CM | POA: Insufficient documentation

## 2012-03-24 DIAGNOSIS — Z809 Family history of malignant neoplasm, unspecified: Secondary | ICD-10-CM | POA: Insufficient documentation

## 2012-03-24 NOTE — ED Notes (Signed)
Pt presenting to ed with c/o suture coming out pt states she was here yesterday and received sutures pt states she noticed that bandage was full of blood

## 2012-03-24 NOTE — ED Provider Notes (Signed)
History     CSN: [id number]  Arrival date & time 03/24/12  1406   First MD Initiated Contact with Patient 03/24/12 1607      Chief Complaint  Patient presents with  . Laceration    (Consider location/radiation/quality/duration/timing/severity/associated sxs/prior treatment) HPI Comments: Patient presents with bleeding from previously sutured right index finger. Patient cut her finger on a metal edge approximately 2 days ago. She received 4 stitches in her finger. Patient noted that after she took a shower this morning the finger blood for about 2 hours. It is not currently bleeding. Patient has not had increased pain or swelling in the finger. She continues wound care at home. Patient states that she bends her finger at times. She is right-handed and she needs to use that hand. She is requesting a splint for her finger. Onset was acute. Course is constant. Nothing makes symptoms better. Movement of the finger makes the pain worse.  Patient is a 51 y.o. female presenting with skin laceration. The history is provided by the patient.  Laceration  The incident occurred 2 days ago. The laceration is located on the right hand. The laceration mechanism was a a metal edge. The pain is mild.    Past Medical History  Diagnosis Date  . Bipolar affective disorder 1985    ?nervous breakdown  . Thyroid disease     ? overactive thyroid  . Breast tumor   . GERD (gastroesophageal reflux disease)     Past Surgical History  Procedure Date  . Tubal ligation 1985, 1996  . Gallbladder surgery 1985    stones removed  . Mediastinoscopy 2001  . Sternotomy 2001  . Bunionectomy     bilateral  . Abdominal hysterectomy 06/03/10  . Knee surgery 1987    bilateral    Family History  Problem Relation Age of Onset  . Diabetes Mother   . Hypertension Mother   . Cancer Father     ?type  . Hypertension Father     History  Substance Use Topics  . Smoking status: Never Smoker   . Smokeless tobacco:  Never Used  . Alcohol Use: No    OB History    Grav Para Term Preterm Abortions TAB SAB Ect Mult Living                  Review of Systems  Constitutional: Negative for fever and activity change.  Musculoskeletal: Negative for back pain, joint swelling and arthralgias.  Skin: Positive for wound.  Neurological: Negative for weakness and numbness.    Allergies  Review of patient's allergies indicates no known allergies.  Home Medications   Current Outpatient Rx  Name Route Sig Dispense Refill  . CYANOCOBALAMIN 1000 MCG/ML IJ SOLN Intramuscular Inject 1,000 mcg into the muscle every 30 (thirty) days.      Marland Kitchen FOLIC ACID 1 MG PO TABS Oral Take 1 tablet (1 mg total) by mouth daily. 30 tablet 5  . LITHIUM CARBONATE 300 MG PO TABS  TAKE 1 TABLET BY MOUTH EVERY MORNING TAKE 2 TABLETS BY MOUTH EVERY DAY AT BEDTIME 90 tablet 0  . PANTOPRAZOLE SODIUM 40 MG PO TBEC Oral Take 1 tablet (40 mg total) by mouth daily. 30 tablet 5  . PROAIR HFA 108 (90 BASE) MCG/ACT IN AERS  INHALE 2 PUFFS INTO THE LUNGS EVERY 6 HOURS AS NEEDED FOR WHEEZING. 8.5 g 2  . QUETIAPINE FUMARATE 200 MG PO TABS  TAKE 1 & 1/2 TABLETS BY MOUTH AT BEDTIME  45 tablet 2    BP 127/103  Pulse 79  Temp 99.1 F (37.3 C) (Oral)  Resp 20  SpO2 100%  LMP 04/18/2010  Physical Exam  Nursing note and vitals reviewed. Constitutional: She appears well-developed and well-nourished.  HENT:  Head: Normocephalic and atraumatic.  Eyes: Pupils are equal, round, and reactive to light.  Neck: Normal range of motion. Neck supple.  Cardiovascular: Exam reveals no decreased pulses.   Musculoskeletal: She exhibits tenderness. She exhibits no edema.       Patient with healing 2 cm laceration to dorsum of right index finger. The area appears to be healing well. There are 4 Prolene stitches in place. There is no surrounding redness or erythema. Patient can flex finger with mild pain. There is no evidence of flexor tenosynovitis or cellulitis.  Cap refill is less than 2 seconds.  Neurological: She is alert. No sensory deficit.       Motor, sensation, and vascular distal to the injury is fully intact.   Skin: Skin is warm and dry.  Psychiatric: She has a normal mood and affect.    ED Course  Procedures (including critical care time)  Labs Reviewed - No data to display No results found.   1. Laceration of finger     4:43 PM Patient seen and examined. Wound cleaned with dermal cleanser and scrub. No active bleeding. Finger splint by ortho tech. Patient to return in 9 days for suture removal.   Vital signs reviewed and are as follows: Filed Vitals:   03/24/12 1416  BP: 127/103  Pulse: 79  Temp: 99.1 F (37.3 C)  Resp: 20   The patient was urged to return to the Emergency Department urgently with worsening pain, swelling, expanding erythema especially if it streaks away from the affected area, fever, or if they have any other concerns. Patient verbalized understanding.     MDM  Bleeding from previously repaired laceration. Area is hemostatic. It appears well healing without any evidence of infection, tenosynovitis. Refill is brisk. Will immobilize and outpatient followup as needed with return for suture removal in 8-9 days.        Renne Crigler, [place] 03/24/12 [phone]

## 2012-03-24 NOTE — ED Provider Notes (Signed)
Medical screening examination/treatment/procedure(s) were performed by non-physician practitioner and as supervising physician I was immediately available for consultation/collaboration.   Loren Racer, MD 03/24/12 2229

## 2012-03-29 ENCOUNTER — Emergency Department (HOSPITAL_COMMUNITY)
Admission: EM | Admit: 2012-03-29 | Discharge: 2012-03-29 | Disposition: A | Discharge status: Home / Self Care | Payer: Medicare Other | Attending: Emergency Medicine | Admitting: Emergency Medicine

## 2012-03-29 ENCOUNTER — Encounter (HOSPITAL_COMMUNITY): Payer: Self-pay | Admitting: *Deleted

## 2012-03-29 ENCOUNTER — Emergency Department (HOSPITAL_COMMUNITY): Payer: Medicare Other

## 2012-03-29 DIAGNOSIS — K219 Gastro-esophageal reflux disease without esophagitis: Secondary | ICD-10-CM | POA: Insufficient documentation

## 2012-03-29 DIAGNOSIS — R42 Dizziness and giddiness: Secondary | ICD-10-CM | POA: Insufficient documentation

## 2012-03-29 DIAGNOSIS — E079 Disorder of thyroid, unspecified: Secondary | ICD-10-CM | POA: Insufficient documentation

## 2012-03-29 DIAGNOSIS — F319 Bipolar disorder, unspecified: Secondary | ICD-10-CM | POA: Insufficient documentation

## 2012-03-29 DIAGNOSIS — R51 Headache: Secondary | ICD-10-CM | POA: Insufficient documentation

## 2012-03-29 DIAGNOSIS — Z79899 Other long term (current) drug therapy: Secondary | ICD-10-CM | POA: Insufficient documentation

## 2012-03-29 DIAGNOSIS — R55 Syncope and collapse: Secondary | ICD-10-CM | POA: Insufficient documentation

## 2012-03-29 LAB — CBC WITH DIFFERENTIAL/PLATELET
Basophils Absolute: 0 10*3/uL (ref 0.0–0.1)
Eosinophils Absolute: 0.1 10*3/uL (ref 0.0–0.7)
Eosinophils Relative: 2 % (ref 0–5)
Lymphs Abs: 1.4 10*3/uL (ref 0.7–4.0)
MCH: 26.5 pg (ref 26.0–34.0)
Neutrophils Relative %: 66 % (ref 43–77)
Platelets: 142 10*3/uL — ABNORMAL LOW (ref 150–400)
RBC: 4.64 MIL/uL (ref 3.87–5.11)
RDW: 13 % (ref 11.5–15.5)
WBC: 5.8 10*3/uL (ref 4.0–10.5)

## 2012-03-29 LAB — POCT I-STAT, CHEM 8
BUN: 9 mg/dL (ref 6–23)
Calcium, Ion: 1.26 mmol/L — ABNORMAL HIGH (ref 1.12–1.23)
HCT: 42 % (ref 36.0–46.0)
Hemoglobin: 14.3 g/dL (ref 12.0–15.0)
Sodium: 141 mEq/L (ref 135–145)
TCO2: 24 mmol/L (ref 0–100)

## 2012-03-29 LAB — URINALYSIS, ROUTINE W REFLEX MICROSCOPIC
Glucose, UA: NEGATIVE mg/dL
Ketones, ur: NEGATIVE mg/dL
Leukocytes, UA: NEGATIVE
Nitrite: NEGATIVE
Protein, ur: NEGATIVE mg/dL
Urobilinogen, UA: 0.2 mg/dL (ref 0.0–1.0)

## 2012-03-29 MED ORDER — IBUPROFEN 600 MG PO TABS: 600.0000 mg | ORAL_TABLET | Freq: Four times a day (QID) | ORAL | Status: AC | PRN

## 2012-03-29 MED ORDER — IBUPROFEN 800 MG PO TABS
800.0000 mg | ORAL_TABLET | Freq: Once | ORAL | Status: AC
  Administered 2012-03-29: 800 mg via ORAL
  Filled 2012-03-29: qty 1

## 2012-03-29 MED ORDER — SODIUM CHLORIDE 0.9 % IV BOLUS (SEPSIS)
1000.0000 mL | Freq: Once | INTRAVENOUS | Status: AC
  Administered 2012-03-29: 1000 mL via INTRAVENOUS

## 2012-03-29 MED ORDER — MORPHINE SULFATE 4 MG/ML IJ SOLN: 4.0000 mg | Freq: Once | INTRAMUSCULAR | Status: DC

## 2012-03-29 MED ORDER — MECLIZINE HCL 25 MG PO TABS
25.0000 mg | ORAL_TABLET | Freq: Once | ORAL | Status: AC
  Administered 2012-03-29: 25 mg via ORAL
  Filled 2012-03-29: qty 1

## 2012-03-29 MED ORDER — MECLIZINE HCL 50 MG PO TABS: 25.0000 mg | ORAL_TABLET | Freq: Three times a day (TID) | ORAL | Status: AC | PRN

## 2012-03-29 NOTE — ED Notes (Signed)
Reports has had intermittent episodes of dizziness x 1 month, denies presently. Continues to c/o numbness to left side neck. Denies n/v, fever, cold, cough.

## 2012-03-29 NOTE — ED Notes (Addendum)
Pt c/o dizziness & then had a syncopal episode at the [facility], now c/o posterior head pain & left side neck & LUE numbness. Pt A&OX4 upon arrival. Denies dizziness presently but continues with the numbness.

## 2012-03-29 NOTE — ED Provider Notes (Signed)
History     CSN: [id number]  Arrival date & time 03/29/12  1438   First MD Initiated Contact with Patient 03/29/12 1508      Chief Complaint  Patient presents with  . Near Syncope  . Fall    (Consider location/radiation/quality/duration/timing/severity/associated sxs/prior treatment) HPI  51 year old female with hx of bipolar, and thyroid disease presents for evaluation of syncope.  Pt reports she was walking to the bus stop earlier today when she felt dizzy (room spinning).  Pt tries to hold on to nearby objects (wall, fence) and walk her way to a nearby store to call for an ambulance.  Sts she was given a phone but prior to calling she had a syncopal episode.  She remember woke up with people surrounding her and the EMS were there.  Reports having a mild headache to the back of her head, with tingling sensation to her L shoulder and arm which has improved. Initially were having pain to her neck but sts it has resolved.  Endorse feeling lightheadedness with walking.  Denies changes in vision, tongue biting, cp, sob, abd pain, n/v, or back pain.  Denies urinary sxs.  No prior hx of seizure.  Denies recent alcohol or rec drug use.  No menstruation 2/2 hysterectomy.  Pt reports having similar dizziness 3 weeks ago but only lasting for minutes.  Has been eating and drinking as usual.    Past Medical History  Diagnosis Date  . Bipolar affective disorder 1985    ?nervous breakdown  . Thyroid disease     ? overactive thyroid  . Breast tumor   . GERD (gastroesophageal reflux disease)     Past Surgical History  Procedure Date  . Tubal ligation 1985, 1996  . Gallbladder surgery 1985    stones removed  . Mediastinoscopy 2001  . Sternotomy 2001  . Bunionectomy     bilateral  . Abdominal hysterectomy 06/03/10  . Knee surgery 1987    bilateral    Family History  Problem Relation Age of Onset  . Diabetes Mother   . Hypertension Mother   . Cancer Father     ?type  . Hypertension  Father     History  Substance Use Topics  . Smoking status: Never Smoker   . Smokeless tobacco: Never Used  . Alcohol Use: No    OB History    Grav Para Term Preterm Abortions TAB SAB Ect Mult Living                  Review of Systems  All other systems reviewed and are negative.    Allergies  Review of patient's allergies indicates no known allergies.  Home Medications   Current Outpatient Rx  Name Route Sig Dispense Refill  . ALBUTEROL SULFATE HFA 108 (90 BASE) MCG/ACT IN AERS Inhalation Inhale 2 puffs into the lungs every 6 (six) hours as needed. For shortness of breath.    Marland Kitchen VITAMIN B-12 IJ Injection Inject as directed every 30 (thirty) days. Next dose due this week.    Marland Kitchen FOLIC ACID 1 MG PO TABS Oral Take 1 mg by mouth daily.    Marland Kitchen LITHIUM CARBONATE 300 MG PO TABS Oral Take 300-600 mg by mouth 2 (two) times daily. Take 1 tablet every morning and take 2 tablets every night at bedtime.    Marland Kitchen PANTOPRAZOLE SODIUM 40 MG PO TBEC Oral Take 1 tablet (40 mg total) by mouth daily. 30 tablet 5  . QUETIAPINE FUMARATE  200 MG PO TABS Oral Take 300 mg by mouth at bedtime.      BP 141/97  Pulse 84  Temp 98.2 F (36.8 C) (Oral)  Resp 16  SpO2 99%  LMP 04/18/2010  Physical Exam  Nursing note and vitals reviewed. Constitutional: She is oriented to person, place, and time. She appears well-developed and well-nourished.       Awake, alert, nontoxic appearance.  Tearful.  On Spine board and C-collar.  HENT:  Head: Normocephalic and atraumatic.  Right Ear: External ear normal.  Left Ear: External ear normal.  Nose: Nose normal.  Mouth/Throat: Oropharynx is clear and moist. No oropharyngeal exudate.       Scalp nontender on palpation.  No tongue biting.  Normal TMs bilat.    Eyes: Conjunctivae and EOM are normal. Pupils are equal, round, and reactive to light. Right eye exhibits no discharge. Left eye exhibits no discharge.       No nystagmus  Neck: Normal range of motion. Neck  supple.       No midline cervical spine tenderness  Cardiovascular: Normal rate and regular rhythm.  Exam reveals no gallop and no friction rub.   No murmur heard. Pulmonary/Chest: Effort normal. No respiratory distress. She has no wheezes. She exhibits no tenderness.  Abdominal: Soft. There is no tenderness. There is no rebound and no guarding.  Musculoskeletal: Normal range of motion. She exhibits no edema and no tenderness.       ROM appears intact, no obvious focal weakness  Neurological: She is alert and oriented to person, place, and time. She has normal strength. She displays normal reflexes. No cranial nerve deficit. She exhibits normal muscle tone. She displays a negative Romberg sign. Coordination and gait normal. GCS eye subscore is 4. GCS verbal subscore is 5. GCS motor subscore is 6.  Reflex Scores:      Patellar reflexes are 2+ on the right side and 2+ on the left side.      Mental status and motor strength appears intact.  Normal grip strength bilat.  Normal sensation to upper extremity to light touch bilat.    Skin: Skin is warm. No rash noted.  Psychiatric: She has a normal mood and affect.    ED Course  Procedures (including critical care time)  Labs Reviewed - No data to display No results found.   No diagnosis found.   Date: 03/29/2012  Rate: 80  Rhythm: normal sinus rhythm  QRS Axis: normal  Intervals: PR prolonged  ST/T Wave abnormalities: nonspecific T wave changes  Conduction Disutrbances:first-degree A-V block   Narrative Interpretation:   Old EKG Reviewed: unchanged  16:08 Orthostatic Vital Signs Orthstatic P/BP - Pulse Rate: 81 ; Pulse Rate Source: Dinamap ; BP: 144/96 ; BP Location: Right arm ; BP Method: Automatic ; Patient Position, if appropriate: Lying Almeta Monas, EMT 16:09 Orthostatic Vital Signs Orthstatic P/BP - Pulse Rate: 81 ; Pulse Rate Source: Dinamap ; BP: 144/95 ; BP Location: Right arm ; BP Method: Automatic ; Patient Position, if  appropriate: Sitting Almeta Monas, EMT 16:10 Orthostatic Vital Signs Orthstatic P/BP - Pulse Rate: 81 ; Pulse Rate Source: Dinamap ; BP: 143/93 ; BP Location: Right arm ; BP Method: Automatic ; Patient Position, if appropriate: Standing Almeta Monas, EMT  Results for orders placed during the hospital encounter of 03/29/12  CBC WITH DIFFERENTIAL      Component Value Range   WBC 5.8  4.0 - 10.5 K/uL   RBC  4.64  3.87 - 5.11 MIL/uL   Hemoglobin 12.3  12.0 - 15.0 g/dL   HCT 10.2  72.5 - 36.6 %   MCV 85.8  78.0 - 100.0 fL   MCH 26.5  26.0 - 34.0 pg   MCHC 30.9  30.0 - 36.0 g/dL   RDW 44.0  34.7 - 42.5 %   Platelets 142 (*) 150 - 400 K/uL   Neutrophils Relative 66  43 - 77 %   Neutro Abs 3.9  1.7 - 7.7 K/uL   Lymphocytes Relative 24  12 - 46 %   Lymphs Abs 1.4  0.7 - 4.0 K/uL   Monocytes Relative 7  3 - 12 %   Monocytes Absolute 0.4  0.1 - 1.0 K/uL   Eosinophils Relative 2  0 - 5 %   Eosinophils Absolute 0.1  0.0 - 0.7 K/uL   Basophils Relative 1  0 - 1 %   Basophils Absolute 0.0  0.0 - 0.1 K/uL  URINALYSIS, ROUTINE W REFLEX MICROSCOPIC      Component Value Range   Color, Urine YELLOW  YELLOW   APPearance CLEAR  CLEAR   Specific Gravity, Urine 1.019  1.005 - 1.030   pH 5.5  5.0 - 8.0   Glucose, UA NEGATIVE  NEGATIVE mg/dL   Hgb urine dipstick NEGATIVE  NEGATIVE   Bilirubin Urine NEGATIVE  NEGATIVE   Ketones, ur NEGATIVE  NEGATIVE mg/dL   Protein, ur NEGATIVE  NEGATIVE mg/dL   Urobilinogen, UA 0.2  0.0 - 1.0 mg/dL   Nitrite NEGATIVE  NEGATIVE   Leukocytes, UA NEGATIVE  NEGATIVE  POCT I-STAT, CHEM 8      Component Value Range   Sodium 141  135 - 145 mEq/L   Potassium 4.0  3.5 - 5.1 mEq/L   Chloride 105  96 - 112 mEq/L   BUN 9  6 - 23 mg/dL   Creatinine, Ser 9.56  0.50 - 1.10 mg/dL   Glucose, Bld 94  70 - 99 mg/dL   Calcium, Ion 3.87 (*) 1.12 - 1.23 mmol/L   TCO2 24  0 - 100 mmol/L   Hemoglobin 14.3  12.0 - 15.0 g/dL   HCT 56.4  33.2 - 95.1 %   Ct Head Wo  Contrast  03/29/2012  *RADIOLOGY REPORT*  Clinical Data: Syncopal episode.  Left-sided neck and left upper extremity numbness  CT HEAD WITHOUT CONTRAST  Technique:  Contiguous axial images were obtained from the base of the skull through the vertex without contrast.  Comparison: Head CT 09/15/2011  Findings: No acute intracranial hemorrhage.  No focal mass lesion. No CT evidence of acute infarction.   No midline shift or mass effect.  No hydrocephalus.  Basilar cisterns are patent. Dystrophic calcification in the right basal ganglia is unchanged. There are periventricular and subcortical white matter hypodensities unchanged from prior.  Paranasal sinuses and mastoid air cells are clear.  Orbits are normal.  IMPRESSION:  1.  No acute intracranial findings. 2.  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  Original Report Authenticated By: Genevive Bi, M.D.       MDM  Pt with syncopal episode who complaining of dizziness.  No focal neuro deficits on exam.  No obvious trauma.  VSS, afebrile.  Normal gait.  Plan to check CBC, istat.  Head CT for recent fall, orthostatic VS and EKG.  NS IVF given, along with motrin for headache and meclizine for dizziness.  cspine cleared using NEXUS criteria.  Care discussed with my attending.    4:37 PM Normal orthostatic vital sign. No signs of anemia. Electrolytes are well within normal limits. Her UA is unremarkable. Her EKG is unremarkable. Head CT shows no acute changes.    4:50 PM Pt continues to endorse a headache, however her dizziness has improved.  Her VSS.   5:21 PM Pt stable to be discharge.  Recommend f/u with PCP for further care.    BP 143/93  Pulse 81  Temp 98.2 F (36.8 C) (Oral)  Resp 16  SpO2 99%  LMP 04/18/2010   Fayrene Helper, PA-C 03/29/12 1722

## 2012-03-29 NOTE — ED Notes (Signed)
Patient transported to CT 

## 2012-03-29 NOTE — ED Notes (Signed)
Taken off long spine board & C-collar removed per ED PA. Pt ambulatory in room, c/o lightheadedness upon standing. Denies posterior head pain  once taken off LSB. C/o numbness to left side neck continues.

## 2012-03-30 ENCOUNTER — Telehealth: Payer: Self-pay | Admitting: Family

## 2012-03-30 ENCOUNTER — Ambulatory Visit (HOSPITAL_BASED_OUTPATIENT_CLINIC_OR_DEPARTMENT_OTHER)
Admission: RE | Admit: 2012-03-30 | Discharge: 2012-03-30 | Disposition: A | Discharge status: Home / Self Care | Payer: Medicare Other | Source: Ambulatory Visit | Attending: Family | Admitting: Family

## 2012-03-30 ENCOUNTER — Telehealth: Payer: Self-pay | Admitting: *Deleted

## 2012-03-30 ENCOUNTER — Ambulatory Visit (INDEPENDENT_AMBULATORY_CARE_PROVIDER_SITE_OTHER): Payer: Medicare Other | Admitting: Family

## 2012-03-30 ENCOUNTER — Encounter: Payer: Self-pay | Admitting: Family

## 2012-03-30 VITALS — BP 130/90 | HR 90 | Temp 98.4°F | Resp 16 | Ht 65.0 in | Wt 178.1 lb

## 2012-03-30 DIAGNOSIS — E8940 Asymptomatic postprocedural ovarian failure: Secondary | ICD-10-CM

## 2012-03-30 DIAGNOSIS — J984 Other disorders of lung: Secondary | ICD-10-CM | POA: Insufficient documentation

## 2012-03-30 DIAGNOSIS — R55 Syncope and collapse: Secondary | ICD-10-CM

## 2012-03-30 DIAGNOSIS — E538 Deficiency of other specified B group vitamins: Secondary | ICD-10-CM

## 2012-03-30 MED ORDER — IOHEXOL 350 MG/ML SOLN
80.0000 mL | Freq: Once | INTRAVENOUS | Status: AC | PRN
  Administered 2012-03-30: 80 mL via INTRAVENOUS

## 2012-03-30 MED ORDER — CYANOCOBALAMIN 1000 MCG/ML IJ SOLN
1000.0000 ug | INTRAMUSCULAR | Status: DC
  Administered 2012-03-30: 1000 ug via INTRAMUSCULAR

## 2012-03-30 NOTE — Assessment & Plan Note (Signed)
Will obtain EEG, stress test, 2-d echo and a CTA chest to rule out PE.  Refer to cardiology.  If + EEG will refer to neuro.

## 2012-03-30 NOTE — Telephone Encounter (Signed)
Pt states she is currently without a GYN as he no longer accepts her insurance. Pt requests referral to new GYN and wants to know if we can refill her hormone medication until she establishes with new gyn? Looks like she was taking estrace 2mg  daily.  Please advise.

## 2012-03-30 NOTE — Assessment & Plan Note (Signed)
b12 injection today.  

## 2012-03-30 NOTE — Telephone Encounter (Signed)
Patient scheduled appointment for tomorrow, 03/31/12.

## 2012-03-30 NOTE — Patient Instructions (Addendum)
Please complete your CT angio on the first floor.  You will be contacted about your referral for EEG, exercise stress test and echocardiogram. Follow up in 1 month.  Go to ER if you develop recurrent fainting spells.

## 2012-03-30 NOTE — Progress Notes (Signed)
Subjective:    Patient ID: Lynn Gonzalez, female    DOB: 08/30/1960, 51 y.o.   MRN: [id number]  HPI  Ms.  Soloway is a 51 yr old female who presents today for an ED follow up following a syncopal episode yesterday.  Apparently she walking to the bus stop earlier today when she felt dizzy. Reports that she felt unsteady, grabbed the fence.  Remembers holding the fence.  Leaned back against the fence.  Tried to walk across the st.  Reports that she felt dazed.  Asked girl for some help.  She is not sure how long she lost consciousness.  She was brought to [facility] for evaluation. ED records are reviewed. She had an unremarkable CT head.  She denied bowel/bladder loss.  She reports that today she has been very sleepy.  She denies chest pain, shortness of breath, nausea or vomitting.    Pt had a similar episode back in 8/12.  This was followed by a holter monitor which showed sinus rhythm. She was referred to neurology, but I do not see that she went.  She thinks she had a stress test >1 yr ago.  This was an exercise stress.  She does not remember where this was performed.      Review of Systems See HPI  Past Medical History  Diagnosis Date  . Bipolar affective disorder 1985    ?nervous breakdown  . Thyroid disease     ? overactive thyroid  . Breast tumor   . GERD (gastroesophageal reflux disease)     History   Social History  . Marital Status: Single    Spouse Name: N/A    Number of Children: N/A  . Years of Education: N/A   Occupational History  . [employer]   Social History Main Topics  . Smoking status: Never Smoker   . Smokeless tobacco: Never Used  . Alcohol Use: No  . Drug Use: No  . Sexually Active: Not on file   Other Topics Concern  . Not on file   Social History Narrative   singleRegular exercise:  Yes    Past Surgical History  Procedure Date  . Tubal ligation 1985, 1996  . Gallbladder surgery 1985    stones removed  . Mediastinoscopy  2001  . Sternotomy 2001  . Bunionectomy     bilateral  . Abdominal hysterectomy 06/03/10  . Knee surgery 1987    bilateral    Family History  Problem Relation Age of Onset  . Diabetes Mother   . Hypertension Mother   . Cancer Father     ?type  . Hypertension Father     No Known Allergies  Current Outpatient Prescriptions on File Prior to Visit  Medication Sig Dispense Refill  . albuterol (PROVENTIL HFA;VENTOLIN HFA) 108 (90 BASE) MCG/ACT inhaler Inhale 2 puffs into the lungs every 6 (six) hours as needed. For shortness of breath.      . folic acid (FOLVITE) 1 MG tablet Take 1 mg by mouth daily.      Marland Kitchen ibuprofen (ADVIL,MOTRIN) 600 MG tablet Take 1 tablet (600 mg total) by mouth every 6 (six) hours as needed for pain.  20 tablet  0  . lithium 300 MG tablet Take 300-600 mg by mouth 2 (two) times daily. Take 1 tablet every morning and take 2 tablets every night at bedtime.      . meclizine (ANTIVERT) 50 MG tablet Take 0.5 tablets (  25 mg total) by mouth 3 (three) times daily as needed for dizziness or nausea.  10 tablet  0  . pantoprazole (PROTONIX) 40 MG tablet Take 1 tablet (40 mg total) by mouth daily.  30 tablet  5  . QUEtiapine (SEROQUEL) 200 MG tablet Take 300 mg by mouth at bedtime.       Current Facility-Administered Medications on File Prior to Visit  Medication Dose Route Frequency Provider Last Rate Last Dose  . DISCONTD: morphine 4 MG/ML injection 4 mg  4 mg Intravenous Once Fayrene Helper, PA-C        BP 130/90  Pulse 90  Temp 98.4 F (36.9 C) (Oral)  Resp 16  Ht 5\' 5"  (1.651 m)  Wt 178 lb 1.3 oz (80.777 kg)  BMI 29.63 kg/m2  SpO2 99%  LMP 04/18/2010       Objective:   Physical Exam  Constitutional: She appears well-developed and well-nourished. No distress.  Cardiovascular: Normal rate and regular rhythm.   No murmur heard. Pulmonary/Chest: Effort normal and breath sounds normal. No respiratory distress. She has no wheezes. She has no rales. She exhibits no  tenderness.  Musculoskeletal: She exhibits no edema.  Psychiatric: Her speech is normal and behavior is normal. Judgment and thought content normal. Cognition and memory are normal.       Flat affect.          Assessment & Plan:

## 2012-03-30 NOTE — ED Provider Notes (Signed)
Medical screening examination/treatment/procedure(s) were performed by non-physician practitioner and as supervising physician I was immediately available for consultation/collaboration.  Flint Melter, MD 03/30/12 Jacinta Shoe

## 2012-03-30 NOTE — Telephone Encounter (Signed)
Please call pt to arrange an ED follow up visit this week.

## 2012-03-31 ENCOUNTER — Ambulatory Visit: Payer: Medicare Other | Admitting: Family

## 2012-03-31 MED ORDER — ESTRADIOL 2 MG PO TABS: 2.0000 mg | ORAL_TABLET | Freq: Every day | ORAL | Status: DC

## 2012-03-31 NOTE — Telephone Encounter (Signed)
Attempted to reach pt and left detailed message on cell# for pt to let us know if she doesn't hear from [place] within 1 week re: gyn referral and rx should be ready for pick up at [facility].

## 2012-03-31 NOTE — Telephone Encounter (Signed)
Rx sent referral made.

## 2012-04-01 ENCOUNTER — Encounter (HOSPITAL_COMMUNITY): Payer: Self-pay | Admitting: *Deleted

## 2012-04-01 ENCOUNTER — Emergency Department (INDEPENDENT_AMBULATORY_CARE_PROVIDER_SITE_OTHER): Payer: Medicare Other

## 2012-04-01 ENCOUNTER — Emergency Department (INDEPENDENT_AMBULATORY_CARE_PROVIDER_SITE_OTHER)
Admission: EM | Admit: 2012-04-01 | Discharge: 2012-04-01 | Disposition: A | Discharge status: Home / Self Care | Payer: Medicare Other | Source: Home / Self Care | Attending: Emergency Medicine | Admitting: Emergency Medicine

## 2012-04-01 DIAGNOSIS — L089 Local infection of the skin and subcutaneous tissue, unspecified: Secondary | ICD-10-CM

## 2012-04-01 DIAGNOSIS — S61209A Unspecified open wound of unspecified finger without damage to nail, initial encounter: Secondary | ICD-10-CM

## 2012-04-01 DIAGNOSIS — IMO0002 Reserved for concepts with insufficient information to code with codable children: Secondary | ICD-10-CM

## 2012-04-01 DIAGNOSIS — Z4802 Encounter for removal of sutures: Secondary | ICD-10-CM

## 2012-04-01 MED ORDER — CEPHALEXIN 500 MG PO CAPS: 500.0000 mg | ORAL_CAPSULE | Freq: Four times a day (QID) | ORAL | Status: AC

## 2012-04-01 MED ORDER — BACITRACIN 500 UNIT/GM EX OINT
1.0000 "application " | TOPICAL_OINTMENT | Freq: Once | CUTANEOUS | Status: AC
  Administered 2012-04-01: 1 via TOPICAL

## 2012-04-01 NOTE — ED Notes (Signed)
Suture removal from finger

## 2012-04-01 NOTE — ED Provider Notes (Signed)
History     CSN: [id number]  Arrival date & time 04/01/12  1626   First MD Initiated Contact with Patient 04/01/12 1657      Chief Complaint  Patient presents with  . Suture / Staple Removal    (Consider location/radiation/quality/duration/timing/severity/associated sxs/prior treatment) HPI Comments: Patient originally sustained laceration of her finger on the edge of a pork and beans can. This was repaired in ED on 8/6. 4 sutures were placed. She returned the next day for bleeding from the wound. There were no signs of infection, 4 stitches were intact. Was placed in a finger splint by ortho tech. She is taking the ibuprofen as directed. She complains of persistent pain, swelling at the laceration site. No nausea, vomiting, fevers, erythema extending up her hand. No drainage from the laceration. No foreign body sensation, distal paresthesias. She states that she's been wearing the finger splint as directed. She is not a diabetic or smoker.  ROS as noted in HPI. All other ROS negative.   Patient is a 51 y.o. female presenting with suture removal. The history is provided by the patient. No language interpreter was used.  Suture / Staple Removal  The sutures were placed 7 to 10 days ago. Treatments since wound repair include a wound recheck. There has been no drainage from the wound. The redness has improved. The swelling has improved. The pain has not changed.    Past Medical History  Diagnosis Date  . Bipolar affective disorder 1985    ?nervous breakdown  . Thyroid disease     ? overactive thyroid  . Breast tumor   . GERD (gastroesophageal reflux disease)     Past Surgical History  Procedure Date  . Tubal ligation 1985, 1996  . Gallbladder surgery 1985    stones removed  . Mediastinoscopy 2001  . Sternotomy 2001  . Bunionectomy     bilateral  . Abdominal hysterectomy 06/03/10  . Knee surgery 1987    bilateral    Family History  Problem Relation Age of Onset  .  Diabetes Mother   . Hypertension Mother   . Cancer Father     ?type  . Hypertension Father     History  Substance Use Topics  . Smoking status: Never Smoker   . Smokeless tobacco: Never Used  . Alcohol Use: No    OB History    Grav Para Term Preterm Abortions TAB SAB Ect Mult Living                  Review of Systems  Constitutional: Negative for fever.  Skin: Positive for wound.  Neurological: Negative for numbness.    Allergies  Review of patient's allergies indicates no known allergies.  Home Medications   Current Outpatient Rx  Name Route Sig Dispense Refill  . ALBUTEROL SULFATE HFA 108 (90 BASE) MCG/ACT IN AERS Inhalation Inhale 2 puffs into the lungs every 6 (six) hours as needed. For shortness of breath.    . ESTRADIOL 2 MG PO TABS Oral Take 1 tablet (2 mg total) by mouth daily. 30 tablet 2  . FOLIC ACID 1 MG PO TABS Oral Take 1 mg by mouth daily.    . IBUPROFEN 600 MG PO TABS Oral Take 1 tablet (600 mg total) by mouth every 6 (six) hours as needed for pain. 20 tablet 0  . LITHIUM CARBONATE 300 MG PO TABS Oral Take 300-600 mg by mouth 2 (two) times daily. Take 1 tablet every morning and take 2  tablets every night at bedtime.    Marland Kitchen MECLIZINE HCL 50 MG PO TABS Oral Take 0.5 tablets (25 mg total) by mouth 3 (three) times daily as needed for dizziness or nausea. 10 tablet 0  . QUETIAPINE FUMARATE 200 MG PO TABS Oral Take 300 mg by mouth at bedtime.    . CEPHALEXIN 500 MG PO CAPS Oral Take 1 capsule (500 mg total) by mouth 4 (four) times daily. X 10 days 40 capsule 0  . PANTOPRAZOLE SODIUM 40 MG PO TBEC Oral Take 1 tablet (40 mg total) by mouth daily. 30 tablet 5    BP 140/95  Pulse 86  Temp 97.5 F (36.4 C) (Oral)  Resp 18  SpO2 98%  LMP 04/18/2010  Physical Exam  Nursing note and vitals reviewed. Constitutional: She is oriented to person, place, and time. She appears well-developed and well-nourished. No distress.  HENT:  Head: Normocephalic and atraumatic.    Eyes: Conjunctivae and EOM are normal.  Neck: Normal range of motion.  Cardiovascular: Normal rate.   Pulmonary/Chest: Effort normal.  Abdominal: She exhibits no distension.  Musculoskeletal: Normal range of motion.       Hands:      Healing 2 cm laceration on the distal aspect of proximal phalanx of right index finger. Tenderness, mild swelling at site. No expressible purulent drainage. No pain with passive extension. No tenderness along the flexor sheath, No tenderness at MCP, PIP joint. Refill less than 2 seconds. 2 discrimination intact. See picture at end of chart  Neurological: She is alert and oriented to person, place, and time. Coordination normal.  Skin: Skin is warm and dry.  Psychiatric: She has a normal mood and affect. Her behavior is normal. Judgment and thought content normal.    ED Course  SUTURE REMOVAL Date/Time: 04/01/2012 6:33 PM Performed by: Luiz Blare Authorized by: Luiz Blare Consent: Verbal consent obtained. Risks and benefits: risks, benefits and alternatives were discussed Consent given by: patient Patient understanding: patient states understanding of the procedure being performed Patient consent: the patient's understanding of the procedure matches consent given Imaging studies: imaging studies available Required items: required blood products, implants, devices, and special equipment available Patient identity confirmed: verbally with patient Time out: Immediately prior to procedure a "time out" was called to verify the correct patient, procedure, equipment, support staff and site/side marked as required. Body area: upper extremity Location details: right index finger Wound Appearance: tender and clean Sutures Removed: 4 Post-removal: dressing applied and antibiotic ointment applied Patient tolerance: Patient tolerated the procedure well with no immediate complications.   (including critical care time)  Labs Reviewed - No data to  display Dg Finger Index Right  04/01/2012  *RADIOLOGY REPORT*  Clinical Data: Laceration  RIGHT INDEX FINGER 2+V  Comparison: None.  Findings: No radiodense foreign body or subcutaneous gas. Negative for fracture, dislocation, or other acute abnormality.  Normal alignment and mineralization. No significant degenerative change. Regional soft tissues unremarkable.  IMPRESSION:  Negative  Original Report Authenticated By: Osa Craver, M.D.     1. Dressing change/suture removal   2. Infected finger laceration     MDM  Previous records reviewed. As noted in history of present illness.  Will check x-ray to rule out retained foreign body. Otherwise, appears to be mildly infected. No evidence of flexor tenosynovitis or subcutaneous abscess. She is not a smoker or a diabetic. Cleansed wound with chlorhexidine and water, applied sterile pressure dressing and bacitracin, re-immobilized finger. Will start  her on some Keflex. Bringing her back in 2 days for wound check. Discussed signs and symptoms that should prompt an earlier return. Patient agrees with plan.    Luiz Blare, MD 04/02/12 [phone]

## 2012-04-03 ENCOUNTER — Emergency Department (INDEPENDENT_AMBULATORY_CARE_PROVIDER_SITE_OTHER)
Admission: EM | Admit: 2012-04-03 | Discharge: 2012-04-03 | Disposition: A | Discharge status: Home / Self Care | Payer: Medicare Other | Source: Home / Self Care | Attending: Emergency Medicine | Admitting: Emergency Medicine

## 2012-04-03 ENCOUNTER — Encounter (HOSPITAL_COMMUNITY): Payer: Self-pay

## 2012-04-03 DIAGNOSIS — IMO0001 Reserved for inherently not codable concepts without codable children: Secondary | ICD-10-CM

## 2012-04-03 DIAGNOSIS — S61209A Unspecified open wound of unspecified finger without damage to nail, initial encounter: Secondary | ICD-10-CM

## 2012-04-03 NOTE — ED Notes (Addendum)
Pt here for recheck of rt index finger, swelling has decreased and finger less painful.

## 2012-04-03 NOTE — ED Provider Notes (Signed)
Medical screening examination/treatment/procedure(s) were performed by non-physician practitioner and as supervising physician I was immediately available for consultation/collaboration.  Leslee Home, M.D.   Reuben Likes, MD 04/03/12 [phone]

## 2012-04-03 NOTE — ED Provider Notes (Signed)
History     CSN: [id number]  Arrival date & time 04/03/12  1707   First MD Initiated Contact with Patient 04/03/12 1836      Chief Complaint  Patient presents with  . Follow-up    (Consider location/radiation/quality/duration/timing/severity/associated sxs/prior treatment) HPI Comments: Pt here for recheck of finger laceration.  Previously when sutures were removed, wound was found to be infected.  Pt is continuing on antibiotics.  Pt reports finger is a little sore, but much improved from what it was.   Patient is a 51 y.o. female presenting with wound check. The history is provided by the patient.  Wound Check  She was treated in the ED 10 to 14 days ago. Previous treatment in the ED includes laceration repair. Treatments since wound repair include oral antibiotics. There has been no drainage from the wound. The redness has improved. The swelling has improved. The pain has improved. She has no difficulty moving the affected extremity or digit.    Past Medical History  Diagnosis Date  . Bipolar affective disorder 1985    ?nervous breakdown  . Thyroid disease     ? overactive thyroid  . Breast tumor   . GERD (gastroesophageal reflux disease)     Past Surgical History  Procedure Date  . Tubal ligation 1985, 1996  . Gallbladder surgery 1985    stones removed  . Mediastinoscopy 2001  . Sternotomy 2001  . Bunionectomy     bilateral  . Abdominal hysterectomy 06/03/10  . Knee surgery 1987    bilateral    Family History  Problem Relation Age of Onset  . Diabetes Mother   . Hypertension Mother   . Cancer Father     ?type  . Hypertension Father     History  Substance Use Topics  . Smoking status: Never Smoker   . Smokeless tobacco: Never Used  . Alcohol Use: No    OB History    Grav Para Term Preterm Abortions TAB SAB Ect Mult Living                  Review of Systems  Constitutional: Negative for fever and chills.  Skin: Positive for wound. Negative for  color change.    Allergies  Review of patient's allergies indicates no known allergies.  Home Medications   Current Outpatient Rx  Name Route Sig Dispense Refill  . ALBUTEROL SULFATE HFA 108 (90 BASE) MCG/ACT IN AERS Inhalation Inhale 2 puffs into the lungs every 6 (six) hours as needed. For shortness of breath.    . CEPHALEXIN 500 MG PO CAPS Oral Take 1 capsule (500 mg total) by mouth 4 (four) times daily. X 10 days 40 capsule 0  . ESTRADIOL 2 MG PO TABS Oral Take 1 tablet (2 mg total) by mouth daily. 30 tablet 2  . FOLIC ACID 1 MG PO TABS Oral Take 1 mg by mouth daily.    . IBUPROFEN 600 MG PO TABS Oral Take 1 tablet (600 mg total) by mouth every 6 (six) hours as needed for pain. 20 tablet 0  . LITHIUM CARBONATE 300 MG PO TABS Oral Take 300-600 mg by mouth 2 (two) times daily. Take 1 tablet every morning and take 2 tablets every night at bedtime.    Marland Kitchen MECLIZINE HCL 50 MG PO TABS Oral Take 0.5 tablets (25 mg total) by mouth 3 (three) times daily as needed for dizziness or nausea. 10 tablet 0  . PANTOPRAZOLE SODIUM 40 MG PO TBEC Oral  Take 1 tablet (40 mg total) by mouth daily. 30 tablet 5  . QUETIAPINE FUMARATE 200 MG PO TABS Oral Take 300 mg by mouth at bedtime.      BP 144/100  Pulse 78  Temp 97.2 F (36.2 C) (Oral)  Resp 18  SpO2 98%  LMP 04/18/2010  Physical Exam  Constitutional: She appears well-developed and well-nourished. No distress.  Musculoskeletal:       Right hand: She exhibits decreased range of motion and laceration.       Reduced ROM R index finger due to healing laceration.  Strength intact  Skin: Skin is warm and dry.       Well healed wound R index finger laceration.  No warmth or erythema, minimal tenderness to palp.      ED Course  Procedures (including critical care time)  Labs Reviewed - No data to display    1. Healing laceration       MDM  Discussed with pt importance of gently using finger/ROM to regain regular ROM as wound heals.  No  evidence infection today.  Pt will continue course of keflex until finished.          Cathlyn Parsons, NP 04/03/12 [phone]

## 2012-04-05 ENCOUNTER — Ambulatory Visit (HOSPITAL_COMMUNITY): Payer: Medicare Other

## 2012-04-05 ENCOUNTER — Other Ambulatory Visit (HOSPITAL_COMMUNITY): Payer: Medicare Other

## 2012-04-06 ENCOUNTER — Encounter: Payer: Self-pay | Admitting: *Deleted

## 2012-04-07 ENCOUNTER — Ambulatory Visit (INDEPENDENT_AMBULATORY_CARE_PROVIDER_SITE_OTHER): Payer: Medicare Other | Admitting: Family

## 2012-04-07 ENCOUNTER — Encounter: Payer: Self-pay | Admitting: Cardiology

## 2012-04-07 ENCOUNTER — Ambulatory Visit (INDEPENDENT_AMBULATORY_CARE_PROVIDER_SITE_OTHER): Payer: Medicare Other | Admitting: Cardiology

## 2012-04-07 ENCOUNTER — Encounter: Payer: Self-pay | Admitting: Family

## 2012-04-07 VITALS — BP 122/86 | HR 70 | Temp 98.0°F | Resp 14 | Wt 178.2 lb

## 2012-04-07 VITALS — BP 150/90 | HR 80 | Ht 66.0 in | Wt 178.0 lb

## 2012-04-07 DIAGNOSIS — F319 Bipolar disorder, unspecified: Secondary | ICD-10-CM

## 2012-04-07 DIAGNOSIS — R55 Syncope and collapse: Secondary | ICD-10-CM

## 2012-04-07 DIAGNOSIS — R0683 Snoring: Secondary | ICD-10-CM | POA: Insufficient documentation

## 2012-04-07 DIAGNOSIS — G44209 Tension-type headache, unspecified, not intractable: Secondary | ICD-10-CM

## 2012-04-07 DIAGNOSIS — R51 Headache: Secondary | ICD-10-CM

## 2012-04-07 DIAGNOSIS — R0609 Other forms of dyspnea: Secondary | ICD-10-CM

## 2012-04-07 MED ORDER — KETOROLAC TROMETHAMINE 60 MG/2ML IM SOLN
60.0000 mg | Freq: Once | INTRAMUSCULAR | Status: AC
  Administered 2012-04-07: 60 mg via INTRAMUSCULAR

## 2012-04-07 MED ORDER — KETOROLAC TROMETHAMINE 60 MG/2ML IM SOLN: 60.0000 mg | Freq: Once | INTRAMUSCULAR | Status: DC

## 2012-04-07 NOTE — Assessment & Plan Note (Signed)
Management per primary care. 

## 2012-04-07 NOTE — Assessment & Plan Note (Signed)
IM toradol given today in the office.  She is in keflex (due to recent finger laceration) which would help with any associated sinusitis (though clinically I doubt this).

## 2012-04-07 NOTE — Patient Instructions (Addendum)
Please call if your headache worsens or if no improvement in 2-3 days.

## 2012-04-07 NOTE — Progress Notes (Signed)
Subjective:    Patient ID: Lynn Gonzalez, female    DOB: 1961-04-26, 51 y.o.   MRN: [id number]  HPI  Lynn Gonzalez is a 51 yr old female who presents today with chief complaint of headache. Reports HA since Friday.  Frontal headache.  No sinus drainage.  Tried nothing for the headache.  Denies nausea, or vomitting.  Reports HA is 10/10.  Reports as aching.  Woke her up at 2AM.  Worse while sleeping.   Snoring- Reports that boyfriend told her that her snoring is "so bad."  Reports + daytime somnolence.   Syncope- missed her EEG on Monday.  Met with cardiology today.  Cardiology is scheduling an event monitor and an echo in addition to her scheduled treadmill.   Review of Systems    see HPI  Past Medical History  Diagnosis Date  . Bipolar affective disorder 1985    ?nervous breakdown  . Thyroid disease     ? overactive thyroid  . Breast tumor   . B12 DEFICIENCY   . ANEMIA   . INTERSTITIAL LUNG DISEASE   . GASTROESOPHAGEAL REFLUX DISEASE   . BREAST CYST, LEFT   . GOITER   . Syncope     History   Social History  . Marital Status: Single    Spouse Name: N/A    Number of Children: 2  . Years of Education: N/A   Occupational History  .      Disability   Social History Main Topics  . Smoking status: Never Smoker   . Smokeless tobacco: Never Used  . Alcohol Use: No  . Drug Use: No  . Sexually Active: Yes    Birth Control/ Protection: None   Other Topics Concern  . Not on file   Social History Narrative   singleRegular exercise:  Yes    Past Surgical History  Procedure Date  . Tubal ligation 1985, 1996  . Gallbladder surgery 1985    stones removed  . Mediastinoscopy 2001  . Sternotomy 2001  . Bunionectomy     bilateral  . Abdominal hysterectomy 06/03/10  . Knee surgery 1987    bilateral  . Tonsillectomy     Family History  Problem Relation Age of Onset  . Diabetes Mother   . Hypertension Mother   . Cancer Father     ?type  . Hypertension Father      No Known Allergies  Current Outpatient Prescriptions on File Prior to Visit  Medication Sig Dispense Refill  . albuterol (PROVENTIL HFA;VENTOLIN HFA) 108 (90 BASE) MCG/ACT inhaler Inhale 2 puffs into the lungs every 6 (six) hours as needed. For shortness of breath.      . cephALEXin (KEFLEX) 500 MG capsule Take 1 capsule (500 mg total) by mouth 4 (four) times daily. X 10 days  40 capsule  0  . estradiol (ESTRACE) 2 MG tablet Take 1 tablet (2 mg total) by mouth daily.  30 tablet  2  . folic acid (FOLVITE) 1 MG tablet Take 1 mg by mouth daily.      Marland Kitchen ibuprofen (ADVIL,MOTRIN) 600 MG tablet Take 1 tablet (600 mg total) by mouth every 6 (six) hours as needed for pain.  20 tablet  0  . lithium 300 MG tablet Take 1 tablet every morning and take 2 tablets every night at bedtime.      . meclizine (ANTIVERT) 50 MG tablet Take 0.5 tablets (25 mg total) by mouth 3 (three) times daily as needed for dizziness  or nausea.  10 tablet  0  . pantoprazole (PROTONIX) 40 MG tablet Take 1 tablet (40 mg total) by mouth daily.  30 tablet  5  . QUEtiapine (SEROQUEL) 200 MG tablet Take 300 mg by mouth at bedtime.       Current Facility-Administered Medications on File Prior to Visit  Medication Dose Route Frequency Provider Last Rate Last Dose  . cyanocobalamin ((VITAMIN B-12)) injection 1,000 mcg  1,000 mcg Intramuscular Q30 days Sandford Craze, NP   1,000 mcg at 03/30/12 1626    BP 122/86  Pulse 70  Temp 98 F (36.7 C) (Oral)  Resp 14  Wt 178 lb 4 oz (80.854 kg)  SpO2 98%  LMP 04/18/2010    Objective:   Physical Exam  Constitutional: She is oriented to person, place, and time. She appears well-developed and well-nourished. No distress.  HENT:  Head: Normocephalic and atraumatic.  Eyes: Pupils are equal, round, and reactive to light.  Cardiovascular: Normal rate and regular rhythm.   No murmur heard. Pulmonary/Chest: Effort normal and breath sounds normal.  Lymphadenopathy:    She has no  cervical adenopathy.  Neurological: She is alert and oriented to person, place, and time. She exhibits normal muscle tone.  Psychiatric: She has a normal mood and affect. Her behavior is normal. Judgment and thought content normal.          Assessment & Plan:

## 2012-04-07 NOTE — Progress Notes (Signed)
HPI: 51 yo female with no prior cardiac history for evaluation of syncope. Event monitor in Sept 2012 showed sinus to sinus tachycardia. CTA in August of 2013 showed no pulmonary embolus. Laboratories in August of 2013 showed a potassium of 4.0 and normal renal function. Hemoglobin 12.3. Patient typically does not have dyspnea on exertion, orthopnea, PND, pedal edema, palpitations or exertional chest pain. 9 days ago while walking down the street she developed sudden onset of visual disturbance and dizziness. There was no associated chest pain, dyspnea, nausea or palpitations. She felt weak and leaned against a fence for approximately 5 minutes. She then went into [employer] with persistent symptoms and while standing had frank syncope by her report. She was unconscious for approximately 5 minutes. When she woke she continued to feel dizzy. Because of the above we were asked to evaluate. She does not recall any previous episodes of syncope.  Current Outpatient Prescriptions  Medication Sig Dispense Refill  . albuterol (PROVENTIL HFA;VENTOLIN HFA) 108 (90 BASE) MCG/ACT inhaler Inhale 2 puffs into the lungs every 6 (six) hours as needed. For shortness of breath.      . cephALEXin (KEFLEX) 500 MG capsule Take 1 capsule (500 mg total) by mouth 4 (four) times daily. X 10 days  40 capsule  0  . estradiol (ESTRACE) 2 MG tablet Take 1 tablet (2 mg total) by mouth daily.  30 tablet  2  . folic acid (FOLVITE) 1 MG tablet Take 1 mg by mouth daily.      Marland Kitchen ibuprofen (ADVIL,MOTRIN) 600 MG tablet Take 1 tablet (600 mg total) by mouth every 6 (six) hours as needed for pain.  20 tablet  0  . lithium 300 MG tablet Take 1 tablet every morning and take 2 tablets every night at bedtime.      . meclizine (ANTIVERT) 50 MG tablet Take 0.5 tablets (25 mg total) by mouth 3 (three) times daily as needed for dizziness or nausea.  10 tablet  0  . pantoprazole (PROTONIX) 40 MG tablet Take 1 tablet (40 mg total) by mouth daily.   30 tablet  5  . QUEtiapine (SEROQUEL) 200 MG tablet Take 300 mg by mouth at bedtime.       Current Facility-Administered Medications  Medication Dose Route Frequency Provider Last Rate Last Dose  . cyanocobalamin ((VITAMIN B-12)) injection 1,000 mcg  1,000 mcg Intramuscular Q30 days Sandford Craze, NP   1,000 mcg at 03/30/12 1626    No Known Allergies  Past Medical History  Diagnosis Date  . Bipolar affective disorder 1985    ?nervous breakdown  . Thyroid disease     ? overactive thyroid  . Breast tumor   . B12 DEFICIENCY   . ANEMIA   . INTERSTITIAL LUNG DISEASE   . GASTROESOPHAGEAL REFLUX DISEASE   . BREAST CYST, LEFT   . GOITER   . Syncope     Past Surgical History  Procedure Date  . Tubal ligation 1985, 1996  . Gallbladder surgery 1985    stones removed  . Mediastinoscopy 2001  . Sternotomy 2001  . Bunionectomy     bilateral  . Abdominal hysterectomy 06/03/10  . Knee surgery 1987    bilateral  . Tonsillectomy     History   Social History  . Marital Status: Single    Spouse Name: N/A    Number of Children: 2  . Years of Education: N/A   Occupational History  .      Disability  Social History Main Topics  . Smoking status: Never Smoker   . Smokeless tobacco: Never Used  . Alcohol Use: No  . Drug Use: No  . Sexually Active: Yes    Birth Control/ Protection: None   Other Topics Concern  . Not on file   Social History Narrative   singleRegular exercise:  Yes    Family History  Problem Relation Age of Onset  . Diabetes Mother   . Hypertension Mother   . Cancer Father     ?type  . Hypertension Father     ROS: headaches but no fevers or chills, productive cough, hemoptysis, dysphasia, odynophagia, melena, hematochezia, dysuria, hematuria, rash, seizure activity, orthopnea, PND, pedal edema, claudication. Remaining systems are negative.  Physical Exam:  Blood pressure 150/90, pulse 80, height 5\' 6"  (1.676 m), weight 178 lb (80.74 kg),  last menstrual period 04/18/2010.  General:  Well developed/well nourished in NAD Skin warm/dry, tattoos Patient not depressed No peripheral clubbing Back-normal HEENT-normal/normal eyelids Neck supple/normal carotid upstroke bilaterally; no bruits; no JVD; no thyromegaly chest - CTA/ normal expansion CV - RRR/normal S1 and S2; no murmurs, rubs or gallops;  PMI nondisplaced Abdomen -NT/ND, no HSM, no mass, + bowel sounds, no bruit 2+ femoral pulses, no bruits Ext-no edema, chords, 2+ DP Neuro-grossly nonfocal  ECG 8/12.13-sinus rhythm, first degree AV block, nonspecific ST changes

## 2012-04-07 NOTE — Assessment & Plan Note (Signed)
Etiology unclear. Description seems unlikely to be related to cardiac syncope. Plan CardioNet to exclude arrhythmia. Echocardiogram to assess LV function. She is also scheduled for an exercise treadmill.  If above negative she may need a neurological evaluation.

## 2012-04-07 NOTE — Assessment & Plan Note (Signed)
Request home sleep study through home health to rule out osa.

## 2012-04-07 NOTE — Patient Instructions (Addendum)
Your physician recommends that you schedule a follow-up appointment in: 6 WEEKS WITH DR CRENSHAW IN [place]  Your physician has requested that you have an echocardiogram. Echocardiography is a painless test that uses sound waves to create images of your heart. It provides your doctor with information about the size and shape of your heart and how well your heart's chambers and valves are working. This procedure takes approximately one hour. There are no restrictions for this procedure.   Your physician has recommended that you wear an event monitor. Event monitors are medical devices that record the heart's electrical activity. Doctors most often [place] these monitors to diagnose arrhythmias. Arrhythmias are problems with the speed or rhythm of the heartbeat. The monitor is a small, portable device. You can wear one while you do your normal daily activities. This is usually used to diagnose what is causing palpitations/syncope (passing out).

## 2012-04-08 ENCOUNTER — Telehealth: Payer: Self-pay | Admitting: Family

## 2012-04-08 MED ORDER — SUMATRIPTAN SUCCINATE 50 MG PO TABS: ORAL_TABLET | ORAL | Status: DC

## 2012-04-08 NOTE — Telephone Encounter (Signed)
rx sent for imitrex. Let me know if no improvement with imitrex.

## 2012-04-08 NOTE — Telephone Encounter (Signed)
ROV and/or Rx; please advise/SLS

## 2012-04-08 NOTE — Telephone Encounter (Signed)
Patient states that her headaches arent as bad since she received the shot, but she is still having headaches.   Especially when she first lay down. Please assist.

## 2012-04-09 NOTE — Telephone Encounter (Signed)
Notified pt with md response. Rx already sent to pharmacy... 04/09/12@9 :34am/LMB

## 2012-04-13 ENCOUNTER — Other Ambulatory Visit (HOSPITAL_COMMUNITY): Payer: Medicare Other

## 2012-04-15 ENCOUNTER — Other Ambulatory Visit (HOSPITAL_COMMUNITY): Payer: Medicare Other

## 2012-04-20 ENCOUNTER — Ambulatory Visit (HOSPITAL_COMMUNITY): Payer: Medicare Other

## 2012-04-23 ENCOUNTER — Ambulatory Visit: Payer: Medicare Other | Admitting: Family

## 2012-04-23 DIAGNOSIS — Z0289 Encounter for other administrative examinations: Secondary | ICD-10-CM

## 2012-04-26 ENCOUNTER — Encounter: Payer: Self-pay | Admitting: Family

## 2012-04-26 ENCOUNTER — Ambulatory Visit (INDEPENDENT_AMBULATORY_CARE_PROVIDER_SITE_OTHER): Payer: Medicare Other | Admitting: Family

## 2012-04-26 VITALS — BP 124/88 | HR 87 | Temp 98.6°F | Resp 16 | Ht 65.0 in | Wt 175.0 lb

## 2012-04-26 DIAGNOSIS — E538 Deficiency of other specified B group vitamins: Secondary | ICD-10-CM

## 2012-04-26 DIAGNOSIS — M7989 Other specified soft tissue disorders: Secondary | ICD-10-CM

## 2012-04-26 DIAGNOSIS — M109 Gout, unspecified: Secondary | ICD-10-CM

## 2012-04-26 DIAGNOSIS — Z23 Encounter for immunization: Secondary | ICD-10-CM

## 2012-04-26 MED ORDER — COLCHICINE 0.6 MG PO TABS: ORAL_TABLET | ORAL | Status: DC

## 2012-04-26 MED ORDER — CYANOCOBALAMIN 1000 MCG/ML IJ SOLN
1000.0000 ug | INTRAMUSCULAR | Status: DC
  Administered 2012-04-26: 1000 ug via INTRAMUSCULAR

## 2012-04-26 NOTE — Progress Notes (Signed)
Subjective:    Patient ID: Orlene Och Arceneaux, female    DOB: 06-11-1961, 51 y.o.   MRN: [id number]  HPI  Ms.  Buitron is a 51 yr old female who presents today to discuss pain in the left 5th toe.  Left toe- Pain/swelling was worse before the weekend but has improved.      Right ring finger- pt reports swelling overlying the area of laceration a few weeks ago.  Denies associated pain or redness.     Review of Systems See HPI  Past Medical History  Diagnosis Date  . Bipolar affective disorder 1985    ?nervous breakdown  . Thyroid disease     ? overactive thyroid  . Breast tumor   . B12 DEFICIENCY   . ANEMIA   . INTERSTITIAL LUNG DISEASE   . GASTROESOPHAGEAL REFLUX DISEASE   . BREAST CYST, LEFT   . GOITER   . Syncope     History   Social History  . Marital Status: Single    Spouse Name: N/A    Number of Children: 2  . Years of Education: N/A   Occupational History  .      Disability   Social History Main Topics  . Smoking status: Never Smoker   . Smokeless tobacco: Never Used  . Alcohol Use: No  . Drug Use: No  . Sexually Active: Yes    Birth Control/ Protection: None   Other Topics Concern  . Not on file   Social History Narrative   singleRegular exercise:  Yes    Past Surgical History  Procedure Date  . Tubal ligation 1985, 1996  . Gallbladder surgery 1985    stones removed  . Mediastinoscopy 2001  . Sternotomy 2001  . Bunionectomy     bilateral  . Abdominal hysterectomy 06/03/10  . Knee surgery 1987    bilateral  . Tonsillectomy     Family History  Problem Relation Age of Onset  . Diabetes Mother   . Hypertension Mother   . Cancer Father     ?type  . Hypertension Father     No Known Allergies  Current Outpatient Prescriptions on File Prior to Visit  Medication Sig Dispense Refill  . albuterol (PROVENTIL HFA;VENTOLIN HFA) 108 (90 BASE) MCG/ACT inhaler Inhale 2 puffs into the lungs every 6 (six) hours as needed. For shortness of  breath.      . estradiol (ESTRACE) 2 MG tablet Take 1 tablet (2 mg total) by mouth daily.  30 tablet  2  . folic acid (FOLVITE) 1 MG tablet Take 1 mg by mouth daily.      Marland Kitchen lithium 300 MG tablet Take 1 tablet every morning and take 2 tablets every night at bedtime.      . pantoprazole (PROTONIX) 40 MG tablet Take 1 tablet (40 mg total) by mouth daily.  30 tablet  5  . QUEtiapine (SEROQUEL) 200 MG tablet Take 300 mg by mouth at bedtime.      . SUMAtriptan (IMITREX) 50 MG tablet One tab by mouth at start of headache, may repeat in 2 hours once as needed.  10 tablet  0  . colchicine 0.6 MG tablet 2 tabs at start of gout flare followed by 1 tablet an hour later.  6 tablet  1   No current facility-administered medications on file prior to visit.    BP 124/88  Pulse 87  Temp 98.6 F (37 C) (Oral)  Resp 16  Ht 5\' 5"  (1.651  m)  Wt 175 lb (79.379 kg)  BMI 29.12 kg/m2  SpO2 98%  LMP 04/18/2010       Objective:   Physical Exam  Constitutional: She is oriented to person, place, and time. She appears well-developed and well-nourished. No distress.  Cardiovascular: Normal rate and regular rhythm.   No murmur heard. Pulmonary/Chest: Effort normal and breath sounds normal. No respiratory distress. She has no wheezes. She has no rales. She exhibits no tenderness.  Musculoskeletal:       Some swelling of the left 5th toe.  Right ring finger- some scarring at area of laceration (healed)  Some difficulty completed flexing the PIP joint on that finger.  Neurological: She is alert and oriented to person, place, and time.  Skin: Skin is warm.  Psychiatric: She has a normal mood and affect. Her behavior is normal. Judgment and thought content normal.          Assessment & Plan:

## 2012-04-26 NOTE — Patient Instructions (Addendum)
You will be contact about your referral for home health.  Please let us know if you have not heard back within 1 week about your referral. Please schedule a follow up appointment in 3 months.

## 2012-04-27 ENCOUNTER — Telehealth: Payer: Self-pay | Admitting: Family

## 2012-04-27 NOTE — Telephone Encounter (Signed)
Message copied by Sandford Craze on Tue Apr 27, 2012  9:02 AM ------      Message from: Eulah Pont      Created: Tue Apr 27, 2012  8:49 AM          Called [facility]  Spoke with Pam,she  received referral and have sent to the sleep company for study            ----- Message -----         From: Sandford Craze, NP         Sent: 04/26/2012   4:19 PM           To: Keith Rake Aheron            Pt tells me she has not been contacted re: sleep study, could you please check on this? thanks

## 2012-04-28 ENCOUNTER — Encounter: Payer: Medicare Other | Admitting: Physician Assistant

## 2012-04-28 DIAGNOSIS — M7989 Other specified soft tissue disorders: Secondary | ICD-10-CM | POA: Insufficient documentation

## 2012-04-28 NOTE — Assessment & Plan Note (Signed)
I suspect pain /swelling in the left foot is due to gout.  Rx colcrys.

## 2012-04-28 NOTE — Assessment & Plan Note (Signed)
b12 injection today.  Plan to check level next visit.

## 2012-04-28 NOTE — Assessment & Plan Note (Signed)
No sign of infection, but she does have some scar tissue and swelling at the healed laceration site.  I discussed possibilty of referring her to hand surgeon and possibly PT to help with the mobility of that finger.  She declines at this time.

## 2012-04-29 ENCOUNTER — Telehealth: Payer: Self-pay | Admitting: *Deleted

## 2012-04-29 NOTE — Telephone Encounter (Signed)
Per verbal from Provider, ok not to give flu vaccine as pt thinks she may have a latex allergy. Previous blister type reaction and rash to band aid. Left detailed message on pt's cell# ok to hold off on flu vaccine and to call if any questions.

## 2012-04-30 ENCOUNTER — Ambulatory Visit: Payer: Medicare Other

## 2012-05-19 ENCOUNTER — Encounter: Payer: Medicare Other | Admitting: Cardiology

## 2012-05-19 NOTE — Progress Notes (Signed)
   HPI: 51 yo female with no prior cardiac history I initially saw in August of 2013 for evaluation of syncope. Event monitor in Sept 2012 showed sinus to sinus tachycardia. CTA in August of 2013 showed no pulmonary embolus. Laboratories in August of 2013 showed a potassium of 4.0 and normal renal function. Hemoglobin 12.3.    Current Outpatient Prescriptions  Medication Sig Dispense Refill  . albuterol (PROVENTIL HFA;VENTOLIN HFA) 108 (90 BASE) MCG/ACT inhaler Inhale 2 puffs into the lungs every 6 (six) hours as needed. For shortness of breath.      . colchicine 0.6 MG tablet 2 tabs at start of gout flare followed by 1 tablet an hour later.  6 tablet  1  . estradiol (ESTRACE) 2 MG tablet Take 1 tablet (2 mg total) by mouth daily.  30 tablet  2  . folic acid (FOLVITE) 1 MG tablet Take 1 mg by mouth daily.      Marland Kitchen lithium 300 MG tablet Take 1 tablet every morning and take 2 tablets every night at bedtime.      . pantoprazole (PROTONIX) 40 MG tablet Take 1 tablet (40 mg total) by mouth daily.  30 tablet  5  . QUEtiapine (SEROQUEL) 200 MG tablet Take 300 mg by mouth at bedtime.      . SUMAtriptan (IMITREX) 50 MG tablet One tab by mouth at start of headache, may repeat in 2 hours once as needed.  10 tablet  0     Past Medical History  Diagnosis Date  . Bipolar affective disorder 1985    ?nervous breakdown  . Thyroid disease     ? overactive thyroid  . Breast tumor   . B12 DEFICIENCY   . ANEMIA   . INTERSTITIAL LUNG DISEASE   . GASTROESOPHAGEAL REFLUX DISEASE   . BREAST CYST, LEFT   . GOITER   . Syncope     Past Surgical History  Procedure Date  . Tubal ligation 1985, 1996  . Gallbladder surgery 1985    stones removed  . Mediastinoscopy 2001  . Sternotomy 2001  . Bunionectomy     bilateral  . Abdominal hysterectomy 06/03/10  . Knee surgery 1987    bilateral  . Tonsillectomy     History   Social History  . Marital Status: Single    Spouse Name: N/A    Number of Children:  2  . Years of Education: N/A   Occupational History  .      Disability   Social History Main Topics  . Smoking status: Never Smoker   . Smokeless tobacco: Never Used  . Alcohol Use: No  . Drug Use: No  . Sexually Active: Yes    Birth Control/ Protection: None   Other Topics Concern  . Not on file   Social History Narrative   singleRegular exercise:  Yes    ROS: no fevers or chills, productive cough, hemoptysis, dysphasia, odynophagia, melena, hematochezia, dysuria, hematuria, rash, seizure activity, orthopnea, PND, pedal edema, claudication. Remaining systems are negative.  Physical Exam: Well-developed well-nourished in no acute distress.  Skin is warm and dry.  HEENT is normal.  Neck is supple. No thyromegaly.  Chest is clear to auscultation with normal expansion.  Cardiovascular exam is regular rate and rhythm.  Abdominal exam nontender or distended. No masses palpated. Extremities show no edema. neuro grossly intact  ECG     This encounter was created in error - please disregard.

## 2012-06-28 ENCOUNTER — Telehealth: Payer: Self-pay | Admitting: *Deleted

## 2012-06-28 NOTE — Telephone Encounter (Signed)
Spoke to Lynn Gonzalez this afternoon to reschedule echo/monitor that she no-showed on 04/15/12. Patient rescheduled echo/monitor for 07/01/12 @ 2 pm. Pre-cert message has been sent to [name] [name].

## 2012-07-01 ENCOUNTER — Other Ambulatory Visit (HOSPITAL_COMMUNITY): Payer: Medicare Other

## 2012-07-02 ENCOUNTER — Other Ambulatory Visit: Payer: Self-pay | Admitting: Family

## 2012-07-02 NOTE — Telephone Encounter (Signed)
Rx to pharmacy/SLS 

## 2012-07-08 ENCOUNTER — Ambulatory Visit (HOSPITAL_COMMUNITY): Payer: Medicare Other | Attending: Internal Medicine | Admitting: Radiology

## 2012-07-08 ENCOUNTER — Other Ambulatory Visit: Payer: Self-pay

## 2012-07-08 DIAGNOSIS — R55 Syncope and collapse: Secondary | ICD-10-CM | POA: Insufficient documentation

## 2012-07-08 DIAGNOSIS — R071 Chest pain on breathing: Secondary | ICD-10-CM | POA: Insufficient documentation

## 2012-07-08 NOTE — Progress Notes (Signed)
Echocardiogram performed.  

## 2012-07-14 ENCOUNTER — Telehealth: Payer: Self-pay | Admitting: Family

## 2012-07-14 NOTE — Telephone Encounter (Signed)
pls call pt and let her know that I have reviewed her sleep study and it is negative for sleep apnea.

## 2012-07-20 NOTE — Telephone Encounter (Signed)
Notified pt. 

## 2012-07-21 ENCOUNTER — Other Ambulatory Visit: Payer: Self-pay | Admitting: Family

## 2012-07-27 ENCOUNTER — Encounter: Payer: Medicare Other | Admitting: Physician Assistant

## 2012-07-27 ENCOUNTER — Ambulatory Visit: Payer: Medicare Other | Admitting: Family

## 2012-08-03 ENCOUNTER — Telehealth: Payer: Self-pay | Admitting: *Deleted

## 2012-08-03 ENCOUNTER — Ambulatory Visit: Payer: Medicare Other | Admitting: Family

## 2012-08-03 NOTE — Telephone Encounter (Signed)
Office Message [address] [place], [place] [address] p. [phone] f. [phone] To: Lorrin Mais Fax: [phone] From: [facility] Date/ Time: 08/02/2012 9:17 PM Taken By: Catheryn Bacon, CSR Caller: Cecille Rubin Facility: not collected Patient: Lynn Gonzalez DOB: 12/18/1960 Phone: [phone] Reason for Call: See info below Regarding Appointment: Yes Appt Date: 08/03/2012 Appt Time: 10:30:00 AM Provider: Sandford Craze (Adults only) Reason: Cancel Appointment Details: Pt has to go out of town for emergency. Outcome: Cancelled appointment in EPIC [facility])

## 2012-08-04 ENCOUNTER — Encounter: Payer: Self-pay | Admitting: Family

## 2012-08-23 ENCOUNTER — Emergency Department (HOSPITAL_COMMUNITY)
Admission: EM | Admit: 2012-08-23 | Discharge: 2012-08-23 | Disposition: A | Discharge status: Home / Self Care | Payer: Medicare Other | Attending: Emergency Medicine | Admitting: Emergency Medicine

## 2012-08-23 ENCOUNTER — Encounter (HOSPITAL_COMMUNITY): Payer: Self-pay | Admitting: *Deleted

## 2012-08-23 DIAGNOSIS — Z8709 Personal history of other diseases of the respiratory system: Secondary | ICD-10-CM | POA: Insufficient documentation

## 2012-08-23 DIAGNOSIS — F319 Bipolar disorder, unspecified: Secondary | ICD-10-CM | POA: Insufficient documentation

## 2012-08-23 DIAGNOSIS — Z862 Personal history of diseases of the blood and blood-forming organs and certain disorders involving the immune mechanism: Secondary | ICD-10-CM | POA: Insufficient documentation

## 2012-08-23 DIAGNOSIS — Z8679 Personal history of other diseases of the circulatory system: Secondary | ICD-10-CM | POA: Insufficient documentation

## 2012-08-23 DIAGNOSIS — E049 Nontoxic goiter, unspecified: Secondary | ICD-10-CM | POA: Insufficient documentation

## 2012-08-23 DIAGNOSIS — Z79899 Other long term (current) drug therapy: Secondary | ICD-10-CM | POA: Insufficient documentation

## 2012-08-23 DIAGNOSIS — K219 Gastro-esophageal reflux disease without esophagitis: Secondary | ICD-10-CM | POA: Insufficient documentation

## 2012-08-23 DIAGNOSIS — N6009 Solitary cyst of unspecified breast: Secondary | ICD-10-CM | POA: Insufficient documentation

## 2012-08-23 DIAGNOSIS — Z8639 Personal history of other endocrine, nutritional and metabolic disease: Secondary | ICD-10-CM | POA: Insufficient documentation

## 2012-08-23 DIAGNOSIS — R109 Unspecified abdominal pain: Secondary | ICD-10-CM

## 2012-08-23 LAB — CBC WITH DIFFERENTIAL/PLATELET
Basophils Absolute: 0 10*3/uL (ref 0.0–0.1)
Basophils Relative: 0 % (ref 0–1)
Eosinophils Relative: 2 % (ref 0–5)
HCT: 40.8 % (ref 36.0–46.0)
MCH: 27 pg (ref 26.0–34.0)
MCHC: 32.1 g/dL (ref 30.0–36.0)
MCV: 84 fL (ref 78.0–100.0)
Monocytes Absolute: 0.4 10*3/uL (ref 0.1–1.0)
RDW: 14.3 % (ref 11.5–15.5)

## 2012-08-23 LAB — URINALYSIS, ROUTINE W REFLEX MICROSCOPIC
Glucose, UA: NEGATIVE mg/dL
Leukocytes, UA: NEGATIVE
Nitrite: NEGATIVE
Protein, ur: NEGATIVE mg/dL
pH: 6 (ref 5.0–8.0)

## 2012-08-23 LAB — COMPREHENSIVE METABOLIC PANEL
AST: 16 U/L (ref 0–37)
Albumin: 4 g/dL (ref 3.5–5.2)
BUN: 12 mg/dL (ref 6–23)
Calcium: 10.3 mg/dL (ref 8.4–10.5)
Creatinine, Ser: 0.72 mg/dL (ref 0.50–1.10)
GFR calc non Af Amer: >90 mL/min (ref >90)

## 2012-08-23 LAB — LIPASE, BLOOD: Lipase: 47 U/L (ref 11–59)

## 2012-08-23 MED ORDER — IBUPROFEN 800 MG PO TABS: 800.0000 mg | ORAL_TABLET | Freq: Three times a day (TID) | ORAL | Status: DC

## 2012-08-23 MED ORDER — CYCLOBENZAPRINE HCL 10 MG PO TABS: 10.0000 mg | ORAL_TABLET | Freq: Two times a day (BID) | ORAL | Status: DC | PRN

## 2012-08-23 NOTE — ED Notes (Signed)
MD at bedside. 

## 2012-08-23 NOTE — ED Provider Notes (Signed)
History     CSN: [id number]  Arrival date & time 08/23/12  1528   First MD Initiated Contact with Patient 08/23/12 1813      Chief Complaint  Patient presents with  . Abdominal Pain    (Consider location/radiation/quality/duration/timing/severity/associated sxs/prior treatment) HPI Comments: Lynn Gonzalez presents for evaluation of lower abdominal discomfort since helping a friend move about 10 days ago.  She denies fever, NVD, constipation, dysuria, vaginal pain/bleeding/discharge, melena, hematochezia, or hematemesis.  She denies back pain or bulging lower abd masses.  Patient is a 52 y.o. female presenting with abdominal pain. The history is provided by the patient. No language interpreter was used.  Abdominal Pain The primary symptoms of the illness include abdominal pain. The primary symptoms of the illness do not include fever, fatigue, shortness of breath, nausea, vomiting, diarrhea, hematemesis, hematochezia, dysuria, vaginal discharge or vaginal bleeding. The current episode started more than 2 days ago. The onset of the illness was gradual. The problem has not changed since onset. Symptoms associated with the illness do not include chills, anorexia, diaphoresis, heartburn, constipation, urgency, hematuria, frequency or back pain.    Past Medical History  Diagnosis Date  . Bipolar affective disorder 1985    ?nervous breakdown  . Thyroid disease     ? overactive thyroid  . Breast tumor   . B12 DEFICIENCY   . ANEMIA   . INTERSTITIAL LUNG DISEASE   . GASTROESOPHAGEAL REFLUX DISEASE   . BREAST CYST, LEFT   . GOITER   . Syncope     Past Surgical History  Procedure Date  . Tubal ligation 1985, 1996  . Gallbladder surgery 1985    stones removed  . Mediastinoscopy 2001  . Sternotomy 2001  . Bunionectomy     bilateral  . Abdominal hysterectomy 06/03/10  . Knee surgery 1987    bilateral  . Tonsillectomy     Family History  Problem Relation Age of Onset  . Diabetes  Mother   . Hypertension Mother   . Cancer Father     ?type  . Hypertension Father     History  Substance Use Topics  . Smoking status: Never Smoker   . Smokeless tobacco: Never Used  . Alcohol Use: No    OB History    Grav Para Term Preterm Abortions TAB SAB Ect Mult Living                  Review of Systems  Constitutional: Negative for fever, chills, diaphoresis and fatigue.  Respiratory: Negative for shortness of breath.   Gastrointestinal: Positive for abdominal pain. Negative for heartburn, nausea, vomiting, diarrhea, constipation, hematochezia, anorexia and hematemesis.  Genitourinary: Negative for dysuria, urgency, frequency, hematuria, vaginal bleeding and vaginal discharge.  Musculoskeletal: Negative for back pain.    Allergies  Latex  Home Medications   Current Outpatient Rx  Name  Route  Sig  Dispense  Refill  . ALBUTEROL SULFATE HFA 108 (90 BASE) MCG/ACT IN AERS   Inhalation   Inhale 2 puffs into the lungs every 6 (six) hours as needed. For shortness of breath.         . ESTRADIOL 2 MG PO TABS   Oral   Take 2 mg by mouth daily.         Marland Kitchen FOLIC ACID 1 MG PO TABS   Oral   Take 1 mg by mouth daily.         Marland Kitchen LITHIUM CARBONATE 300 MG PO TABS  Take 1 tablet every morning and take 2 tablets every night at bedtime.         Marland Kitchen PANTOPRAZOLE SODIUM 40 MG PO TBEC   Oral   Take 40 mg by mouth daily.         . QUETIAPINE FUMARATE 200 MG PO TABS   Oral   Take 200-300 mg by mouth at bedtime.          . COLCHICINE 0.6 MG PO TABS      2 tabs at start of gout flare followed by 1 tablet an hour later.   6 tablet   1   . SUMATRIPTAN SUCCINATE 50 MG PO TABS      50 mg. One tab by mouth at start of headache, may repeat in 2 hours once as needed. Migraine.           BP 131/96  Pulse 73  Temp 98.4 F (36.9 C) (Oral)  Resp 18  SpO2 100%  LMP 04/18/2010  Physical Exam  Nursing note and vitals reviewed. Constitutional: She is oriented  to person, place, and time. She appears well-developed and well-nourished. No distress.  HENT:  Head: Normocephalic and atraumatic.  Right Ear: External ear normal.  Left Ear: External ear normal.  Nose: Nose normal.  Mouth/Throat: Oropharynx is clear and moist. No oropharyngeal exudate.  Eyes: Conjunctivae normal are normal. Pupils are equal, round, and reactive to light. Right eye exhibits no discharge. Left eye exhibits no discharge. No scleral icterus.  Neck: Normal range of motion. Neck supple. No JVD present. No tracheal deviation present.  Cardiovascular: Normal rate, regular rhythm, normal heart sounds and intact distal pulses.  Exam reveals no gallop and no friction rub.   No murmur heard. Pulmonary/Chest: Effort normal and breath sounds normal. No stridor. No respiratory distress. She has no wheezes. She has no rales. She exhibits no tenderness.  Abdominal: Soft. Normal appearance and bowel sounds are normal. She exhibits no shifting dullness, no distension, no pulsatile liver, no fluid wave, no abdominal bruit, no ascites, no pulsatile midline mass and no mass. There is no hepatosplenomegaly. There is tenderness (diffuse lower). There is no rebound, no guarding and no CVA tenderness. No hernia.  Musculoskeletal: Normal range of motion. She exhibits no edema and no tenderness.  Lymphadenopathy:    She has no cervical adenopathy.  Neurological: She is alert and oriented to person, place, and time. No cranial nerve deficit.  Skin: Skin is warm and dry. No rash noted. She is not diaphoretic. No erythema. No pallor.  Psychiatric: She has a normal mood and affect. Her behavior is normal.    ED Course  Procedures (including critical care time)   Labs Reviewed  CBC WITH DIFFERENTIAL  COMPREHENSIVE METABOLIC PANEL  LIPASE, BLOOD  URINALYSIS, ROUTINE W REFLEX MICROSCOPIC   No results found.   No diagnosis found.    MDM  Pt presents for evaluation of abdominal pain.  She  appears nontoxic, note stable VS, NAD.  She has no evidence of peritonitis and no palpable hernia on exam.  Belly labs obtained during the triage process are completely normal.  At this time her hx and exam do not appear to be consistent with an acute intraabdominal process.  Plan symptomatic care and close follow-up with her primary physician.        Tobin Chad, MD 08/23/12 2001

## 2012-08-23 NOTE — ED Notes (Signed)
Pt c/o lower abd pain, described as sharp in nature. Denies vomiting or diarrhea. Reports pain gets better after lying still. Denies urinary symptoms also.

## 2012-08-24 ENCOUNTER — Ambulatory Visit: Payer: Medicare Other | Admitting: Family

## 2012-08-30 ENCOUNTER — Encounter: Payer: Medicare Other | Admitting: Physician Assistant

## 2012-09-02 ENCOUNTER — Emergency Department (HOSPITAL_COMMUNITY): Payer: Medicare Other

## 2012-09-02 ENCOUNTER — Emergency Department (HOSPITAL_COMMUNITY)
Admission: EM | Admit: 2012-09-02 | Discharge: 2012-09-02 | Disposition: A | Discharge status: Home / Self Care | Payer: Medicare Other | Attending: Emergency Medicine | Admitting: Emergency Medicine

## 2012-09-02 ENCOUNTER — Encounter (HOSPITAL_COMMUNITY): Payer: Self-pay | Admitting: Family Medicine

## 2012-09-02 DIAGNOSIS — R0789 Other chest pain: Secondary | ICD-10-CM | POA: Insufficient documentation

## 2012-09-02 DIAGNOSIS — Z79899 Other long term (current) drug therapy: Secondary | ICD-10-CM | POA: Insufficient documentation

## 2012-09-02 DIAGNOSIS — Z862 Personal history of diseases of the blood and blood-forming organs and certain disorders involving the immune mechanism: Secondary | ICD-10-CM | POA: Insufficient documentation

## 2012-09-02 DIAGNOSIS — Z8709 Personal history of other diseases of the respiratory system: Secondary | ICD-10-CM | POA: Insufficient documentation

## 2012-09-02 DIAGNOSIS — K219 Gastro-esophageal reflux disease without esophagitis: Secondary | ICD-10-CM

## 2012-09-02 DIAGNOSIS — R0602 Shortness of breath: Secondary | ICD-10-CM | POA: Insufficient documentation

## 2012-09-02 DIAGNOSIS — Z8639 Personal history of other endocrine, nutritional and metabolic disease: Secondary | ICD-10-CM | POA: Insufficient documentation

## 2012-09-02 DIAGNOSIS — J45909 Unspecified asthma, uncomplicated: Secondary | ICD-10-CM | POA: Insufficient documentation

## 2012-09-02 DIAGNOSIS — F319 Bipolar disorder, unspecified: Secondary | ICD-10-CM

## 2012-09-02 DIAGNOSIS — M25512 Pain in left shoulder: Secondary | ICD-10-CM

## 2012-09-02 DIAGNOSIS — R42 Dizziness and giddiness: Secondary | ICD-10-CM | POA: Insufficient documentation

## 2012-09-02 DIAGNOSIS — R209 Unspecified disturbances of skin sensation: Secondary | ICD-10-CM | POA: Insufficient documentation

## 2012-09-02 DIAGNOSIS — M25519 Pain in unspecified shoulder: Secondary | ICD-10-CM | POA: Insufficient documentation

## 2012-09-02 HISTORY — DX: Unspecified asthma, uncomplicated: J45.909

## 2012-09-02 LAB — POCT I-STAT, CHEM 8
BUN: 10 mg/dL (ref 6–23)
Sodium: 136 mEq/L (ref 135–145)
TCO2: 30 mmol/L (ref 0–100)

## 2012-09-02 LAB — BASIC METABOLIC PANEL
BUN: 8 mg/dL (ref 6–23)
Calcium: 9.8 mg/dL (ref 8.4–10.5)
Creatinine, Ser: 0.88 mg/dL (ref 0.50–1.10)
GFR calc Af Amer: 87 mL/min — ABNORMAL LOW (ref >90)
GFR calc non Af Amer: 75 mL/min — ABNORMAL LOW (ref >90)
Potassium: 4.7 mEq/L (ref 3.5–5.1)

## 2012-09-02 LAB — POCT I-STAT TROPONIN I: Troponin i, poc: 0 ng/mL (ref 0.00–0.08)

## 2012-09-02 MED ORDER — MORPHINE SULFATE 4 MG/ML IJ SOLN
4.0000 mg | Freq: Once | INTRAMUSCULAR | Status: AC
  Administered 2012-09-02: 4 mg via INTRAVENOUS
  Filled 2012-09-02: qty 1

## 2012-09-02 MED ORDER — ONDANSETRON HCL 4 MG/2ML IJ SOLN
4.0000 mg | Freq: Once | INTRAMUSCULAR | Status: AC
  Administered 2012-09-02: 4 mg via INTRAVENOUS
  Filled 2012-09-02: qty 2

## 2012-09-02 MED ORDER — HYDROCODONE-ACETAMINOPHEN 5-325 MG PO TABS: 1.0000 | ORAL_TABLET | Freq: Four times a day (QID) | ORAL | Status: DC | PRN

## 2012-09-02 NOTE — ED Notes (Signed)
Pt. Made aware for the need of urine. 

## 2012-09-02 NOTE — ED Provider Notes (Signed)
History     CSN: [id number]  Arrival date & time 09/02/12  1947   First MD Initiated Contact with Patient 09/02/12 2035      Chief Complaint  Patient presents with  . Chest Pain     HPI Patient states that she has had chest pain since yesterday. Reports shortness of breath and dizziness. Reports that she also has had numbness on her left side.    Past Medical History  Diagnosis Date  . Bipolar affective disorder 1985    ?nervous breakdown  . Thyroid disease     ? overactive thyroid  . Breast tumor   . B12 DEFICIENCY   . ANEMIA   . INTERSTITIAL LUNG DISEASE   . GASTROESOPHAGEAL REFLUX DISEASE   . BREAST CYST, LEFT   . GOITER   . Syncope   . Asthma     Past Surgical History  Procedure Date  . Tubal ligation 1985, 1996  . Gallbladder surgery 1985    stones removed  . Mediastinoscopy 2001  . Sternotomy 2001  . Bunionectomy     bilateral  . Abdominal hysterectomy 06/03/10  . Knee surgery 1987    bilateral  . Tonsillectomy     Family History  Problem Relation Age of Onset  . Diabetes Mother   . Hypertension Mother   . Cancer Father     ?type  . Hypertension Father     History  Substance Use Topics  . Smoking status: Never Smoker   . Smokeless tobacco: Never Used  . Alcohol Use: No    OB History    Grav Para Term Preterm Abortions TAB SAB Ect Mult Living                  Review of Systems All other systems reviewed and are negative Allergies  Latex  Home Medications   Current Outpatient Rx  Name  Route  Sig  Dispense  Refill  . ALBUTEROL SULFATE HFA 108 (90 BASE) MCG/ACT IN AERS   Inhalation   Inhale 2 puffs into the lungs every 6 (six) hours as needed. For shortness of breath.         . CYCLOBENZAPRINE HCL 10 MG PO TABS   Oral   Take 10 mg by mouth 2 (two) times daily as needed.         Marland Kitchen ESTRADIOL 2 MG PO TABS   Oral   Take 2 mg by mouth daily.         Marland Kitchen FOLIC ACID 1 MG PO TABS   Oral   Take 1 mg by mouth daily.           . IBUPROFEN 800 MG PO TABS   Oral   Take 800 mg by mouth every 8 (eight) hours as needed. For pain         . LITHIUM CARBONATE 300 MG PO TABS      Take 1 tablet every morning and take 2 tablets every night at bedtime.         Marland Kitchen PANTOPRAZOLE SODIUM 40 MG PO TBEC   Oral   Take 40 mg by mouth daily.         . QUETIAPINE FUMARATE 200 MG PO TABS   Oral   Take 200-300 mg by mouth at bedtime.          . COLCHICINE 0.6 MG PO TABS   Oral   Take 0.6 mg by mouth daily as needed. 2 tabs at start of  gout flare followed by 1 tablet an hour later.         Marland Kitchen HYDROCODONE-ACETAMINOPHEN 5-325 MG PO TABS   Oral   Take 1 tablet by mouth every 6 (six) hours as needed for pain.   30 tablet   0   . SUMATRIPTAN SUCCINATE 50 MG PO TABS      50 mg. One tab by mouth at start of headache, may repeat in 2 hours once as needed. Migraine.           BP 126/91  Pulse 83  Temp 98.3 F (36.8 C) (Oral)  Resp 20  SpO2 100%  LMP 04/18/2010  Physical Exam  Nursing note and vitals reviewed. Constitutional: She is oriented to person, place, and time. She appears well-developed and well-nourished. No distress.  HENT:  Head: Normocephalic and atraumatic.  Eyes: Pupils are equal, round, and reactive to light.  Neck: Normal range of motion.  Cardiovascular: Normal rate and intact distal pulses.   Pulmonary/Chest: Effort normal. No respiratory distress. She has no wheezes.  Abdominal: Normal appearance. She exhibits no distension.  Musculoskeletal: Normal range of motion.       Left shoulder: She exhibits tenderness, deformity and pain (pain increases with movement of shoulder). She exhibits normal pulse and normal strength.  Neurological: She is alert and oriented to person, place, and time. No cranial nerve deficit.  Skin: Skin is warm and dry. No rash noted.  Psychiatric: She has a normal mood and affect. Her behavior is normal.    ED Course  Procedures (including critical care  time) Patient had a normal echocardiogram in November.  Schedule for a stress test in the next week.   Date: 09/02/2012  Rate: 77  Rhythm: normal sinus rhythm  QRS Axis: normal  Intervals: normal  ST/T Wave abnormalities: Borderline T wave abnormalities  Conduction Disutrbances: none  Narrative Interpretation: Borderline EKG     Labs Reviewed  POCT I-STAT, CHEM 8 - Abnormal; Notable for the following:    Potassium 7.2 (*)     All other components within normal limits  BASIC METABOLIC PANEL - Abnormal; Notable for the following:    GFR calc non Af Amer 75 (*)     GFR calc Af Amer 87 (*)     All other components within normal limits  POCT I-STAT TROPONIN I   Dg Chest Portable 1 View  09/02/2012  *RADIOLOGY REPORT*  Clinical Data: Left chest pain, interstitial lung disease  PORTABLE CHEST - 1 VIEW  Comparison: 09/15/2011  Findings: Previous median sternotomy.  Heart size upper limits normal.  Lungs clear.  Mild elevation of left diaphragmatic leaflet as before.  No effusion.  IMPRESSION:  1.  No acute disease post median sternotomy.   Original Report Authenticated By: D. Andria Rhein, MD    I suspect the hyperkalemia is secondary to hemolysis.  Plan to repeat lab for confirmation. 1. Left shoulder pain   2. GERD (gastroesophageal reflux disease)   3. Atypical chest pain   4. Bipolar 1 disorder       MDM  In light of constant chest pain for over 24 hours and a normal troponin and unremarkable EKG I suspect ACS is not the cause of her problems.  This is also pain which is reproduced and made worse with moving her left shoulder.  I suspect musculoskeletal in origin.  She does have a stress test scheduled for next week which I encouraged her to keep.  She is in agreement  and wants to go home with plan.       Nelia Shi, MD 09/02/12 [phone]

## 2012-09-02 NOTE — ED Notes (Signed)
Patient states that she has had chest pain since yesterday. Reports shortness of breath and dizziness. Reports that she also has had numbness on her left side.

## 2012-09-14 ENCOUNTER — Telehealth: Payer: Self-pay

## 2012-09-14 ENCOUNTER — Ambulatory Visit: Payer: Medicare Other | Admitting: Family

## 2012-09-14 ENCOUNTER — Other Ambulatory Visit: Payer: Self-pay | Admitting: Family

## 2012-09-14 NOTE — Telephone Encounter (Signed)
ToLorrin Mais Fax: [phone] From: [facility] Date/ Time: 09/13/2012 11:34 PM Taken By: Reita Cliche, CSR Caller: Ivar Drape Facility: not collected Patient: Lynn Gonzalez DOB: 1960/12/17 Phone: [phone] Reason for Call: See info below Regarding Appointment: Yes Appt Date: 09/14/2012 Appt Time: 11:00:00 AM Provider: Sandford Craze (Adults only) Reason: Cancel Appointment Details: unable to make it Outcome: Cancelled appointment in EPIC [facility])

## 2012-09-14 NOTE — Telephone Encounter (Signed)
Appointment cancelled

## 2012-09-27 ENCOUNTER — Ambulatory Visit: Payer: Medicare Other | Admitting: Family

## 2012-10-12 ENCOUNTER — Encounter: Payer: Self-pay | Admitting: Family

## 2012-10-12 ENCOUNTER — Ambulatory Visit (INDEPENDENT_AMBULATORY_CARE_PROVIDER_SITE_OTHER): Payer: Medicare Other | Admitting: Family

## 2012-10-12 VITALS — BP 124/86 | HR 83 | Temp 97.5°F | Resp 16 | Ht 65.0 in | Wt 178.0 lb

## 2012-10-12 DIAGNOSIS — R030 Elevated blood-pressure reading, without diagnosis of hypertension: Secondary | ICD-10-CM

## 2012-10-12 DIAGNOSIS — F319 Bipolar disorder, unspecified: Secondary | ICD-10-CM

## 2012-10-12 DIAGNOSIS — E0590 Thyrotoxicosis, unspecified without thyrotoxic crisis or storm: Secondary | ICD-10-CM

## 2012-10-12 DIAGNOSIS — IMO0001 Reserved for inherently not codable concepts without codable children: Secondary | ICD-10-CM

## 2012-10-12 DIAGNOSIS — E538 Deficiency of other specified B group vitamins: Secondary | ICD-10-CM

## 2012-10-12 DIAGNOSIS — D649 Anemia, unspecified: Secondary | ICD-10-CM

## 2012-10-12 MED ORDER — CYANOCOBALAMIN 1000 MCG/ML IJ SOLN
1000.0000 ug | Freq: Once | INTRAMUSCULAR | Status: AC
  Administered 2012-10-12: 1000 ug via INTRAMUSCULAR

## 2012-10-12 MED ORDER — FOLIC ACID 1 MG PO TABS: 1.0000 mg | ORAL_TABLET | Freq: Every day | ORAL | Status: DC

## 2012-10-12 NOTE — Assessment & Plan Note (Addendum)
BP Readings from Last 3 Encounters:  10/12/12 124/86  09/02/12 124/95  08/23/12 131/96   Will monitor for now.  Pt is instructed on a low sodium diet.

## 2012-10-12 NOTE — Assessment & Plan Note (Signed)
Obtain TSH °

## 2012-10-12 NOTE — Progress Notes (Signed)
Subjective:    Patient ID: Lynn Gonzalez, female    DOB: 10-Sep-1960, 52 y.o.   MRN: [id number]  HPI  Lynn Gonzalez is a 52 yr old female who presents today for follow up.  1) pernicious anemia- CBC drawn 1 month ago noted normal H/H.  She is due for her monthly b12 injection today.  2) Elevated blood pressure- reports that she has had some elevated blood pressures at other offices.  3) Hyperthyroid-  Reports heat intolerance.  4) Bipolar- She is followed by mental health clinic in [place].     Review of Systems    see HPI  Past Medical History  Diagnosis Date  . Bipolar affective disorder 1985    ?nervous breakdown  . Thyroid disease     ? overactive thyroid  . Breast tumor   . B12 DEFICIENCY   . ANEMIA   . INTERSTITIAL LUNG DISEASE   . GASTROESOPHAGEAL REFLUX DISEASE   . BREAST CYST, LEFT   . GOITER   . Syncope   . Asthma     History   Social History  . Marital Status: Single    Spouse Name: N/A    Number of Children: 2  . Years of Education: N/A   Occupational History  .      Disability   Social History Main Topics  . Smoking status: Never Smoker   . Smokeless tobacco: Never Used  . Alcohol Use: No  . Drug Use: No  . Sexually Active: Yes    Birth Control/ Protection: None   Other Topics Concern  . Not on file   Social History Narrative   single   Regular exercise:  Yes                Past Surgical History  Procedure Laterality Date  . Tubal ligation  1985, 1996  . Gallbladder surgery  1985    stones removed  . Mediastinoscopy  2001  . Sternotomy  2001  . Bunionectomy      bilateral  . Abdominal hysterectomy  06/03/10  . Knee surgery  1987    bilateral  . Tonsillectomy      Family History  Problem Relation Age of Onset  . Diabetes Mother   . Hypertension Mother   . Cancer Father     ?type  . Hypertension Father     Allergies  Allergen Reactions  . Latex Rash    Current Outpatient Prescriptions on File Prior to Visit   Medication Sig Dispense Refill  . albuterol (PROVENTIL HFA;VENTOLIN HFA) 108 (90 BASE) MCG/ACT inhaler Inhale 2 puffs into the lungs every 6 (six) hours as needed. For shortness of breath.      . colchicine 0.6 MG tablet Take 0.6 mg by mouth daily as needed. 2 tabs at start of gout flare followed by 1 tablet an hour later.      . cyclobenzaprine (FLEXERIL) 10 MG tablet Take 10 mg by mouth 2 (two) times daily as needed.      Marland Kitchen estradiol (ESTRACE) 2 MG tablet take 1 tablet by mouth once daily  30 tablet  0  . HYDROcodone-acetaminophen (NORCO) 5-325 MG per tablet Take 1 tablet by mouth every 6 (six) hours as needed for pain.  30 tablet  0  . ibuprofen (ADVIL,MOTRIN) 800 MG tablet Take 800 mg by mouth every 8 (eight) hours as needed. For pain      . lithium 300 MG tablet Take 1 tablet every morning and take  2 tablets every night at bedtime.      . pantoprazole (PROTONIX) 40 MG tablet Take 40 mg by mouth daily.      . QUEtiapine (SEROQUEL) 200 MG tablet Take 200-300 mg by mouth at bedtime.       . SUMAtriptan (IMITREX) 50 MG tablet 50 mg. One tab by mouth at start of headache, may repeat in 2 hours once as needed. Migraine.      . folic acid (FOLVITE) 1 MG tablet Take 1 mg by mouth daily.       No current facility-administered medications on file prior to visit.    BP 124/86  Pulse 83  Temp(Src) 97.5 F (36.4 C) (Oral)  Resp 16  Ht 5\' 5"  (1.651 m)  Wt 178 lb (80.74 kg)  BMI 29.62 kg/m2  SpO2 97%  LMP 04/18/2010    Objective:   Physical Exam  Constitutional: She is oriented to person, place, and time. She appears well-developed and well-nourished. No distress.  Cardiovascular: Normal rate and regular rhythm.   No murmur heard. Pulmonary/Chest: Effort normal and breath sounds normal. No respiratory distress. She has no wheezes. She has no rales. She exhibits no tenderness.  Neurological: She is alert and oriented to person, place, and time.  Skin: Skin is warm and dry.  Psychiatric:  She has a normal mood and affect. Her behavior is normal. Judgment and thought content normal.          Assessment & Plan:

## 2012-10-12 NOTE — Assessment & Plan Note (Signed)
Stable, continue b12 injections. 

## 2012-10-12 NOTE — Assessment & Plan Note (Signed)
Clinically stable.  Follows with mental health.

## 2012-10-12 NOTE — Addendum Note (Signed)
Addended by: Mervin Kung A on: 10/12/2012 08:33 AM   Modules accepted: Orders

## 2012-10-12 NOTE — Patient Instructions (Addendum)
Please complete your blood work prior to leaving. Please schedule a follow up appointment in 3 months.  

## 2012-10-13 ENCOUNTER — Encounter: Payer: Self-pay | Admitting: Family

## 2012-10-27 ENCOUNTER — Telehealth: Payer: Self-pay | Admitting: *Deleted

## 2012-10-27 ENCOUNTER — Encounter (INDEPENDENT_AMBULATORY_CARE_PROVIDER_SITE_OTHER): Payer: Medicare Other

## 2012-10-27 ENCOUNTER — Ambulatory Visit (INDEPENDENT_AMBULATORY_CARE_PROVIDER_SITE_OTHER): Payer: Medicare Other | Admitting: Physician Assistant

## 2012-10-27 ENCOUNTER — Encounter: Payer: Self-pay | Admitting: Cardiology

## 2012-10-27 ENCOUNTER — Encounter (HOSPITAL_COMMUNITY): Payer: Self-pay | Admitting: *Deleted

## 2012-10-27 ENCOUNTER — Emergency Department (HOSPITAL_COMMUNITY)
Admission: EM | Admit: 2012-10-27 | Discharge: 2012-10-27 | Disposition: A | Discharge status: Home / Self Care | Payer: Medicare Other | Attending: Emergency Medicine | Admitting: Emergency Medicine

## 2012-10-27 DIAGNOSIS — J45909 Unspecified asthma, uncomplicated: Secondary | ICD-10-CM | POA: Insufficient documentation

## 2012-10-27 DIAGNOSIS — Z8709 Personal history of other diseases of the respiratory system: Secondary | ICD-10-CM | POA: Insufficient documentation

## 2012-10-27 DIAGNOSIS — R51 Headache: Secondary | ICD-10-CM

## 2012-10-27 DIAGNOSIS — Y9229 Other specified public building as the place of occurrence of the external cause: Secondary | ICD-10-CM | POA: Insufficient documentation

## 2012-10-27 DIAGNOSIS — S0993XA Unspecified injury of face, initial encounter: Secondary | ICD-10-CM | POA: Insufficient documentation

## 2012-10-27 DIAGNOSIS — R0989 Other specified symptoms and signs involving the circulatory and respiratory systems: Secondary | ICD-10-CM

## 2012-10-27 DIAGNOSIS — W2209XA Striking against other stationary object, initial encounter: Secondary | ICD-10-CM | POA: Insufficient documentation

## 2012-10-27 DIAGNOSIS — S199XXA Unspecified injury of neck, initial encounter: Secondary | ICD-10-CM | POA: Insufficient documentation

## 2012-10-27 DIAGNOSIS — Z8639 Personal history of other endocrine, nutritional and metabolic disease: Secondary | ICD-10-CM | POA: Insufficient documentation

## 2012-10-27 DIAGNOSIS — Z79899 Other long term (current) drug therapy: Secondary | ICD-10-CM | POA: Insufficient documentation

## 2012-10-27 DIAGNOSIS — F319 Bipolar disorder, unspecified: Secondary | ICD-10-CM | POA: Insufficient documentation

## 2012-10-27 DIAGNOSIS — Y9389 Activity, other specified: Secondary | ICD-10-CM | POA: Insufficient documentation

## 2012-10-27 DIAGNOSIS — S0990XA Unspecified injury of head, initial encounter: Secondary | ICD-10-CM | POA: Insufficient documentation

## 2012-10-27 DIAGNOSIS — Z8669 Personal history of other diseases of the nervous system and sense organs: Secondary | ICD-10-CM | POA: Insufficient documentation

## 2012-10-27 DIAGNOSIS — D649 Anemia, unspecified: Secondary | ICD-10-CM | POA: Insufficient documentation

## 2012-10-27 DIAGNOSIS — R55 Syncope and collapse: Secondary | ICD-10-CM

## 2012-10-27 DIAGNOSIS — K219 Gastro-esophageal reflux disease without esophagitis: Secondary | ICD-10-CM | POA: Insufficient documentation

## 2012-10-27 DIAGNOSIS — Z8742 Personal history of other diseases of the female genital tract: Secondary | ICD-10-CM | POA: Insufficient documentation

## 2012-10-27 DIAGNOSIS — Z862 Personal history of diseases of the blood and blood-forming organs and certain disorders involving the immune mechanism: Secondary | ICD-10-CM | POA: Insufficient documentation

## 2012-10-27 MED ORDER — HYDROCODONE-ACETAMINOPHEN 5-325 MG PO TABS
2.0000 | ORAL_TABLET | Freq: Once | ORAL | Status: AC
  Administered 2012-10-27: 2 via ORAL
  Filled 2012-10-27: qty 2

## 2012-10-27 NOTE — Progress Notes (Deleted)
Exercise Treadmill Test  Pre-Exercise Testing Evaluation Rhythm: {CHL RHYTHM BASELINE EKG FOR XBJ:47829562}  Rate: 79                 Test  Exercise Tolerance Test Ordering MD: Olga Millers, MD  Interpreting MD: Tereso Newcomer, PA-C  Unique Test No: 1  Treadmill:  1  Indication for ETT: Chest pain, syncope  Contraindication to ETT: No   Stress Modality: exercise - treadmill  Cardiac Imaging Performed: non   Protocol: standard Bruce - maximal  Max BP:  ***/***  Max MPHR (bpm):  170 85% MPR (bpm):  145  MPHR obtained (bpm):  *** % MPHR obtained:  ***  Reached 85% MPHR (min:sec):  *** Total Exercise Time (min-sec):  ***  Workload in METS:  *** Borg Scale: ***  Reason ETT Terminated:  {CHL REASON TERMINATED FOR ZHY:86578469}    ST Segment Analysis At Rest: {CHL ST SEGMENT AT REST FOR GEX:52841324} With Exercise: {CHL ST SEGMENT WITH EXERCISE FOR MWN:02725366}  Other Information Arrhythmia:  {CHL ARRHYTHMIA FOR YQI:34742595} Angina during ETT:  {CHL ANGINA DURING GLO:75643329} Quality of ETT:  {CHL QUALITY OF JJO:84166063}  ETT Interpretation:  {CHL INTERPRETATION FOR KZS:01093235}  Comments: ***  Recommendations: ***
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
## 2012-10-27 NOTE — Telephone Encounter (Signed)
21 day event monitor placed on Pt 10/27/12 TK 

## 2012-10-29 ENCOUNTER — Telehealth: Payer: Self-pay | Admitting: *Deleted

## 2012-10-29 ENCOUNTER — Ambulatory Visit: Payer: Medicare Other | Admitting: Family

## 2012-10-29 MED ORDER — CYCLOBENZAPRINE HCL 10 MG PO TABS: 5.0000 mg | ORAL_TABLET | Freq: Two times a day (BID) | ORAL | Status: DC | PRN

## 2012-10-29 NOTE — Telephone Encounter (Addendum)
I sent refill on flexeril to pharmacy.  If she has fever, worsening HA, worsening stiffness of neck or if fever she should be re-evaluated in the ED over the weekend. Spoke to pt she is aware.

## 2012-10-29 NOTE — Telephone Encounter (Signed)
Pt called stating she is unable to keep her appt today for ER f/u. Pt states she continues to have neck pain, lip still feels swollen and now feels like she is having some stiffness in her neck. Pt declines to return to the ER over the weekend for new onset of neck stiffness and wants to know if we can prescribe her something for her pain?  Pt has r/s appt for Monday morning.  Please advise.

## 2012-11-01 ENCOUNTER — Ambulatory Visit (INDEPENDENT_AMBULATORY_CARE_PROVIDER_SITE_OTHER): Payer: Medicare Other | Admitting: Family

## 2012-11-01 ENCOUNTER — Encounter: Payer: Self-pay | Admitting: Family

## 2012-11-01 VITALS — BP 118/86 | HR 97 | Temp 98.0°F | Resp 16 | Wt 178.0 lb

## 2012-11-01 DIAGNOSIS — S0993XA Unspecified injury of face, initial encounter: Secondary | ICD-10-CM

## 2012-11-01 DIAGNOSIS — S199XXA Unspecified injury of neck, initial encounter: Secondary | ICD-10-CM

## 2012-11-01 DIAGNOSIS — M25561 Pain in right knee: Secondary | ICD-10-CM

## 2012-11-01 DIAGNOSIS — R55 Syncope and collapse: Secondary | ICD-10-CM

## 2012-11-01 DIAGNOSIS — M25569 Pain in unspecified knee: Secondary | ICD-10-CM | POA: Insufficient documentation

## 2012-11-01 NOTE — ED Provider Notes (Signed)
Medical screening examination/treatment/procedure(s) were performed by non-physician practitioner and as supervising physician I was immediately available for consultation/collaboration.  Christopher J. Pollina, MD 11/01/12 2347 

## 2012-11-01 NOTE — Assessment & Plan Note (Signed)
She is wearing event monitor. Has had irritation at lead site, though exam shows no rash today. Continue as tolerated.

## 2012-11-01 NOTE — Assessment & Plan Note (Signed)
Improving, recommended prn advil or tylenol. Should be self limiting over then next few weeks.

## 2012-11-01 NOTE — Patient Instructions (Addendum)
Please follow up in 2 weeks for B12 injection. You may use motrin as needed for pain.

## 2012-11-01 NOTE — Assessment & Plan Note (Signed)
No pain today. Prn motrin, monitor.
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
## 2012-11-04 ENCOUNTER — Telehealth: Payer: Self-pay | Admitting: Family

## 2012-11-04 DIAGNOSIS — E538 Deficiency of other specified B group vitamins: Secondary | ICD-10-CM

## 2012-11-04 NOTE — Telephone Encounter (Signed)
Informed patient that b12 inj were on back order and that we would call her when our office received more

## 2012-11-10 ENCOUNTER — Telehealth: Payer: Self-pay | Admitting: Family

## 2012-11-10 ENCOUNTER — Ambulatory Visit (INDEPENDENT_AMBULATORY_CARE_PROVIDER_SITE_OTHER): Payer: Medicare Other | Admitting: Physician Assistant

## 2012-11-10 DIAGNOSIS — R9439 Abnormal result of other cardiovascular function study: Secondary | ICD-10-CM

## 2012-11-10 DIAGNOSIS — R079 Chest pain, unspecified: Secondary | ICD-10-CM

## 2012-11-10 NOTE — Telephone Encounter (Signed)
Pls call pt and let her know that I reviewed her stress test and cardiology would like her to complete additional testing.

## 2012-11-10 NOTE — Progress Notes (Signed)
Exercise Treadmill Test  Pre-Exercise Testing Evaluation Rhythm: normal sinus  Rate: 75                 Test  Exercise Tolerance Test Ordering MD: Olga Millers, MD  Interpreting MD: Tereso Newcomer, PA-C  Unique Test No: 1  Treadmill:  1  Indication for ETT: chest pain - rule out ischemia  Contraindication to ETT: No   Stress Modality: exercise - treadmill  Cardiac Imaging Performed: non   Protocol: standard Bruce - maximal  Max BP:  154/72  Max MPHR (bpm):  170 85% MPR (bpm):  145  MPHR obtained (bpm):  137 % MPHR obtained:  80%  Reached 85% MPHR (min:sec):  n/a Total Exercise Time (min-sec):  6:19  Workload in METS:  7.0 Borg Scale: 17  Reason ETT Terminated:  fatigue    ST Segment Analysis At Rest: normal ST segments - no evidence of significant ST depression With Exercise: borderline ST changes  Other Information Arrhythmia:  No Angina during ETT:  present (1) Quality of ETT:  indeterminate  ETT Interpretation:  borderline (indeterminate) with non-specific ST changes  Comments: Poor exercise tolerance. Patient did complain of chest pain. Normal BP response to exercise. Borderline ST changes at sub-maximal exercise.   Recommendations: Schedule Lexiscan Myoview. Luna Glasgow, PA-C  11:20 AM 11/10/2012

## 2012-11-10 NOTE — Telephone Encounter (Signed)
Notified pt and she has already been contacted by cardiology.

## 2012-11-13 ENCOUNTER — Ambulatory Visit (INDEPENDENT_AMBULATORY_CARE_PROVIDER_SITE_OTHER): Payer: Medicare Other | Admitting: Family Medicine

## 2012-11-13 ENCOUNTER — Encounter: Payer: Self-pay | Admitting: Family Medicine

## 2012-11-13 VITALS — BP 110/80 | HR 80 | Temp 98.0°F | Wt 180.0 lb

## 2012-11-13 DIAGNOSIS — R21 Rash and other nonspecific skin eruption: Secondary | ICD-10-CM | POA: Insufficient documentation

## 2012-11-13 DIAGNOSIS — M25551 Pain in right hip: Secondary | ICD-10-CM

## 2012-11-13 DIAGNOSIS — M25559 Pain in unspecified hip: Secondary | ICD-10-CM

## 2012-11-13 DIAGNOSIS — H9202 Otalgia, left ear: Secondary | ICD-10-CM | POA: Insufficient documentation

## 2012-11-13 DIAGNOSIS — H9209 Otalgia, unspecified ear: Secondary | ICD-10-CM

## 2012-11-13 MED ORDER — TRIAMCINOLONE ACETONIDE 0.1 % EX OINT: TOPICAL_OINTMENT | Freq: Two times a day (BID) | CUTANEOUS | Status: DC

## 2012-11-13 MED ORDER — TRAMADOL HCL 50 MG PO TABS: 50.0000 mg | ORAL_TABLET | Freq: Three times a day (TID) | ORAL | Status: DC | PRN

## 2012-11-13 NOTE — Progress Notes (Signed)
  Subjective:    Patient ID: Orlene Och Quilter, female    DOB: [birth year], 52 y.o.   MRN: [id number]  HPI Ear pain- L sided, sxs started 2-3 weeks ago.  No fever.  No drainage.  Pt reports frequent qtip use.  Pain is worse w/ lying down.  No pain in R ear.  Taking Motrin prn.  R hip pain- sxs started >1 month ago.  Has seen PCP.  + TTP over R groin.  Feels as if leg is going to give out.  No relief w/ motrin.  'my leg is killing me, i can hardly walk on it'.  Rash on L forearm- 1st noticed Monday, 'itch real bad'.  Using Vasoline.  No one w/ similar rash.  No change is soap, laundry detergent. No known exposures.   Review of Systems For ROS see HPI     Objective:   Physical Exam  Vitals reviewed. Constitutional: She appears well-developed and well-nourished. No distress.  HENT:  Head: Normocephalic and atraumatic.  Nose: Nose normal.  Mouth/Throat: Oropharynx is clear and moist. No oropharyngeal exudate.  TMs WNL, not retracted  Neck: Normal range of motion. Neck supple.  Mild TTP over strap muscles on L  Pulmonary/Chest: Effort normal and breath sounds normal. No respiratory distress. She has no wheezes. She has no rales.  Musculoskeletal:  R hip pain w/ leg flexion, extension, internal/external rotation.  + TTP over adductors  Lymphadenopathy:    She has no cervical adenopathy.  Skin: Skin is warm and dry. Rash (scattered excoriations along L forearm w/ surrounding erythema) noted.          Assessment & Plan:

## 2012-11-13 NOTE — Assessment & Plan Note (Signed)
New.  No obvious cause.  Start topical steroid ointment prn.

## 2012-11-13 NOTE — Assessment & Plan Note (Signed)
New.  No obvious cause of ear pain identified.  Area of tenderness on exam is actually L strap muscles and not ear.  Pt has NSAIDs, muscle relaxer, and now Tramadol available.  If no improvement, will need f/u w/ PCP and possibly ENT.

## 2012-11-13 NOTE — Patient Instructions (Addendum)
Follow up w/ [facility] call you with your ortho appt Use the Tramadol as needed for pain Continue the Motrin and the flexeril as needed Heating pad for pain relief Triamcinolone twice daily on the rash on your arm If your ear continues to hurt, you may need to see ENT in the future Call with any questions or concerns Hang in there!

## 2012-11-13 NOTE — Assessment & Plan Note (Signed)
New.  Pt w/ pain in groin which is suggestive of true hip pain.  Already on high dose NSAIDs and flexeril.  Start Ultram.  Pt needs ortho referral for complete evaluation and tx.

## 2012-11-16 NOTE — Telephone Encounter (Signed)
Per supply dept, b12 is still on manufacturer back order with no expected release date. States we are unable to order product from any other manufacturer.  Please advise. 

## 2012-11-18 ENCOUNTER — Other Ambulatory Visit: Payer: Self-pay | Admitting: Family

## 2012-11-18 ENCOUNTER — Ambulatory Visit: Payer: Medicare Other

## 2012-11-18 MED ORDER — CYANOCOBALAMIN 500 MCG/0.1ML NA SOLN: NASAL | Status: DC

## 2012-11-18 NOTE — Telephone Encounter (Signed)
Notified pt and she is agreeable to try nasal b12. Future lab order entered. Pt reports that she recently saw Dr Beverely Low and was referred to Orthopedics for hip pain. Pt tells me that she received a call that ortho wants her to have a bone scan and she is awaiting appt date / time.

## 2012-11-18 NOTE — Telephone Encounter (Signed)
Will try nasal b12.  Rx sent, repeat b12 level in 8 weeks, dx is b12 deficiency.

## 2012-11-22 ENCOUNTER — Other Ambulatory Visit (HOSPITAL_COMMUNITY): Payer: Self-pay | Admitting: Orthopedic Surgery

## 2012-11-22 DIAGNOSIS — M25559 Pain in unspecified hip: Secondary | ICD-10-CM

## 2012-11-22 DIAGNOSIS — R102 Pelvic and perineal pain: Secondary | ICD-10-CM

## 2012-11-23 ENCOUNTER — Telehealth: Payer: Self-pay | Admitting: *Deleted

## 2012-11-23 DIAGNOSIS — H9209 Otalgia, unspecified ear: Secondary | ICD-10-CM

## 2012-11-23 NOTE — Telephone Encounter (Signed)
Pt left message on voicemail requesting referral to ENT as she is still having ear pain and noted some drainage from same ear.  Please advise.

## 2012-11-23 NOTE — Telephone Encounter (Signed)
Spoke with pt, aware monitor reviewed by dr crenshaw shows sinus 

## 2012-11-24 ENCOUNTER — Ambulatory Visit (HOSPITAL_COMMUNITY): Payer: Medicare Other | Attending: Cardiovascular Disease | Admitting: Radiology

## 2012-11-24 VITALS — BP 136/97 | Ht 66.0 in | Wt 180.0 lb

## 2012-11-24 DIAGNOSIS — R079 Chest pain, unspecified: Secondary | ICD-10-CM

## 2012-11-24 DIAGNOSIS — J45909 Unspecified asthma, uncomplicated: Secondary | ICD-10-CM | POA: Insufficient documentation

## 2012-11-24 DIAGNOSIS — R9431 Abnormal electrocardiogram [ECG] [EKG]: Secondary | ICD-10-CM | POA: Insufficient documentation

## 2012-11-24 DIAGNOSIS — R9439 Abnormal result of other cardiovascular function study: Secondary | ICD-10-CM

## 2012-11-24 MED ORDER — TECHNETIUM TC 99M SESTAMIBI GENERIC - CARDIOLITE
10.0000 | Freq: Once | INTRAVENOUS | Status: AC | PRN
  Administered 2012-11-24: 10 via INTRAVENOUS

## 2012-11-24 MED ORDER — TECHNETIUM TC 99M SESTAMIBI GENERIC - CARDIOLITE
30.0000 | Freq: Once | INTRAVENOUS | Status: AC | PRN
  Administered 2012-11-24: 30 via INTRAVENOUS

## 2012-11-24 MED ORDER — REGADENOSON 0.4 MG/5ML IV SOLN
0.4000 mg | Freq: Once | INTRAVENOUS | Status: AC
  Administered 2012-11-24: 0.4 mg via INTRAVENOUS

## 2012-11-24 NOTE — Progress Notes (Signed)
[facility] SITE 3 NUCLEAR MED [address] [place], [place] [address] [phone]    Cardiology Nuclear Med Study  Lynn Gonzalez is a 52 y.o. female     MRN : [id number]     DOB: Jul 31, 1961  Procedure Date: 11/24/2012  Nuclear Med Background Indication for Stress Test:  Evaluation for Ischemia and Abnormal EKG History:  Asthma and 07/08/12 ECHO: EF: 55%, 11/10/12 GXT borderline/indeterminate with Non specific ST T changes   poor  exercise tolerance Cardiac Risk Factors: n/a  Symptoms:  Chest Pain   Nuclear Pre-Procedure Caffeine/Decaff Intake:  None > 12 hrs NPO After: 8:00pm   Lungs:  clear O2 Sat: 96% on room air. IV 0.9% NS with Angio Cath:  22g  IV Site: R Forearm x 1, tolerated well  IV Started by:  Irean Hong, RN  Chest Size (in):  36 Cup Size: B  Height: 5\' 6"  (1.676 m)  Weight:  180 lb (81.647 kg)  BMI:  Body mass index is 29.07 kg/(m^2). Tech Comments:  n/a    Nuclear Med Study 1 or 2 day study: 1 day  Stress Test Type:  Lexiscan  Reading MD: Kristeen Miss, MD  Order Authorizing Provider:  Olga Millers, MD, and Tereso Newcomer, [facility]  Resting Radionuclide: Technetium 45m Sestamibi  Resting Radionuclide Dose: 11.0 mCi   Stress Radionuclide:  Technetium 50m Sestamibi  Stress Radionuclide Dose: 33.0 mCi           Stress Protocol Rest HR: 71 Stress HR: 96  Rest BP: 136/97 Stress BP: 139/85  Exercise Time (min): n/a METS: n/a   Predicted Max HR: 170 bpm % Max HR: 56.47 bpm Rate Pressure Product: 78469   Dose of Adenosine (mg):  n/a Dose of Lexiscan: 0.4 mg  Dose of Atropine (mg): n/a Dose of Dobutamine: n/a mcg/kg/min (at max HR)  Stress Test Technologist: Milana Na, EMT-P  Nuclear Technologist:  Domenic Polite, CNMT     Rest Procedure:  Myocardial perfusion imaging was performed at rest 45 minutes following the intravenous administration of Technetium 85m Sestamibi. Rest ECG: NSR - Normal EKG  Stress Procedure:  The patient  received IV Lexiscan 0.4 mg over 15-seconds.  Technetium 56m Sestamibi injected at 30-seconds.  This patient had abdominal cramps, warm feet , and was lt.headed with the Lexiscan injection. Quantitative spect images were obtained after a 45 minute delay. Stress ECG: No significant change from baseline ECG  QPS Raw Data Images:  Normal; no motion artifact; normal heart/lung ratio. Stress Images:  Normal homogeneous uptake in all areas of the myocardium. Rest Images:  Normal homogeneous uptake in all areas of the myocardium. Subtraction (SDS):  No evidence of ischemia. Transient Ischemic Dilatation (Normal <1.22):  1.02 Lung/Heart Ratio (Normal <0.45):  0.29  Quantitative Gated Spect Images QGS EDV:  76 ml QGS ESV:  21 ml  Impression Exercise Capacity:  Lexiscan with no exercise. BP Response:  Normal blood pressure response. Clinical Symptoms:  No significant symptoms noted. ECG Impression:  No significant ST segment change suggestive of ischemia. Comparison with Prior Nuclear Study: No images to compare  Overall Impression:  Normal stress nuclear study.  No evidence of ischemia.  Normal LV function.   LV Ejection Fraction: 72%.  LV Wall Motion:  NL LV Function; NL Wall Motion   Vesta Mixer, Montez Hageman., MD, [facility] 11/24/2012, 3:57 PM Office - [phone] Pager [phone]

## 2012-11-25 ENCOUNTER — Encounter: Payer: Self-pay | Admitting: Physician Assistant

## 2012-11-25 ENCOUNTER — Encounter (HOSPITAL_COMMUNITY): Payer: Medicare Other

## 2012-11-26 ENCOUNTER — Telehealth: Payer: Self-pay | Admitting: *Deleted

## 2012-11-26 NOTE — Telephone Encounter (Signed)
pt notified about myoview results with verbal understanding 

## 2012-11-30 ENCOUNTER — Encounter (HOSPITAL_COMMUNITY)
Admission: RE | Admit: 2012-11-30 | Discharge: 2012-11-30 | Disposition: A | Discharge status: Home / Self Care | Payer: Medicare Other | Source: Ambulatory Visit | Attending: Orthopedic Surgery | Admitting: Orthopedic Surgery

## 2012-11-30 DIAGNOSIS — M47817 Spondylosis without myelopathy or radiculopathy, lumbosacral region: Secondary | ICD-10-CM | POA: Insufficient documentation

## 2012-11-30 DIAGNOSIS — N949 Unspecified condition associated with female genital organs and menstrual cycle: Secondary | ICD-10-CM | POA: Insufficient documentation

## 2012-11-30 DIAGNOSIS — R102 Pelvic and perineal pain: Secondary | ICD-10-CM

## 2012-11-30 DIAGNOSIS — M25559 Pain in unspecified hip: Secondary | ICD-10-CM

## 2012-11-30 MED ORDER — TECHNETIUM TC 99M MEDRONATE IV KIT
25.0000 | PACK | Freq: Once | INTRAVENOUS | Status: AC | PRN
  Administered 2012-11-30: 26.2 via INTRAVENOUS

## 2012-12-06 ENCOUNTER — Encounter: Payer: Self-pay | Admitting: *Deleted

## 2012-12-07 ENCOUNTER — Ambulatory Visit: Payer: Medicare Other | Admitting: Family

## 2012-12-13 ENCOUNTER — Other Ambulatory Visit: Payer: Self-pay | Admitting: Family

## 2012-12-13 NOTE — Telephone Encounter (Signed)
Rx request to pharmacy/SLS  

## 2012-12-21 ENCOUNTER — Other Ambulatory Visit: Payer: Self-pay | Admitting: Orthopedic Surgery

## 2012-12-28 ENCOUNTER — Encounter (HOSPITAL_COMMUNITY): Payer: Self-pay | Admitting: Pharmacy Technician

## 2013-01-05 ENCOUNTER — Other Ambulatory Visit (HOSPITAL_COMMUNITY): Payer: Self-pay | Admitting: Orthopedic Surgery

## 2013-01-05 NOTE — Patient Instructions (Addendum)
Lynn Gonzalez  01/05/2013   Your procedure is scheduled on: 01-13-2013  Report to [facility] at  1000 AM.  Call this number if you have problems the morning of surgery [phone]   Remember: please bring inhaler on day of surgery   Do not eat food or drink liquids :After Midnight.     Take these medicines the morning of surgery with A SIP OF WATER: lithium, protonix, inhaler if needed                                SEE [place] PREPARING FOR SURGERY SHEET   Do not wear jewelry, make-up or nail polish.  Do not wear lotions, powders, or perfumes. You may wear deodorant.   Men may shave face and neck.  Do not bring valuables to the hospital.  Contacts, dentures or bridgework may not be worn into surgery.  Leave suitcase in the car. After surgery it may be brought to your room.  For patients admitted to the hospital, checkout time is 11:00 AM the day of discharge.    Please read over the following fact sheets that you were given: MRSA Information.  Call Birdie Sons RN pre op nurse if needed [phone]    FAILURE TO FOLLOW THESE INSTRUCTIONS MAY RESULT IN THE CANCELLATION OF YOUR SURGERY. PATIENT SIGNATURE___________________________________________

## 2013-01-06 ENCOUNTER — Encounter (HOSPITAL_COMMUNITY)
Admission: RE | Admit: 2013-01-06 | Discharge: 2013-01-06 | Disposition: A | Discharge status: Home / Self Care | Payer: Medicare Other | Source: Ambulatory Visit | Attending: Orthopedic Surgery | Admitting: Orthopedic Surgery

## 2013-01-06 ENCOUNTER — Encounter (HOSPITAL_COMMUNITY): Payer: Self-pay

## 2013-01-06 DIAGNOSIS — Z01812 Encounter for preprocedural laboratory examination: Secondary | ICD-10-CM | POA: Insufficient documentation

## 2013-01-06 HISTORY — DX: Anxiety disorder, unspecified: F41.9

## 2013-01-06 HISTORY — DX: Thyrotoxicosis, unspecified without thyrotoxic crisis or storm: E05.90

## 2013-01-06 LAB — CBC
MCH: 26.5 pg (ref 26.0–34.0)
MCHC: 31.6 g/dL (ref 30.0–36.0)
Platelets: 206 10*3/uL (ref 150–400)
RBC: 4.52 MIL/uL (ref 3.87–5.11)
RDW: 14.4 % (ref 11.5–15.5)

## 2013-01-06 LAB — SURGICAL PCR SCREEN: MRSA, PCR: NEGATIVE

## 2013-01-06 NOTE — Progress Notes (Signed)
Chest x-ray 09/02/12 on EPIC, 07/08/12 ECHO on EPIC, 09/02/12 abnormal EKG on EPIC, 11/10/12 Stress test on EPIC

## 2013-01-13 ENCOUNTER — Encounter (HOSPITAL_COMMUNITY): Payer: Self-pay | Admitting: *Deleted

## 2013-01-13 ENCOUNTER — Ambulatory Visit (HOSPITAL_COMMUNITY): Payer: Medicare Other | Admitting: Anesthesiology

## 2013-01-13 ENCOUNTER — Encounter (HOSPITAL_COMMUNITY)
Admission: RE | Disposition: A | Discharge status: Home / Self Care | Payer: Self-pay | Source: Ambulatory Visit | Attending: Orthopedic Surgery

## 2013-01-13 ENCOUNTER — Ambulatory Visit (HOSPITAL_COMMUNITY): Payer: Medicare Other

## 2013-01-13 ENCOUNTER — Telehealth: Payer: Self-pay | Admitting: Family

## 2013-01-13 ENCOUNTER — Encounter (HOSPITAL_COMMUNITY): Payer: Self-pay | Admitting: Anesthesiology

## 2013-01-13 ENCOUNTER — Observation Stay (HOSPITAL_COMMUNITY)
Admission: RE | Admit: 2013-01-13 | Discharge: 2013-01-15 | Disposition: A | Discharge status: Home / Self Care | Payer: Medicare Other | Source: Ambulatory Visit | Attending: Orthopedic Surgery | Admitting: Orthopedic Surgery

## 2013-01-13 DIAGNOSIS — Z79899 Other long term (current) drug therapy: Secondary | ICD-10-CM | POA: Insufficient documentation

## 2013-01-13 DIAGNOSIS — J45909 Unspecified asthma, uncomplicated: Secondary | ICD-10-CM | POA: Insufficient documentation

## 2013-01-13 DIAGNOSIS — K219 Gastro-esophageal reflux disease without esophagitis: Secondary | ICD-10-CM | POA: Insufficient documentation

## 2013-01-13 DIAGNOSIS — E0590 Thyrotoxicosis, unspecified without thyrotoxic crisis or storm: Secondary | ICD-10-CM | POA: Insufficient documentation

## 2013-01-13 DIAGNOSIS — M5126 Other intervertebral disc displacement, lumbar region: Principal | ICD-10-CM | POA: Insufficient documentation

## 2013-01-13 DIAGNOSIS — M549 Dorsalgia, unspecified: Secondary | ICD-10-CM

## 2013-01-13 HISTORY — PX: LUMBAR LAMINECTOMY/DECOMPRESSION MICRODISCECTOMY: SHX5026

## 2013-01-13 LAB — POTASSIUM: Potassium: 4.2 meq/L (ref 3.5–5.1)

## 2013-01-13 SURGERY — LUMBAR LAMINECTOMY/DECOMPRESSION MICRODISCECTOMY 1 LEVEL
Anesthesia: General | Laterality: Right | Wound class: Clean

## 2013-01-13 MED ORDER — 0.9 % SODIUM CHLORIDE (POUR BTL) OPTIME
TOPICAL | Status: DC | PRN
  Administered 2013-01-13: 1000 mL

## 2013-01-13 MED ORDER — CEFAZOLIN SODIUM-DEXTROSE 2-3 GM-% IV SOLR
INTRAVENOUS | Status: AC
  Filled 2013-01-13: qty 50

## 2013-01-13 MED ORDER — FOLIC ACID 1 MG PO TABS
1.0000 mg | ORAL_TABLET | Freq: Every day | ORAL | Status: DC
  Administered 2013-01-13 – 2013-01-15 (×3): 1 mg via ORAL
  Filled 2013-01-13 (×3): qty 1

## 2013-01-13 MED ORDER — SODIUM CHLORIDE 0.9 % IJ SOLN: 3.0000 mL | INTRAMUSCULAR | Status: DC | PRN

## 2013-01-13 MED ORDER — LITHIUM CARBONATE ER 300 MG PO TBCR
600.0000 mg | EXTENDED_RELEASE_TABLET | Freq: Every day | ORAL | Status: DC
  Filled 2013-01-13: qty 2

## 2013-01-13 MED ORDER — PROPOFOL 10 MG/ML IV BOLUS
INTRAVENOUS | Status: DC | PRN
  Administered 2013-01-13: 200 mg via INTRAVENOUS

## 2013-01-13 MED ORDER — MENTHOL 3 MG MT LOZG: 1.0000 | LOZENGE | OROMUCOSAL | Status: DC | PRN

## 2013-01-13 MED ORDER — MIDAZOLAM HCL 5 MG/5ML IJ SOLN
INTRAMUSCULAR | Status: DC | PRN
  Administered 2013-01-13: 1 mg via INTRAVENOUS

## 2013-01-13 MED ORDER — LACTATED RINGERS IV SOLN
INTRAVENOUS | Status: DC
  Administered 2013-01-13: 1000 mL via INTRAVENOUS

## 2013-01-13 MED ORDER — QUETIAPINE FUMARATE 400 MG PO TABS
400.0000 mg | ORAL_TABLET | Freq: Every day | ORAL | Status: DC
  Administered 2013-01-13 – 2013-01-14 (×2): 400 mg via ORAL
  Filled 2013-01-13 (×3): qty 1

## 2013-01-13 MED ORDER — ESTRADIOL 1 MG PO TABS
1.0000 mg | ORAL_TABLET | Freq: Every day | ORAL | Status: DC
  Administered 2013-01-13 – 2013-01-15 (×3): 1 mg via ORAL
  Filled 2013-01-13 (×3): qty 1

## 2013-01-13 MED ORDER — ACETAMINOPHEN 10 MG/ML IV SOLN
INTRAVENOUS | Status: DC | PRN
  Administered 2013-01-13: 1000 mg via INTRAVENOUS

## 2013-01-13 MED ORDER — LITHIUM CARBONATE 300 MG PO CAPS
600.0000 mg | ORAL_CAPSULE | Freq: Every day | ORAL | Status: DC
  Administered 2013-01-13 – 2013-01-14 (×2): 600 mg via ORAL
  Filled 2013-01-13 (×3): qty 2

## 2013-01-13 MED ORDER — GLYCOPYRROLATE 0.2 MG/ML IJ SOLN
INTRAMUSCULAR | Status: DC | PRN
  Administered 2013-01-13: 0.6 mg via INTRAVENOUS

## 2013-01-13 MED ORDER — CYANOCOBALAMIN 500 MCG/0.1ML NA SOLN: NASAL | Status: DC

## 2013-01-13 MED ORDER — CISATRACURIUM BESYLATE (PF) 10 MG/5ML IV SOLN
INTRAVENOUS | Status: DC | PRN
  Administered 2013-01-13: 6 mg via INTRAVENOUS
  Administered 2013-01-13 (×2): 2 mg via INTRAVENOUS

## 2013-01-13 MED ORDER — ONDANSETRON HCL 4 MG/2ML IJ SOLN
INTRAMUSCULAR | Status: DC | PRN
  Administered 2013-01-13 (×2): 2 mg via INTRAVENOUS

## 2013-01-13 MED ORDER — POVIDONE-IODINE 7.5 % EX SOLN: Freq: Once | CUTANEOUS | Status: DC

## 2013-01-13 MED ORDER — SODIUM CHLORIDE 0.9 % IJ SOLN
3.0000 mL | Freq: Two times a day (BID) | INTRAMUSCULAR | Status: DC
  Administered 2013-01-14: 3 mL via INTRAVENOUS

## 2013-01-13 MED ORDER — SODIUM CHLORIDE 0.9 % IV SOLN: 250.0000 mL | INTRAVENOUS | Status: DC

## 2013-01-13 MED ORDER — THROMBIN 5000 UNITS EX SOLR
CUTANEOUS | Status: AC
  Filled 2013-01-13: qty 5000

## 2013-01-13 MED ORDER — PHENOL 1.4 % MT LIQD: 1.0000 | OROMUCOSAL | Status: DC | PRN

## 2013-01-13 MED ORDER — SUCCINYLCHOLINE CHLORIDE 20 MG/ML IJ SOLN
INTRAMUSCULAR | Status: DC | PRN
  Administered 2013-01-13: 100 mg via INTRAVENOUS

## 2013-01-13 MED ORDER — THROMBIN 5000 UNITS EX SOLR
CUTANEOUS | Status: DC | PRN
  Administered 2013-01-13: 10000 [IU] via TOPICAL

## 2013-01-13 MED ORDER — KETAMINE HCL 10 MG/ML IJ SOLN
INTRAMUSCULAR | Status: DC | PRN
  Administered 2013-01-13: 10 mg via INTRAVENOUS

## 2013-01-13 MED ORDER — DEXTROSE-NACL 5-0.45 % IV SOLN
INTRAVENOUS | Status: DC
  Administered 2013-01-13 – 2013-01-14 (×2): via INTRAVENOUS

## 2013-01-13 MED ORDER — CEFAZOLIN SODIUM-DEXTROSE 2-3 GM-% IV SOLR
2.0000 g | Freq: Once | INTRAVENOUS | Status: AC
  Administered 2013-01-13: 2 g via INTRAVENOUS

## 2013-01-13 MED ORDER — NEOSTIGMINE METHYLSULFATE 1 MG/ML IJ SOLN
INTRAMUSCULAR | Status: DC | PRN
  Administered 2013-01-13: 4 mg via INTRAVENOUS

## 2013-01-13 MED ORDER — OXYCODONE-ACETAMINOPHEN 5-325 MG PO TABS
1.0000 | ORAL_TABLET | ORAL | Status: DC | PRN
  Administered 2013-01-13: 1 via ORAL
  Administered 2013-01-14 – 2013-01-15 (×6): 2 via ORAL
  Filled 2013-01-13: qty 2
  Filled 2013-01-13: qty 1
  Filled 2013-01-13: qty 2
  Filled 2013-01-13: qty 1
  Filled 2013-01-13 (×4): qty 2

## 2013-01-13 MED ORDER — HYDROMORPHONE HCL PF 1 MG/ML IJ SOLN
0.5000 mg | INTRAMUSCULAR | Status: DC | PRN
  Administered 2013-01-13 (×2): 1 mg via INTRAVENOUS
  Filled 2013-01-13 (×2): qty 1

## 2013-01-13 MED ORDER — ACETAMINOPHEN 10 MG/ML IV SOLN
INTRAVENOUS | Status: AC
  Filled 2013-01-13: qty 100

## 2013-01-13 MED ORDER — ALBUTEROL SULFATE HFA 108 (90 BASE) MCG/ACT IN AERS: 2.0000 | INHALATION_SPRAY | Freq: Four times a day (QID) | RESPIRATORY_TRACT | Status: DC | PRN

## 2013-01-13 MED ORDER — BUPIVACAINE-EPINEPHRINE PF 0.25-1:200000 % IJ SOLN
INTRAMUSCULAR | Status: AC
  Filled 2013-01-13: qty 30

## 2013-01-13 MED ORDER — HEMOSTATIC AGENTS (NO CHARGE) OPTIME
TOPICAL | Status: DC | PRN
  Administered 2013-01-13: 1 via TOPICAL

## 2013-01-13 MED ORDER — LITHIUM CARBONATE 300 MG PO CAPS
300.0000 mg | ORAL_CAPSULE | Freq: Every day | ORAL | Status: DC
  Administered 2013-01-14 – 2013-01-15 (×2): 300 mg via ORAL
  Filled 2013-01-13 (×2): qty 1

## 2013-01-13 MED ORDER — PANTOPRAZOLE SODIUM 40 MG PO TBEC
40.0000 mg | DELAYED_RELEASE_TABLET | Freq: Every day | ORAL | Status: DC
  Administered 2013-01-13 – 2013-01-15 (×3): 40 mg via ORAL
  Filled 2013-01-13 (×3): qty 1

## 2013-01-13 MED ORDER — LITHIUM CARBONATE ER 300 MG PO TBCR: 300.0000 mg | EXTENDED_RELEASE_TABLET | Freq: Every day | ORAL | Status: DC

## 2013-01-13 MED ORDER — FENTANYL CITRATE 0.05 MG/ML IJ SOLN
INTRAMUSCULAR | Status: DC | PRN
  Administered 2013-01-13 (×2): 50 ug via INTRAVENOUS

## 2013-01-13 MED ORDER — HYDROMORPHONE HCL PF 1 MG/ML IJ SOLN
INTRAMUSCULAR | Status: AC
  Filled 2013-01-13: qty 1

## 2013-01-13 MED ORDER — DEXTROSE IN LACTATED RINGERS 5 % IV SOLN: INTRAVENOUS | Status: DC

## 2013-01-13 MED ORDER — LACTATED RINGERS IV SOLN
INTRAVENOUS | Status: DC | PRN
  Administered 2013-01-13 (×2): via INTRAVENOUS

## 2013-01-13 MED ORDER — ONDANSETRON HCL 4 MG/2ML IJ SOLN: 4.0000 mg | INTRAMUSCULAR | Status: DC | PRN

## 2013-01-13 MED ORDER — LITHIUM CARBONATE 300 MG PO TABS: 300.0000 mg | ORAL_TABLET | Freq: Two times a day (BID) | ORAL | Status: DC

## 2013-01-13 MED ORDER — PHENYLEPHRINE HCL 10 MG/ML IJ SOLN
INTRAMUSCULAR | Status: DC | PRN
  Administered 2013-01-13: 120 ug via INTRAVENOUS
  Administered 2013-01-13: 20 ug via INTRAVENOUS
  Administered 2013-01-13: 40 ug via INTRAVENOUS
  Administered 2013-01-13: 20 ug via INTRAVENOUS
  Administered 2013-01-13: 40 ug via INTRAVENOUS

## 2013-01-13 MED ORDER — LACTATED RINGERS IV SOLN: INTRAVENOUS | Status: DC

## 2013-01-13 MED ORDER — CEFAZOLIN SODIUM-DEXTROSE 2-3 GM-% IV SOLR
2.0000 g | Freq: Three times a day (TID) | INTRAVENOUS | Status: AC
  Administered 2013-01-13 – 2013-01-14 (×2): 2 g via INTRAVENOUS
  Filled 2013-01-13 (×2): qty 50

## 2013-01-13 MED ORDER — HYDROMORPHONE HCL PF 1 MG/ML IJ SOLN
0.2500 mg | INTRAMUSCULAR | Status: DC | PRN
  Administered 2013-01-13 (×2): 0.5 mg via INTRAVENOUS

## 2013-01-13 MED ORDER — PROMETHAZINE HCL 25 MG/ML IJ SOLN: 6.2500 mg | INTRAMUSCULAR | Status: DC | PRN

## 2013-01-13 MED ORDER — LIDOCAINE HCL (CARDIAC) 20 MG/ML IV SOLN
INTRAVENOUS | Status: DC | PRN
  Administered 2013-01-13: 20 mg via INTRAVENOUS

## 2013-01-13 SURGICAL SUPPLY — 42 items
APL SKNCLS STERI-STRIP NONHPOA (GAUZE/BANDAGES/DRESSINGS) ×1
BAG SPEC THK2 15X12 ZIP CLS (MISCELLANEOUS) ×1
BAG ZIPLOCK 12X15 (MISCELLANEOUS) ×2 IMPLANT
BENZOIN TINCTURE PRP APPL 2/3 (GAUZE/BANDAGES/DRESSINGS) ×2 IMPLANT
BUR EGG ELITE 4.0 (BURR) ×2 IMPLANT
CLEANER TIP ELECTROSURG 2X2 (MISCELLANEOUS) ×2 IMPLANT
CLOTH BEACON ORANGE TIMEOUT ST (SAFETY) ×2 IMPLANT
CONT SPEC 4OZ CLIKSEAL STRL BL (MISCELLANEOUS) ×2 IMPLANT
DRAIN PENROSE 18X1/4 LTX STRL (WOUND CARE) IMPLANT
DRAPE MICROSCOPE LEICA (MISCELLANEOUS) ×2 IMPLANT
DRAPE POUCH INSTRU U-SHP 10X18 (DRAPES) ×2 IMPLANT
DRAPE SURG 17X11 SM STRL (DRAPES) ×2 IMPLANT
DRSG ADAPTIC 3X8 NADH LF (GAUZE/BANDAGES/DRESSINGS) ×2 IMPLANT
DRSG PAD ABDOMINAL 8X10 ST (GAUZE/BANDAGES/DRESSINGS) ×2 IMPLANT
DURAPREP 26ML APPLICATOR (WOUND CARE) ×2 IMPLANT
ELECT BLADE TIP CTD 4 INCH (ELECTRODE) ×2 IMPLANT
ELECT REM PT RETURN 9FT ADLT (ELECTROSURGICAL) ×2
ELECTRODE REM PT RTRN 9FT ADLT (ELECTROSURGICAL) ×1 IMPLANT
GLOVE BIO SURGEON STRL SZ8 (GLOVE) ×4 IMPLANT
GLOVE ECLIPSE 8.0 STRL XLNG CF (GLOVE) ×2 IMPLANT
GLOVE INDICATOR 8.0 STRL GRN (GLOVE) ×4 IMPLANT
GOWN STRL REIN XL XLG (GOWN DISPOSABLE) ×2 IMPLANT
KIT BASIN OR (CUSTOM PROCEDURE TRAY) ×2 IMPLANT
KIT POSITIONING SURG ANDREWS (MISCELLANEOUS) ×2 IMPLANT
MANIFOLD NEPTUNE II (INSTRUMENTS) ×2 IMPLANT
NEEDLE SPNL 18GX3.5 QUINCKE PK (NEEDLE) ×8 IMPLANT
NS IRRIG 1000ML POUR BTL (IV SOLUTION) ×2 IMPLANT
PATTIES SURGICAL .5 X.5 (GAUZE/BANDAGES/DRESSINGS) IMPLANT
PATTIES SURGICAL .75X.75 (GAUZE/BANDAGES/DRESSINGS) IMPLANT
PATTIES SURGICAL 1X1 (DISPOSABLE) IMPLANT
POSITIONER SURGICAL ARM (MISCELLANEOUS) ×2 IMPLANT
SPONGE GAUZE 4X4 12PLY (GAUZE/BANDAGES/DRESSINGS) ×2 IMPLANT
SPONGE LAP 4X18 X RAY DECT (DISPOSABLE) IMPLANT
SPONGE SURGIFOAM ABS GEL 100 (HEMOSTASIS) ×2 IMPLANT
STAPLER VISISTAT 35W (STAPLE) IMPLANT
SUT VIC AB 1 CT1 27 (SUTURE) ×2
SUT VIC AB 1 CT1 27XBRD ANTBC (SUTURE) ×2 IMPLANT
SUT VIC AB 2-0 CT1 27 (SUTURE) ×2
SUT VIC AB 2-0 CT1 27XBRD (SUTURE) ×2 IMPLANT
TOWEL OR 17X26 10 PK STRL BLUE (TOWEL DISPOSABLE) ×4 IMPLANT
TRAY LAMINECTOMY (CUSTOM PROCEDURE TRAY) ×2 IMPLANT
WATER STERILE IRR 1500ML POUR (IV SOLUTION) ×2 IMPLANT

## 2013-01-13 NOTE — H&P (Signed)
Lynn Gonzalez is an 52 y.o. female.   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 Bipolar affective disorder 1985    ?nervous breakdown  . Thyroid disease     ? overactive thyroid  . Breast tumor   . B12 DEFICIENCY   . ANEMIA   . INTERSTITIAL LUNG DISEASE   . GASTROESOPHAGEAL REFLUX DISEASE   . BREAST CYST, LEFT   . GOITER   . Syncope   . Asthma   . H/O cardiovascular stress test     Lex MV 4/14:  No ischemia, EF 72%, normal wall motion  . Hyperthyroidism   . Anxiety     Past Surgical History  Procedure Laterality Date  . Tubal ligation  1985, 1996  . Gallbladder surgery  1985    stones removed  . Mediastinoscopy  2001  . Sternotomy  2001  . Bunionectomy      bilateral  . Abdominal hysterectomy  06/03/10  . Knee surgery  1987    bilateral  . Tonsillectomy      Family History  Problem Relation Age of Onset  . Diabetes Mother   . Hypertension Mother   . Cancer Father     ?type  . Hypertension Father    Social History:  reports that she has never smoked. She has never used smokeless tobacco. She reports that she does not drink alcohol or use illicit drugs.  Allergies:  Allergies  Allergen Reactions  . Adhesive (Tape) Rash  . Latex Rash    Medications Prior to Admission  Medication Sig Dispense Refill  . albuterol (PROVENTIL HFA;VENTOLIN HFA) 108 (90 BASE) MCG/ACT inhaler Inhale 2 puffs into the lungs every 6 (six) hours as needed for wheezing or shortness of breath.       . estradiol (ESTRACE) 2 MG tablet take 1 tablet by mouth once daily  30 tablet  0  . folic acid (FOLVITE) 1 MG tablet Take 1 tablet (1 mg total) by mouth daily.  30 tablet  11  . ibuprofen (ADVIL,MOTRIN) 800 MG tablet Take 800 mg by mouth every 8 (eight) hours as needed for pain.       Marland Kitchen lithium 300 MG tablet Take 300-600 mg by mouth 2 (two) times daily. Take 1  tablet every morning and take 2 tablets every night at bedtime.      . pantoprazole (PROTONIX) 40 MG tablet take 1 tablet by mouth once daily  30 tablet  3  . QUEtiapine (SEROQUEL) 400 MG tablet Take 400 mg by mouth at bedtime.      . traMADol (ULTRAM) 50 MG tablet Take 1 tablet (50 mg total) by mouth every 8 (eight) hours as needed for pain.  30 tablet  0    Results for orders placed during the hospital encounter of 01/13/13 (from the past 48 hour(s))  POTASSIUM     Status: None   Collection Time    01/13/13 11:05 AM      Result Value Range   Potassium 4.2  3.5 - 5.1 mEq/L   No results found.  ROS  Blood pressure 128/82, pulse 87, temperature 98.1 F (36.7 C), temperature source Oral, resp. rate 18, last menstrual period 04/18/2010, SpO2 99.00%. Physical Exam  Constitutional: She is oriented to person, place, and time. She appears well-developed and well-nourished.  HENT:  Head: Normocephalic and atraumatic.  Right Ear: External  ear normal.  Left Ear: External ear normal.  Nose: Nose normal.  Mouth/Throat: Oropharynx is clear and moist.  Eyes: Conjunctivae and EOM are normal. Pupils are equal, round, and reactive to light.  Neck: Normal range of motion. Neck supple.  Cardiovascular: Normal rate, regular rhythm, normal heart sounds and intact distal pulses.   Respiratory: Effort normal and breath sounds normal.  GI: Bowel sounds are normal.  Musculoskeletal: Normal range of motion.  Neurological: She is alert and oriented to person, place, and time. She has normal reflexes.  Skin: Skin is warm and dry.  Psychiatric: She has a normal mood and affect. Her behavior is normal. Judgment and thought content normal.     Assessment/Plan HNP and foraminal stenosis L4-5 Microdiscectomy L4-5 rt  Abrar Bilton P 01/13/2013, 12:51 PM

## 2013-01-13 NOTE — Telephone Encounter (Signed)
Spoke with pharmacist and he states they never received Rx from April although our system indicates that it confirmed receipt of Rx with pharmacy. Gave verbal to pharmacist. Attempted to notify pt and received message that mailbox is full.

## 2013-01-13 NOTE — Telephone Encounter (Signed)
Called patient to remind her of her appointment for tomorrow but she states that she is in the hospital and is having surgery this afternoon. I cancelled appointment for tomorrow and told patient to call to schedule post hospital follow up once she is discharged.  Also, patient states that the pharmacy never has received the prescription for the b12(she says that you "shoot" this prescription up her nose?)

## 2013-01-13 NOTE — Anesthesia Preprocedure Evaluation (Addendum)
Anesthesia Evaluation  Patient identified by MRN, date of birth, ID band Patient awake    Reviewed: Allergy & Precautions, H&P , NPO status , Patient's Chart, lab work & pertinent test results  Airway Mallampati: II TM Distance: >3 FB Neck ROM: Full    Dental  (+) Dental Advisory Given and Edentulous Upper   Pulmonary neg pulmonary ROS, asthma ,  breath sounds clear to auscultation  Pulmonary exam normal       Cardiovascular negative cardio ROS  Rhythm:Regular Rate:Normal     Neuro/Psych Anxiety negative neurological ROS  negative psych ROS   GI/Hepatic Neg liver ROS, GERD-  Medicated,  Endo/Other  Hyperthyroidism   Renal/GU negative Renal ROS  negative genitourinary   Musculoskeletal negative musculoskeletal ROS (+)   Abdominal   Peds  Hematology negative hematology ROS (+)   Anesthesia Other Findings   Reproductive/Obstetrics                          Anesthesia Physical Anesthesia Plan  ASA: II  Anesthesia Plan: General   Post-op Pain Management:    Induction: Intravenous  Airway Management Planned: Oral ETT  Additional Equipment:   Intra-op Plan:   Post-operative Plan: Extubation in OR  Informed Consent: I have reviewed the patients History and Physical, chart, labs and discussed the procedure including the risks, benefits and alternatives for the proposed anesthesia with the patient or authorized representative who has indicated his/her understanding and acceptance.   Dental advisory given  Plan Discussed with: CRNA  Anesthesia Plan Comments:         Anesthesia Quick Evaluation

## 2013-01-13 NOTE — Anesthesia Postprocedure Evaluation (Signed)
  Anesthesia Post-op Note  Patient: Lynn Gonzalez  Procedure(s) Performed: Procedure(s) (LRB): MICRODISCECTOMY FORAMINOTOMY OF L4-5 ON THE RIGHT (Right)  Patient Location: PACU  Anesthesia Type: General  Level of Consciousness: awake and alert   Airway and Oxygen Therapy: Patient Spontanous Breathing  Post-op Pain: mild  Post-op Assessment: Post-op Vital signs reviewed, Patient's Cardiovascular Status Stable, Respiratory Function Stable, Patent Airway and No signs of Nausea or vomiting  Last Vitals:  Filed Vitals:   01/13/13 1545  BP: 117/76  Pulse: 74  Temp:   Resp: 19    Post-op Vital Signs: stable   Complications: No apparent anesthesia complications

## 2013-01-13 NOTE — Telephone Encounter (Signed)
Pt returned my call and was notified that rx has been completed and to check with pharmacy.

## 2013-01-13 NOTE — Transfer of Care (Signed)
Immediate Anesthesia Transfer of Care Note  Patient: Lynn Gonzalez  Procedure(s) Performed: Procedure(s): MICRODISCECTOMY FORAMINOTOMY OF L4-5 ON THE RIGHT (Right)  Patient Location: PACU  Anesthesia Type:General  Level of Consciousness: sedated  Airway & Oxygen Therapy: Patient Spontanous Breathing and Patient connected to face mask oxygen  Post-op Assessment: Report given to PACU RN and Post -op Vital signs reviewed and stable  Post vital signs: Reviewed and stable  Complications: No apparent anesthesia complications

## 2013-01-14 ENCOUNTER — Ambulatory Visit: Payer: Medicare Other | Admitting: Family

## 2013-01-14 ENCOUNTER — Encounter (HOSPITAL_COMMUNITY): Payer: Self-pay | Admitting: Orthopedic Surgery

## 2013-01-14 NOTE — Evaluation (Signed)
Occupational Therapy Evaluation Patient Details Name: Lynn Gonzalez MRN: [id number] DOB: 1961/03/16 Today's Date: 01/14/2013 Time: 7829-5621 OT Time Calculation (min): 27 min  OT Assessment / Plan / Recommendation Clinical Impression  This 52 year old female was admitted for L4-5 decompression on R.  She needed occasional assistance with LB adls at home and will benefit from skilled OT to increase independence, with supervision level goals in acute    OT Assessment  Patient needs continued OT Services    Follow Up Recommendations  No OT follow up    Barriers to Discharge      Equipment Recommendations  3 in 1 bedside comode    Recommendations for Other Services    Frequency  Min 2X/week    Precautions / Restrictions Precautions Precautions: Back Restrictions Weight Bearing Restrictions: No   Pertinent Vitals/Pain 5/10 back with movement; repositioned in bed    ADL  Grooming: Wash/dry hands;Supervision/safety Where Assessed - Grooming: Supported standing Upper Body Bathing: Set up Where Assessed - Upper Body Bathing: Unsupported sitting Lower Body Bathing: Minimal assistance Where Assessed - Lower Body Bathing: Supported sit to stand Upper Body Dressing: Minimal assistance (lines) Where Assessed - Upper Body Dressing: Unsupported sitting Lower Body Dressing: Moderate assistance (with AE) Where Assessed - Lower Body Dressing: Supported sit to stand Toilet Transfer: Hydrographic surveyor Method: Sit to Barista: Raised toilet seat with arms (or 3-in-1 over toilet) Toileting - Clothing Manipulation and Hygiene: Minimal assistance Where Assessed - Engineer, mining and Hygiene: Sit to stand from 3-in-1 or toilet Equipment Used: Sock aid;Rolling walker;Reacher Transfers/Ambulation Related to ADLs: ambulated to bathroom with min guard; min cues to sidestep through tight space and for sit to stand with back precautions ADL  Comments: Educated on AE kit and  toilet aid.  Plan to issue this to pt.  Pt needs additional practice.      OT Diagnosis: Generalized weakness  OT Problem List: Decreased strength;Decreased activity tolerance;Impaired UE functional use;Decreased knowledge of use of DME or AE;Decreased knowledge of precautions OT Treatment Interventions: Self-care/ADL training;DME and/or AE instruction;Patient/family education   OT Goals Acute Rehab OT Goals OT Goal Formulation: With patient Time For Goal Achievement: 01/21/13 Potential to Achieve Goals: Good ADL Goals Pt Will Perform Lower Body Bathing: with supervision;Sit to stand from bed;with adaptive equipment ADL Goal: Lower Body Bathing - Progress: Goal set today Pt Will Perform Lower Body Dressing: with supervision;Sit to stand from bed;with adaptive equipment ADL Goal: Lower Body Dressing - Progress: Goal set today Pt Will Transfer to Toilet: with supervision;Ambulation;3-in-1 ADL Goal: Toilet Transfer - Progress: Goal set today Pt Will Perform Toileting - Clothing Manipulation: with supervision;Sitting on 3-in-1 or toilet;Standing ADL Goal: Toileting - Clothing Manipulation - Progress: Goal set today Pt Will Perform Toileting - Hygiene: with min assist;Standing at 3-in-1/toilet;with adaptive equipment ADL Goal: Toileting - Hygiene - Progress: Goal set today  Visit Information  Last OT Received On: 01/14/13 Assistance Needed: +1    Subjective Data  Subjective: I was helping a friend move and hurt my back Patient Stated Goal: get back to being able to do everything   Prior Functioning     Home Living:  Plans to go to boyfriend's:  He works 10 hour days Bathroom Shower/Tub: Engineer, manufacturing systems: Standard Additional Comments: will go to boyfriend's home Prior Function Level of Independence: Needs assistance (some help for LB adls recently) Communication Communication: No difficulties         Vision/Perception  Cognition  Cognition Arousal/Alertness: Awake/alert Behavior During Therapy: WFL for tasks assessed/performed Overall Cognitive Status: Within Functional Limits for tasks assessed    Extremity/Trunk Assessment Right Upper Extremity Assessment RUE ROM/Strength/Tone: WFL for tasks assessed Left Upper Extremity Assessment LUE ROM/Strength/Tone: WFL for tasks assessed     Mobility Bed Mobility Bed Mobility: Supine to Sit;Sit to Supine Supine to Sit: 4: Min assist Sit to Supine: 4: Min assist Details for Bed Mobility Assistance: cues for technique Transfers Transfers: Sit to Stand;Stand to Sit Sit to Stand: 4: Min guard Stand to Sit: 4: Min guard Details for Transfer Assistance: vcs for technique/precautions     Exercise     Balance     End of Session OT - End of Session Activity Tolerance: Patient tolerated treatment well Patient left: in bed;with call bell/phone within reach  GO Functional Assessment Tool Used: clinical observation Functional Limitation: Self care Self Care Current Status (M5784): At least 40 percent but less than 60 percent impaired, limited or restricted Self Care Goal Status (O9629): At least 1 percent but less than 20 percent impaired, limited or restricted   Bard Haupert 01/14/2013, 2:16 PM Marica Otter, OTR/L [phone] 01/14/2013

## 2013-01-14 NOTE — Progress Notes (Signed)
Delivered rw and commode to hospital room use.

## 2013-01-14 NOTE — Evaluation (Signed)
Physical Therapy Evaluation Patient Details Name: Lynn Gonzalez MRN: [id number] DOB: May 28, 1961 Today's Date: 01/14/2013 Time: 1102-1140 PT Time Calculation (min): 38 min  PT Assessment / Plan / Recommendation Clinical Impression  Pt s/p decompression laminectomy with microdiscecktomy at L4-5    PT Assessment  Patient needs continued PT services    Follow Up Recommendations  No PT follow up    Does the patient have the potential to tolerate intense rehabilitation      Barriers to Discharge None      Equipment Recommendations  Rolling walker with 5" wheels    Recommendations for Other Services OT consult   Frequency 7X/week    Precautions / Restrictions Precautions Precautions: Back Precaution Booklet Issued: Yes (comment) Restrictions Weight Bearing Restrictions: No   Pertinent Vitals/Pain 5/10; premed      Mobility  Bed Mobility Bed Mobility: Supine to Sit;Sit to Supine Supine to Sit: 4: Min assist Sit to Supine: 4: Min assist Details for Bed Mobility Assistance: cues for technique Transfers Transfers: Sit to Stand;Stand to Sit Sit to Stand: 4: Min guard Stand to Sit: 4: Min guard Details for Transfer Assistance: vcs for technique/precautions Ambulation/Gait Ambulation/Gait Assistance: 4: Min assist Ambulation Distance (Feet): 111 Feet (and 24) Assistive device: Rolling walker Ambulation/Gait Assistance Details: cues for posture, BOS and position from RW Gait Pattern: Step-to pattern;Step-through pattern;Decreased stride length    Exercises     PT Diagnosis: Difficulty walking  PT Problem List: Decreased mobility;Decreased activity tolerance;Decreased balance;Decreased knowledge of use of DME;Decreased knowledge of precautions;Pain PT Treatment Interventions: DME instruction;Gait training;Stair training;Functional mobility training;Therapeutic activities;Patient/family education   PT Goals Acute Rehab PT Goals PT Goal Formulation: With  patient Time For Goal Achievement: 01/20/13 Potential to Achieve Goals: Good Pt will go Supine/Side to Sit: with supervision PT Goal: Supine/Side to Sit - Progress: Goal set today Pt will go Sit to Supine/Side: with supervision PT Goal: Sit to Supine/Side - Progress: Goal set today Pt will go Sit to Stand: with supervision PT Goal: Sit to Stand - Progress: Goal set today Pt will go Stand to Sit: with supervision PT Goal: Stand to Sit - Progress: Goal set today Pt will Ambulate: >150 feet;with supervision;with rolling walker PT Goal: Ambulate - Progress: Goal set today Pt will Go Up / Down Stairs: 6-9 stairs;with min assist;with least restrictive assistive device PT Goal: Up/Down Stairs - Progress: Goal set today  Visit Information  Last PT Received On: 01/14/13 Assistance Needed: +1    Subjective Data  Subjective: I'm a little drunk on the pain medicine Patient Stated Goal: Resume previous lifestyle with decreased pain   Prior Functioning  Home Living Lives With: Significant other Available Help at Discharge: Friend(s) Type of Home: House Home Access: Stairs to enter Secretary/administrator of Steps: 6 Entrance Stairs-Rails: Right Home Layout: One level Bathroom Shower/Tub: Engineer, manufacturing systems: Standard Additional Comments: will go to boyfriend's home Prior Function Level of Independence: Needs assistance (some help for LB adls) Able to Take Stairs?: Yes Communication Communication: No difficulties Dominant Hand: Right    Cognition  Cognition Arousal/Alertness: Awake/alert Behavior During Therapy: WFL for tasks assessed/performed Overall Cognitive Status: Within Functional Limits for tasks assessed    Extremity/Trunk Assessment Right Upper Extremity Assessment RUE ROM/Strength/Tone: [facility] for tasks assessed Left Upper Extremity Assessment LUE ROM/Strength/Tone: [facility] for tasks assessed Right Lower Extremity Assessment RLE ROM/Strength/Tone: [facility] for tasks  assessed Left Lower Extremity Assessment LLE ROM/Strength/Tone: [facility] for tasks assessed   Balance Balance Balance Assessed: Yes Static Sitting  Balance Static Sitting - Balance Support: No upper extremity supported Static Sitting - Level of Assistance: 5: Stand by assistance Static Sitting - Comment/# of Minutes: 2 Static Standing Balance Static Standing - Balance Support: Bilateral upper extremity supported Static Standing - Level of Assistance: 4: Min assist Static Standing - Comment/# of Minutes: 2  End of Session PT - End of Session Activity Tolerance: Patient tolerated treatment well Patient left: in bed;with call bell/phone within reach Nurse Communication: Mobility status  GP Functional Assessment Tool Used: clinical judgement Functional Limitation: Mobility: Walking and moving around Mobility: Walking and Moving Around Current Status (A5409): At least 20 percent but less than 40 percent impaired, limited or restricted Mobility: Walking and Moving Around Goal Status [phone]): At least 1 percent but less than 20 percent impaired, limited or restricted   [facility] 01/14/2013, 5:28 PM

## 2013-01-14 NOTE — Progress Notes (Signed)
Patient ID: Lynn Gonzalez, female   DOB: 11/04/1960, 52 y.o.   MRN: [id number] POD 1--pre-op leg pain virtually gone;she has mainly back pain.  No numbness orweakness in foot/leg. Will need at least another day here.

## 2013-01-14 NOTE — Op Note (Signed)
NAMEZAHRIA, DING NO.:  [id number]  MEDICAL RECORD NO.:  [id number]  LOCATION:  1620                         FACILITY:  [facility]  PHYSICIAN:  Marlowe Kays, M.D.  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  OPERATION:  Decompressive laminectomy with microdiskectomy at L4-5, right.  SURGEON:  Marlowe Kays, M.D.  ASSISTANT:  Jacki Cones, MD.  ANESTHESIA:  General.  PATHOLOGY AND JUSTIFICATION FOR PROCEDURE:  She has had persistent progressive pain in the right low back and right leg, as well as some right groin pain.  She has had a thorough workup including a bone scan, which was negative for fracture and a lumbar MRI performed on December 13, 2012, with mild lateral recess stenosis at multiple levels, but at L4-5 moderate to severe foraminal stenosis, which was also accompanied by foraminal disk herniation.  PROCEDURE IN DETAIL:  Prophylactic antibiotics, satisfied general anesthesia, knee-chest position on the [place] frame.  Back was prepped with DuraPrep, draped in sterile field.  Lateral x-ray with 2 spinal needles demonstrated that we were at L4-5.  Time-out was performed.  I then continued draping the back in a sterile field.  Made midline incision based on the initial x-ray.  Incision was carried down to the underlying spinous processes which I thought were L4 and L5, and I tagged them with Kocher clamps and lateral x-ray confirmed the anatomy. We also located for [place] the L4-5 disk.  I then continued the exposure removing the soft tissue off the lamina from L4 to below L5 on the right and partially on the  left, enough so we could put in the [place] retractors.  With curette and double-action rongeur, I removed soft tissue off the lamina of L4 and L5 and then began the decompression after bringing the microscope.  It soon became apparent that things were jammed together at L4-5 and for better exposure I removed a portion of the interspinous ligament and some bone at L4-5.  Then with a combination of double-action rongeur, and two 3-mm Kerr[place] rongeurs I began decompression at L4-5.  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  After removing ligamentum flavum and completed what we felt was adequate exposure.  We took a third x-ray, indicating that we were at the appropriate level and confirming the presence of the location of the L4-5 disk.  This was done with a needle. I then opened the disk, which was flap with 15 knife blade and using a combination of Epstein curette and nerve hook and pituitary rongeurs, we removed moderate amount of disk material including several fragments. When we had removed all the disk material obtained, I then used a hockey- stick and checked the foramen distally and proximally and bolts were widely patent.  The L5 nerve  root, which we had protected throughout was freely mobile.  I then irrigated the wound well with sterile saline and placed Gelfoam soaked in thrombin over the interspace and dura.  I then removed the self-retaining retractors  and began wound closure with interrupted #1 Vicryl in the fascia and leaving 1-2 cm open distally for egress of fluids.  Subcutaneous tissue was closed with a combination of #1 and 2-0 Vicryl, staples on the skin.  Betadine, Adaptic, dry sterile dressing were applied.  She was gently moved onto her PACU bed, and taken in satisfactory condition with no known complications, and minimal blood loss.           ______________________________ Marlowe Kays, M.D.     JA/MEDQ  D:  01/13/2013  T:  01/14/2013  Job:  [id number]

## 2013-01-15 MED ORDER — OXYCODONE-ACETAMINOPHEN 5-325 MG PO TABS: 1.0000 | ORAL_TABLET | ORAL | Status: DC | PRN

## 2013-01-15 NOTE — Progress Notes (Signed)
Subjective: 2 Days Post-Op Procedure(s) (LRB): MICRODISCECTOMY FORAMINOTOMY OF L4-5 ON THE RIGHT (Right) Patient reports pain as mild.  Well controlled with oral pain meds.  Denies numbness, tingling or weakness in LEs.  Objective: Vital signs in last 24 hours: Temp:  [98.6 F (37 C)-99.4 F (37.4 C)] 99.4 F (37.4 C) (05/31 0500) Pulse Rate:  [89-98] 98 (05/31 0500) Resp:  [16-18] 16 (05/31 0500) BP: (113-118)/(75-83) 114/75 mmHg (05/31 0500) SpO2:  [95 %-97 %] 97 % (05/31 0500)  Intake/Output from previous day: 05/30 0701 - 05/31 0700 In: 960 [P.O.:960] Out: 1750 [Urine:1750] Intake/Output this shift: Total I/O In: -  Out: 250 [Urine:250]  No results found for this basename: HGB,  in the last 72 hours No results found for this basename: WBC, RBC, HCT, PLT,  in the last 72 hours  Recent Labs  01/13/13 1105  K 4.2   No results found for this basename: LABPT, INR,  in the last 72 hours  Lumbar spine wound dressed and dry.  NVI at B LEs.  Assessment/Plan: 2 Days Post-Op Procedure(s) (LRB): MICRODISCECTOMY FORAMINOTOMY OF L4-5 ON THE RIGHT (Right) Discharge home with home health  Toni Arthurs 01/15/2013, 9:45 AM

## 2013-01-15 NOTE — Progress Notes (Signed)
Occupational Therapy Treatment Patient Details Name: Lynn Gonzalez Vessel MRN: [id number] DOB: 06-19-1961 Today's Date: 01/15/2013 Time: 1331-1400 OT Time Calculation (min): 29 min  OT Assessment / Plan / Recommendation Comments on Treatment Session This 52 yo female s/p back surgery (not fusion) presents to acute OT making progress however grogginess of meds slowing this process down. All education completed. Acute OT will sign off and pt D/C'ing home today.    Follow Up Recommendations  No OT follow up       Equipment Recommendations  3 in 1 bedside comode       Frequency Min 2X/week   Plan Discharge plan remains appropriate    Precautions / Restrictions Precautions Precautions: Back Restrictions Weight Bearing Restrictions: No   Pertinent Vitals/Pain 6/10 --informed nurse however pt rather groggy and agreed to wait until she got home to take more meds.    ADL  Toilet Transfer: Research scientist (life sciences) Method: Sit to Barista: Raised toilet seat with arms (or 3-in-1 over toilet) Toileting - Clothing Manipulation and Hygiene: Performed;Supervision/safety Where Assessed - Engineer, mining and Hygiene: Standing Equipment Used: Rolling walker;Sock aid;Reacher ADL Comments: Pt doffed socks with sock aid with Min A and min VCs, pt able to show me how she would don socks with sock aide, pt total A to don slim fitting bedroom shoes. Went over with pt how to use toliet aid for back peri care and to use wet wipes nott toliet paper for this to make ease of cleaning easier.      OT Goals ADL Goals ADL Goal: Lower Body Dressing - Progress: Progressing toward goals ADL Goal: Toilet Transfer - Progress: Met ADL Goal: Toileting - Clothing Manipulation - Progress: Met ADL Goal: Toileting - Hygiene - Progress: Met  Visit Information  Last OT Received On: 01/15/13 Assistance Needed: +1          Cognition   Cognition Arousal/Alertness:  (awake/alert beginning-lethargic end (meds)) Behavior During Therapy: Flat affect Overall Cognitive Status: Impaired/Different from baseline Area of Impairment: Attention Current Attention Level: Sustained General Comments: Due to meds    Mobility  Bed Mobility Details for Bed Mobility Assistance: Pt up in recliner upon arrival Transfers Transfers: Sit to Stand;Stand to Sit Sit to Stand: 5: Supervision;With upper extremity assist;With armrests;From chair/3-in-1 Stand to Sit: 5: Supervision;With upper extremity assist;With armrests;To chair/3-in-1 Details for Transfer Assistance: VCs for safe hand placement          End of Session OT - End of Session Activity Tolerance: Patient tolerated treatment well Patient left: in chair;with family/visitor present;with nursing in room       Evette Georges [phone] 01/15/2013, 3:26 PM

## 2013-01-15 NOTE — Progress Notes (Signed)
Physical Therapy Treatment Patient Details Name: Lynn Gonzalez MRN: [id number] DOB: 08-14-1961 Today's Date: 01/15/2013 Time: 4098-1191 PT Time Calculation (min): 34 min  PT Assessment / Plan / Recommendation Comments on Treatment Session  Pt intermittantly lethargic and with difficulty retaining information - ?MEDS    Follow Up Recommendations  No PT follow up     Does the patient have the potential to tolerate intense rehabilitation     Barriers to Discharge        Equipment Recommendations  Rolling walker with 5" wheels    Recommendations for Other Services OT consult  Frequency 7X/week   Plan Discharge plan remains appropriate    Precautions / Restrictions Precautions Precautions: Back Restrictions Weight Bearing Restrictions: No   Pertinent Vitals/Pain 4/10 with activity: premed    Mobility  Bed Mobility Bed Mobility: Supine to Sit;Sit to Supine Supine to Sit: 4: Min assist Sit to Supine: 4: Min assist Details for Bed Mobility Assistance: cues for technique Transfers Transfers: Sit to Stand;Stand to Sit Sit to Stand: 4: Min guard Stand to Sit: 4: Min guard Details for Transfer Assistance: vcs for technique/precautions Ambulation/Gait Ambulation/Gait Assistance: 4: Min guard Ambulation Distance (Feet): 147 Feet Assistive device: Rolling walker Ambulation/Gait Assistance Details: cues for posture, position from RW and stride length Gait Pattern: Step-to pattern;Step-through pattern;Decreased stride length Stairs: Yes Stairs Assistance: 4: Min assist Stairs Assistance Details (indicate cue type and reason): cues for sequence Stair Management Technique: One rail Right;Forwards;Step to pattern Number of Stairs: 2    Exercises     PT Diagnosis:    PT Problem List:   PT Treatment Interventions:     PT Goals Acute Rehab PT Goals PT Goal Formulation: With patient Time For Goal Achievement: 01/20/13 Potential to Achieve Goals: Good Pt will go  Supine/Side to Sit: with supervision PT Goal: Supine/Side to Sit - Progress: Progressing toward goal Pt will go Sit to Supine/Side: with supervision PT Goal: Sit to Supine/Side - Progress: Progressing toward goal Pt will go Sit to Stand: with supervision PT Goal: Sit to Stand - Progress: Progressing toward goal Pt will go Stand to Sit: with supervision PT Goal: Stand to Sit - Progress: Progressing toward goal Pt will Ambulate: >150 feet;with supervision;with rolling walker PT Goal: Ambulate - Progress: Progressing toward goal Pt will Go Up / Down Stairs: 6-9 stairs;with min assist;with least restrictive assistive device PT Goal: Up/Down Stairs - Progress: Progressing toward goal  Visit Information  Last PT Received On: 01/15/13 Assistance Needed: +1    Subjective Data  Subjective: I'm a little drunk on the pain medicine Patient Stated Goal: Resume previous lifestyle with decreased pain   Cognition  Cognition Arousal/Alertness: Suspect due to medications Behavior During Therapy: WFL for tasks assessed/performed Overall Cognitive Status: Within Functional Limits for tasks assessed    Balance  Static Sitting Balance Static Sitting - Balance Support: No upper extremity supported Static Sitting - Level of Assistance: 5: Stand by assistance Static Sitting - Comment/# of Minutes: 2 Static Standing Balance Static Standing - Balance Support: Bilateral upper extremity supported Static Standing - Level of Assistance: 4: Min assist Static Standing - Comment/# of Minutes: 2  End of Session PT - End of Session Activity Tolerance: Treatment limited secondary to medication Patient left: in chair;with call bell/phone within reach Nurse Communication: Mobility status   GP     Reuven Braver 01/15/2013, 1:11 PM

## 2013-01-19 NOTE — Discharge Summary (Signed)
Lynn Gonzalez, Lynn Gonzalez NO.:  [id number]  MEDICAL RECORD NO.:  [id number]  LOCATION:  1620                         FACILITY:  [facility]  PHYSICIAN:  Marlowe Kays, M.D.  DATE OF BIRTH:  1961/02/28  DATE OF ADMISSION:  01/13/2013 DATE OF DISCHARGE:  01/15/2013                              DISCHARGE SUMMARY   ADMITTING DIAGNOSES:  Low back and right leg pain secondary to foraminal disk herniation and lateral recess stenosis at L4-5, right.  DISCHARGE DIAGNOSES:  Low back and right leg pain secondary to foraminal disk herniation and lateral recess stenosis at L4-5, right.  OPERATION:  Decompressive laminectomy and microdiskectomy at L4-5 right on Jan 13, 2013.  SUMMARY:  This 52 year old female was admitted for severe unrelenting pain in her right leg.  Workup has included an MRI and also an ESI as well as medication without relief of her symptomatology.  HOSPITAL COURSE:  The above-mentioned surgery was performed without complication.  Postoperatively, her right leg pain was almost relieved by the time of discharge.  She was ambulatory, afebrile, the wound healing nicely.  She was given postoperative instructions.  Percocet 5/325 for pain to return to my office 2 weeks from surgery. Postoperative back instructions were given.  Condition at discharge was stable and improved.          ______________________________ Marlowe Kays, M.D.     JA/MEDQ  D:  01/18/2013  T:  01/19/2013  Job:  [id number]

## 2013-03-21 ENCOUNTER — Ambulatory Visit: Payer: Medicare Other | Admitting: Family

## 2013-04-15 ENCOUNTER — Telehealth: Payer: Self-pay | Admitting: *Deleted

## 2013-04-15 ENCOUNTER — Ambulatory Visit: Payer: Medicare Other | Admitting: Family

## 2013-04-15 NOTE — Telephone Encounter (Signed)
Office Message [address] [place], [place] [address] p. [phone] f. [phone] To: Lorrin Mais Fax: [phone] From: [facility] Date/ Time: 04/14/2013 10:24 PM Taken By: Patria Mane, CSR Caller: Ivar Drape Facility: not collected Patient: Lynn Gonzalez DOB: September 28, 1960 Phone: [phone] Reason for Call: See info below Regarding Appointment: Yes Appt Date: 04/15/2013 Appt Time: 10:45:00 AM Provider: Sandford Craze (Adults only) Reason: Cancel Appointment Details: Pt calling to cancel appt for tomorrow. will call back to re schedule. Outcome: Cancelled appointment in [facility])

## 2013-04-27 ENCOUNTER — Ambulatory Visit (INDEPENDENT_AMBULATORY_CARE_PROVIDER_SITE_OTHER): Payer: Medicare Other | Admitting: Family

## 2013-04-27 ENCOUNTER — Encounter: Payer: Self-pay | Admitting: Family

## 2013-04-27 VITALS — BP 140/100 | HR 90 | Temp 98.0°F | Resp 16 | Wt 181.0 lb

## 2013-04-27 DIAGNOSIS — L509 Urticaria, unspecified: Secondary | ICD-10-CM | POA: Insufficient documentation

## 2013-04-27 DIAGNOSIS — E538 Deficiency of other specified B group vitamins: Secondary | ICD-10-CM

## 2013-04-27 DIAGNOSIS — Z5181 Encounter for therapeutic drug level monitoring: Secondary | ICD-10-CM

## 2013-04-27 DIAGNOSIS — R21 Rash and other nonspecific skin eruption: Secondary | ICD-10-CM

## 2013-04-27 MED ORDER — CYANOCOBALAMIN 1000 MCG/ML IJ SOLN
1000.0000 ug | Freq: Once | INTRAMUSCULAR | Status: AC
  Administered 2013-04-27: 1000 ug via INTRAMUSCULAR

## 2013-04-27 MED ORDER — CEPHALEXIN 500 MG PO CAPS: 500.0000 mg | ORAL_CAPSULE | Freq: Three times a day (TID) | ORAL | Status: DC

## 2013-04-27 MED ORDER — PREDNISONE 10 MG PO TABS: ORAL_TABLET | ORAL | Status: DC

## 2013-04-27 MED ORDER — OXYCODONE-ACETAMINOPHEN 5-325 MG PO TABS: 1.0000 | ORAL_TABLET | ORAL | Status: DC | PRN

## 2013-04-27 NOTE — Assessment & Plan Note (Signed)
Recommended trial of prednisone taper, prn benadryl. Will add keflex for possible associated superinfection of rash due to excoriation.  Rash and plan was also reviewed with Dr. Blyth.  

## 2013-04-27 NOTE — Progress Notes (Signed)
Subjective:    Patient ID: Lynn Gonzalez, female    DOB: Nov 09, 1960, 52 y.o.   MRN: [id number]  HPI  Lynn Gonzalez is a 52 yr old female who presents today with several concerns.  1) Rash- rash started last week but in the last 2 days she developed tender rash.  She denies associated tongue/lip swelling, or wheezing. Denies known food allerigies.    2) 1 month hx of low back pain.  Radiates down the left leg. She has previous hx or similar symptoms on the right prior to her laminectomy which was performed by Dr. Leslee Home.  3) B12 deficiency- she wishes to restart the b12 injection.    Review of Systems    see HPI  Past Medical History  Diagnosis Date  . Bipolar affective disorder 1985    ?nervous breakdown  . Thyroid disease     ? overactive thyroid  . Breast tumor   . B12 DEFICIENCY   . ANEMIA   . INTERSTITIAL LUNG DISEASE   . GASTROESOPHAGEAL REFLUX DISEASE   . BREAST CYST, LEFT   . GOITER   . Syncope   . Asthma   . H/O cardiovascular stress test     Lex MV 4/14:  No ischemia, EF 72%, normal wall motion  . Hyperthyroidism   . Anxiety     History   Social History  . Marital Status: Single    Spouse Name: N/A    Number of Children: 2  . Years of Education: N/A   Occupational History  .      Disability   Social History Main Topics  . Smoking status: Never Smoker   . Smokeless tobacco: Never Used  . Alcohol Use: No  . Drug Use: No  . Sexual Activity: Yes    Birth Control/ Protection: None   Other Topics Concern  . Not on file   Social History Narrative   single   Regular exercise:  Yes                Past Surgical History  Procedure Laterality Date  . Tubal ligation  1985, 1996  . Gallbladder surgery  1985    stones removed  . Mediastinoscopy  2001  . Sternotomy  2001  . Bunionectomy      bilateral  . Abdominal hysterectomy  06/03/10  . Knee surgery  1987    bilateral  . Tonsillectomy    . Lumbar laminectomy/decompression  microdiscectomy Right 01/13/2013    Procedure: MICRODISCECTOMY FORAMINOTOMY OF L4-5 ON THE RIGHT;  Surgeon: Drucilla Schmidt, MD;  Location: [facility];  Service: Orthopedics;  Laterality: Right;    Family History  Problem Relation Age of Onset  . Diabetes Mother   . Hypertension Mother   . Cancer Father     ?type  . Hypertension Father     Allergies  Allergen Reactions  . Adhesive [Tape] Rash  . Latex Rash    Current Outpatient Prescriptions on File Prior to Visit  Medication Sig Dispense Refill  . albuterol (PROVENTIL HFA;VENTOLIN HFA) 108 (90 BASE) MCG/ACT inhaler Inhale 2 puffs into the lungs every 6 (six) hours as needed for wheezing or shortness of breath.       . estradiol (ESTRACE) 2 MG tablet take 1 tablet by mouth once daily  30 tablet  0  . folic acid (FOLVITE) 1 MG tablet Take 1 tablet (1 mg total) by mouth daily.  30 tablet  11  . lithium 300 MG tablet  Take 300-600 mg by mouth 2 (two) times daily. Take 1 tablet every morning and take 2 tablets every night at bedtime.      . pantoprazole (PROTONIX) 40 MG tablet take 1 tablet by mouth once daily  30 tablet  3  . QUEtiapine (SEROQUEL) 400 MG tablet Take 400 mg by mouth at bedtime.      . Cyanocobalamin 500 MCG/0.1ML SOLN (1 spray) in one nostril once weekly, 1 hr before or 1 hr after ingestion of hot foods/liquids.  1 Bottle  1  . ibuprofen (ADVIL,MOTRIN) 800 MG tablet Take 800 mg by mouth every 8 (eight) hours as needed for pain.       Marland Kitchen oxyCODONE-acetaminophen (PERCOCET/ROXICET) 5-325 MG per tablet Take 1-2 tablets by mouth every 4 (four) hours as needed for pain.  50 tablet  0   No current facility-administered medications on file prior to visit.    Pulse 90  Temp(Src) 98 F (36.7 C) (Oral)  Resp 16  Wt 181 lb (82.101 kg)  BMI 29.23 kg/m2  SpO2 99%  LMP 04/18/2010    Objective:   Physical Exam  Constitutional: She is oriented to person, place, and time. She appears well-developed and well-nourished. No  distress.  HENT:  Head: Normocephalic and atraumatic.  No tongue or lip swelling is noted  Cardiovascular: Normal rate and regular rhythm.   No murmur heard. Pulmonary/Chest: Effort normal and breath sounds normal. No respiratory distress. She has no wheezes. She has no rales. She exhibits no tenderness.  Musculoskeletal: She exhibits no edema.  Neurological: She is alert and oriented to person, place, and time.  Skin: Skin is warm and dry.     3 areas of firm tender erythematous induration. Areas are warm to touch  Psychiatric: She has a normal mood and affect. Her behavior is normal. Judgment and thought content normal.          Assessment & Plan:

## 2013-04-27 NOTE — Assessment & Plan Note (Deleted)
Recommended trial of prednisone taper, prn benadryl. Will add keflex for possible associated superinfection of rash due to excoriation.  Rash and plan was also reviewed with Dr. Abner Greenspan.

## 2013-04-27 NOTE — Patient Instructions (Addendum)
Start prednisone for your back pain and rash. Call if your pain worsens, if rash worsens, or if symptoms are not resolved in 1 week.

## 2013-04-27 NOTE — Assessment & Plan Note (Signed)
Resume b12 injections.

## 2013-05-04 ENCOUNTER — Encounter: Payer: Self-pay | Admitting: Family

## 2013-05-04 ENCOUNTER — Ambulatory Visit (INDEPENDENT_AMBULATORY_CARE_PROVIDER_SITE_OTHER): Payer: Medicare Other | Admitting: Family

## 2013-05-04 VITALS — BP 124/86 | HR 81 | Temp 98.3°F | Resp 18 | Ht 65.0 in | Wt 181.0 lb

## 2013-05-04 DIAGNOSIS — M79604 Pain in right leg: Secondary | ICD-10-CM

## 2013-05-04 DIAGNOSIS — M519 Unspecified thoracic, thoracolumbar and lumbosacral intervertebral disc disorder: Secondary | ICD-10-CM

## 2013-05-04 DIAGNOSIS — M545 Low back pain, unspecified: Secondary | ICD-10-CM

## 2013-05-04 DIAGNOSIS — R21 Rash and other nonspecific skin eruption: Secondary | ICD-10-CM

## 2013-05-04 MED ORDER — ESTRADIOL 2 MG PO TABS: 2.0000 mg | ORAL_TABLET | Freq: Every day | ORAL | Status: DC

## 2013-05-04 NOTE — Patient Instructions (Addendum)
You will be contacted about your referral to Dr. Alveda Reasons.  Please let us know if you have not heard back within 1 week about your referral. Call if itching worsens or if it does not continue to improve.

## 2013-05-04 NOTE — Assessment & Plan Note (Signed)
Unchanged despite steroids. Will refer to Dr. Alveda Reasons as Dr. Simonne Come, her former surgeon has retired.

## 2013-05-04 NOTE — Progress Notes (Signed)
Subjective:    Patient ID: Lynn Gonzalez, female    DOB: Nov 23, 1960, 53 y.o.   MRN: [id number]  HPI  Lynn Gonzalez is a 52 yr old female who presents today for follow up.  1) Rash- Last visit she was treated for an urticarial rash with prednisone and keflex for possible superimposed skin infection. She notes that rash has improved but she still has some itching.   2) Back/Hip pain- She reports pain is located on the right lower back and radiates down the right leg.    She reports that the pain is unchanged despite completion of prednisone taper.    Review of Systems    see HPI  Past Medical History  Diagnosis Date  . Bipolar affective disorder 1985    ?nervous breakdown  . Thyroid disease     ? overactive thyroid  . Breast tumor   . B12 DEFICIENCY   . ANEMIA   . INTERSTITIAL LUNG DISEASE   . GASTROESOPHAGEAL REFLUX DISEASE   . BREAST CYST, LEFT   . GOITER   . Syncope   . Asthma   . H/O cardiovascular stress test     Lex MV 4/14:  No ischemia, EF 72%, normal wall motion  . Hyperthyroidism   . Anxiety     History   Social History  . Marital Status: Single    Spouse Name: N/A    Number of Children: 2  . Years of Education: N/A   Occupational History  .      Disability   Social History Main Topics  . Smoking status: Never Smoker   . Smokeless tobacco: Never Used  . Alcohol Use: No  . Drug Use: No  . Sexual Activity: Yes    Birth Control/ Protection: None   Other Topics Concern  . Not on file   Social History Narrative   single   Regular exercise:  Yes                Past Surgical History  Procedure Laterality Date  . Tubal ligation  1985, 1996  . Gallbladder surgery  1985    stones removed  . Mediastinoscopy  2001  . Sternotomy  2001  . Bunionectomy      bilateral  . Abdominal hysterectomy  06/03/10  . Knee surgery  1987    bilateral  . Tonsillectomy    . Lumbar laminectomy/decompression microdiscectomy Right 01/13/2013    Procedure:  MICRODISCECTOMY FORAMINOTOMY OF L4-5 ON THE RIGHT;  Surgeon: Drucilla Schmidt, MD;  Location: [facility];  Service: Orthopedics;  Laterality: Right;    Family History  Problem Relation Age of Onset  . Diabetes Mother   . Hypertension Mother   . Cancer Father     ?type  . Hypertension Father     Allergies  Allergen Reactions  . Adhesive [Tape] Rash  . Latex Rash    Current Outpatient Prescriptions on File Prior to Visit  Medication Sig Dispense Refill  . albuterol (PROVENTIL HFA;VENTOLIN HFA) 108 (90 BASE) MCG/ACT inhaler Inhale 2 puffs into the lungs every 6 (six) hours as needed for wheezing or shortness of breath.       . cephALEXin (KEFLEX) 500 MG capsule Take 1 capsule (500 mg total) by mouth 3 (three) times daily.  21 capsule  0  . Cyanocobalamin 500 MCG/0.1ML SOLN (1 spray) in one nostril once weekly, 1 hr before or 1 hr after ingestion of hot foods/liquids.  1 Bottle  1  .  folic acid (FOLVITE) 1 MG tablet Take 1 tablet (1 mg total) by mouth daily.  30 tablet  11  . ibuprofen (ADVIL,MOTRIN) 800 MG tablet Take 800 mg by mouth every 8 (eight) hours as needed for pain.       Marland Kitchen lithium 300 MG tablet Take 300-600 mg by mouth 2 (two) times daily. Take 1 tablet every morning and take 2 tablets every night at bedtime.      Marland Kitchen oxyCODONE-acetaminophen (PERCOCET/ROXICET) 5-325 MG per tablet Take 1-2 tablets by mouth every 4 (four) hours as needed for pain.  50 tablet  0  . pantoprazole (PROTONIX) 40 MG tablet take 1 tablet by mouth once daily  30 tablet  3  . QUEtiapine (SEROQUEL) 400 MG tablet Take 400 mg by mouth at bedtime.       No current facility-administered medications on file prior to visit.    BP 124/86  Pulse 81  Temp(Src) 98.3 F (36.8 C) (Oral)  Resp 18  Ht 5\' 5"  (1.651 m)  Wt 181 lb (82.101 kg)  BMI 30.12 kg/m2  SpO2 98%  LMP 04/18/2010     Objective:   Physical Exam  Constitutional: She is oriented to person, place, and time. She appears well-developed and  well-nourished. No distress.  Cardiovascular: Normal rate and regular rhythm.   No murmur heard. Pulmonary/Chest: Effort normal and breath sounds normal. No respiratory distress. She has no wheezes. She has no rales. She exhibits no tenderness.  Musculoskeletal: She exhibits no edema.  Neurological: She is alert and oriented to person, place, and time.  Bilateral LE strength is 5/5  Skin:  Resolution or urticarial rash is noted. Some excoriation is noted.            Assessment & Plan:

## 2013-05-04 NOTE — Assessment & Plan Note (Signed)
Resolving. Recommended hydrocortisone cream and benadryl prn.

## 2013-05-05 ENCOUNTER — Telehealth: Payer: Self-pay | Admitting: *Deleted

## 2013-05-05 DIAGNOSIS — L500 Allergic urticaria: Secondary | ICD-10-CM

## 2013-05-05 NOTE — Telephone Encounter (Signed)
Pt called requesting Rx (?cream) to help with her rash and itching.  Please advise.

## 2013-05-05 NOTE — Telephone Encounter (Signed)
I recommend otc hydrocortisone.

## 2013-05-06 NOTE — Telephone Encounter (Signed)
Notified pt. She states she has not tried hydrocortisone yet but will get it today. Also has only taken 1 benadryl. Pt reports that rash is back on her wrists and now on both ankles. Noted a "whelp" that developed on her chest last night. Advised pt to try the above measures on a consistent basis and let us know if symptoms are not relieved.  Please advise if there are further instructions?

## 2013-05-06 NOTE — Telephone Encounter (Signed)
Notified pt and she voices understanding and is agreeable to proceed with referral. 

## 2013-05-06 NOTE — Telephone Encounter (Signed)
I am also going to make a referral to an allergist.  She should go to the ER if she develops associated tongue/lip swelling or shortness of breath.

## 2013-06-21 ENCOUNTER — Encounter: Payer: Self-pay | Admitting: Family

## 2013-06-21 ENCOUNTER — Ambulatory Visit (INDEPENDENT_AMBULATORY_CARE_PROVIDER_SITE_OTHER): Payer: Medicare Other | Admitting: Family

## 2013-06-21 VITALS — BP 126/88 | HR 90 | Temp 98.0°F | Resp 16 | Ht 65.0 in | Wt 177.1 lb

## 2013-06-21 DIAGNOSIS — M25552 Pain in left hip: Secondary | ICD-10-CM

## 2013-06-21 DIAGNOSIS — M109 Gout, unspecified: Secondary | ICD-10-CM

## 2013-06-21 DIAGNOSIS — E538 Deficiency of other specified B group vitamins: Secondary | ICD-10-CM

## 2013-06-21 DIAGNOSIS — M25559 Pain in unspecified hip: Secondary | ICD-10-CM

## 2013-06-21 MED ORDER — PANTOPRAZOLE SODIUM 40 MG PO TBEC: 40.0000 mg | DELAYED_RELEASE_TABLET | Freq: Every day | ORAL | Status: DC

## 2013-06-21 MED ORDER — OXYCODONE-ACETAMINOPHEN 5-325 MG PO TABS: 1.0000 | ORAL_TABLET | ORAL | Status: DC | PRN

## 2013-06-21 MED ORDER — CYANOCOBALAMIN 1000 MCG/ML IJ SOLN
1000.0000 ug | Freq: Once | INTRAMUSCULAR | Status: AC
  Administered 2013-06-21: 1000 ug via INTRAMUSCULAR

## 2013-06-21 MED ORDER — COLCHICINE 0.6 MG PO TABS: ORAL_TABLET | ORAL | Status: DC

## 2013-06-21 NOTE — Progress Notes (Signed)
Subjective:    Patient ID: Lynn Gonzalez, female    DOB: [birth year], 52 y.o.   MRN: [id number]  HPI  Lynn Gonzalez is a 52 yr old female who presents today with chief complaint of right foot pain.  Pain started 1 month ago.  Pain is constant. Pain is located at the base of the left toe and also at the top of the foot. Has previous hx of elevated uric acid level and gout pain.  Pain is not improved with massage.  Certain shoes cause more pain than others. She has not tried any medication.  Denies known injury or similar pain in foot in the past.   She is requesting refill of the oxycodone for L hip pain.  Reports that ortho has told her that she needs a hip replacement. She has a balance with ortho and has to wait to schedule a follow up appointment. Has tried tramadol and hydrocodone without relief of pain. Using oxycodone sparingly for pain.    Review of Systems Past Medical History  Diagnosis Date  . Bipolar affective disorder 1985    ?nervous breakdown  . Thyroid disease     ? overactive thyroid  . Breast tumor   . B12 DEFICIENCY   . ANEMIA   . INTERSTITIAL LUNG DISEASE   . GASTROESOPHAGEAL REFLUX DISEASE   . BREAST CYST, LEFT   . GOITER   . Syncope   . Asthma   . H/O cardiovascular stress test     Lex MV 4/14:  No ischemia, EF 72%, normal wall motion  . Hyperthyroidism   . Anxiety     History   Social History  . Marital Status: Single    Spouse Name: N/A    Number of Children: 2  . Years of Education: N/A   Occupational History  .      Disability   Social History Main Topics  . Smoking status: Never Smoker   . Smokeless tobacco: Never Used  . Alcohol Use: No  . Drug Use: No  . Sexual Activity: Yes    Birth Control/ Protection: None   Other Topics Concern  . Not on file   Social History Narrative   single   Regular exercise:  Yes                Past Surgical History  Procedure Laterality Date  . Tubal ligation  1985, 1996  . Gallbladder  surgery  1985    stones removed  . Mediastinoscopy  2001  . Sternotomy  2001  . Bunionectomy      bilateral  . Abdominal hysterectomy  06/03/10  . Knee surgery  1987    bilateral  . Tonsillectomy    . Lumbar laminectomy/decompression microdiscectomy Right 01/13/2013    Procedure: MICRODISCECTOMY FORAMINOTOMY OF L4-5 ON THE RIGHT;  Surgeon: Drucilla Schmidt, MD;  Location: [facility];  Service: Orthopedics;  Laterality: Right;    Family History  Problem Relation Age of Onset  . Diabetes Mother   . Hypertension Mother   . Cancer Father     ?type  . Hypertension Father     Allergies  Allergen Reactions  . Adhesive [Tape] Rash  . Latex Rash    Current Outpatient Prescriptions on File Prior to Visit  Medication Sig Dispense Refill  . albuterol (PROVENTIL HFA;VENTOLIN HFA) 108 (90 BASE) MCG/ACT inhaler Inhale 2 puffs into the lungs every 6 (six) hours as needed for wheezing or shortness of breath.       Marland Kitchen  estradiol (ESTRACE) 2 MG tablet Take 1 tablet (2 mg total) by mouth daily.  30 tablet  2  . folic acid (FOLVITE) 1 MG tablet Take 1 tablet (1 mg total) by mouth daily.  30 tablet  11  . ibuprofen (ADVIL,MOTRIN) 800 MG tablet Take 800 mg by mouth every 8 (eight) hours as needed for pain.       Marland Kitchen lithium 300 MG tablet Take 1 tablet every morning and take 2 tablets every night at bedtime.      Marland Kitchen oxyCODONE-acetaminophen (PERCOCET/ROXICET) 5-325 MG per tablet Take 1-2 tablets by mouth every 4 (four) hours as needed for pain.  50 tablet  0  . pantoprazole (PROTONIX) 40 MG tablet take 1 tablet by mouth once daily  30 tablet  3  . QUEtiapine (SEROQUEL) 400 MG tablet Take 300 mg by mouth at bedtime.       . cephALEXin (KEFLEX) 500 MG capsule Take 1 capsule (500 mg total) by mouth 3 (three) times daily.  21 capsule  0   No current facility-administered medications on file prior to visit.    BP 126/88  Pulse 90  Temp(Src) 98 F (36.7 C) (Oral)  Resp 16  Ht 5\' 5"  (1.651 m)  Wt 177 lb 1.9  oz (80.341 kg)  BMI 29.47 kg/m2  SpO2 97%  LMP 04/18/2010       Objective:   Physical Exam  Constitutional: She is oriented to person, place, and time. She appears well-developed and well-nourished. No distress.  Cardiovascular: Normal rate and regular rhythm.   No murmur heard. Pulmonary/Chest: Effort normal and breath sounds normal. No respiratory distress. She has no wheezes. She has no rales.  Musculoskeletal: She exhibits no edema.  R foot- no swelling, non-tender to touch.  Neurological: She is alert and oriented to person, place, and time.  Psychiatric: She has a normal mood and affect. Her behavior is normal. Judgment and thought content normal.          Assessment & Plan:

## 2013-06-21 NOTE — Assessment & Plan Note (Signed)
Refill provided for oxycodone.  I did advise pt that this can be habit forming and we should use for the least amount of time possible.  She is encouraged to get back in with ortho as soon as possible.  A controlled substance contract is signed today.

## 2013-06-21 NOTE — Assessment & Plan Note (Signed)
Foot pain most consistent with gout. Recommend trial of colcrys. Follow up if symptoms worsen or if symptoms do not improve.

## 2013-06-21 NOTE — Patient Instructions (Signed)
Please arrange follow up with ortho for your back/hip.   Call if foot pain worsens or if not improved in 2-3 days.  Follow up in 3 months.

## 2013-06-21 NOTE — Assessment & Plan Note (Signed)
B12 injection is given today.

## 2013-06-24 ENCOUNTER — Encounter: Payer: Self-pay | Admitting: Internal Medicine

## 2013-06-24 ENCOUNTER — Ambulatory Visit (INDEPENDENT_AMBULATORY_CARE_PROVIDER_SITE_OTHER): Payer: Medicare Other | Admitting: Internal Medicine

## 2013-06-24 ENCOUNTER — Other Ambulatory Visit (INDEPENDENT_AMBULATORY_CARE_PROVIDER_SITE_OTHER): Payer: Medicare Other

## 2013-06-24 VITALS — BP 130/82 | HR 90 | Ht 66.0 in | Wt 177.0 lb

## 2013-06-24 DIAGNOSIS — L500 Allergic urticaria: Secondary | ICD-10-CM

## 2013-06-24 DIAGNOSIS — L509 Urticaria, unspecified: Secondary | ICD-10-CM

## 2013-06-24 LAB — SEDIMENTATION RATE: Sed Rate: 18 mm/hr (ref 0–22)

## 2013-06-24 NOTE — Patient Instructions (Signed)
Order lab- Allergy profile, Food IgE profile, sed rate, ANA   Dx urticaria  Recommend you take a daily antihistamine like Claritin/ loratadine, every day for at least the next 2 months.

## 2013-06-24 NOTE — Progress Notes (Signed)
06/24/13- 52 yoF never smoker Referred by Sandford Craze, NP.  Breaking out in hives/rash all over the body, itching and swollen x1 month This is a new problem for her, starting one month ago. Mostly at night and afternoon. She has tried topical creams. She shows pictures of large urticarial plaques. No joint pain, adenopathy, fever or swelling in mouth or throat. She has not changed laundry products and has not recognized infection, and new medication or food. She did have spine surgery in May. History of latex allergy.  Prior to Admission medications   Medication Sig Start Date End Date Taking? Authorizing Provider  albuterol (PROVENTIL HFA;VENTOLIN HFA) 108 (90 BASE) MCG/ACT inhaler Inhale 2 puffs into the lungs every 6 (six) hours as needed for wheezing or shortness of breath.    Yes Historical Provider, MD  colchicine 0.6 MG tablet Two tabs by mouth at start of gout attack.  Take 1 tablet one hour later. 06/21/13  Yes Sandford Craze, NP  cyanocobalamin (,VITAMIN B-12,) 1000 MCG/ML injection Inject 1,000 mcg into the muscle every 30 (thirty) days.   Yes Historical Provider, MD  estradiol (ESTRACE) 2 MG tablet Take 1 tablet (2 mg total) by mouth daily. 05/04/13  Yes Sandford Craze, NP  folic acid (FOLVITE) 1 MG tablet Take 1 tablet (1 mg total) by mouth daily. 10/12/12  Yes Sandford Craze, NP  ibuprofen (ADVIL,MOTRIN) 800 MG tablet Take 800 mg by mouth every 8 (eight) hours as needed for pain.  08/23/12  Yes Tobin Chad, MD  lithium 300 MG tablet Take 1 tablet every morning and take 2 tablets every night at bedtime.   Yes Historical Provider, MD  oxyCODONE-acetaminophen (PERCOCET/ROXICET) 5-325 MG per tablet Take 1-2 tablets by mouth every 4 (four) hours as needed. 06/21/13  Yes Sandford Craze, NP  pantoprazole (PROTONIX) 40 MG tablet Take 1 tablet (40 mg total) by mouth daily. 06/21/13  Yes Sandford Craze, NP  QUEtiapine (SEROQUEL) 400 MG tablet Take 300 mg by mouth at bedtime.     Yes Historical Provider, MD   Past Medical History  Diagnosis Date  . Bipolar affective disorder 1985    ?nervous breakdown  . Thyroid disease     ? overactive thyroid  . Breast tumor   . B12 DEFICIENCY   . ANEMIA   . INTERSTITIAL LUNG DISEASE   . GASTROESOPHAGEAL REFLUX DISEASE   . BREAST CYST, LEFT   . GOITER   . Syncope   . Asthma   . H/O cardiovascular stress test     Lex MV 4/14:  No ischemia, EF 72%, normal wall motion  . Hyperthyroidism   . Anxiety    Past Surgical History  Procedure Laterality Date  . Tubal ligation  1985, 1996  . Gallbladder surgery  1985    stones removed  . Mediastinoscopy  2001  . Sternotomy  2001  . Bunionectomy      bilateral  . Abdominal hysterectomy  06/03/10  . Knee surgery  1987    bilateral  . Tonsillectomy    . Lumbar laminectomy/decompression microdiscectomy Right 01/13/2013    Procedure: MICRODISCECTOMY FORAMINOTOMY OF L4-5 ON THE RIGHT;  Surgeon: Drucilla Schmidt, MD;  Location: [facility];  Service: Orthopedics;  Laterality: Right;   Family History  Problem Relation Age of Onset  . Diabetes Mother   . Hypertension Mother   . Cancer Father     ?type  . Hypertension Father    History   Social History  . Marital Status:  Single    Spouse Name: N/A    Number of Children: 2  . Years of Education: N/A   Occupational History  .      Disability   Social History Main Topics  . Smoking status: Never Smoker   . Smokeless tobacco: Never Used  . Alcohol Use: No  . Drug Use: No  . Sexual Activity: Yes    Birth Control/ Protection: None   Other Topics Concern  . Not on file   Social History Narrative   single   Regular exercise:  Yes               ROS-see HPI Constitutional:   No-   weight loss, night sweats, fevers, chills, fatigue, lassitude. HEENT:   No-  headaches, difficulty swallowing, tooth/dental problems, sore throat,       No-  sneezing, itching, ear ache, nasal congestion, post nasal drip,  CV:  No-    chest pain, orthopnea, PND, swelling in lower extremities, anasarca, dizziness, palpitations Resp: No-   shortness of breath with exertion or at rest.              No-   productive cough,  No non-productive cough,  No- coughing up of blood.              No-   change in color of mucus.  No- wheezing.   Skin: No-   rash or lesions. GI:  No-   heartburn, indigestion, abdominal pain, nausea, vomiting, diarrhea,                 change in bowel habits, loss of appetite GU: No-   dysuria, change in color of urine, no urgency or frequency.  No- flank pain. MS:  No-   joint pain or swelling.  No- decreased range of motion.  No- back pain. Neuro-     nothing unusual Psych:  No- change in mood or affect. No depression or anxiety.  No memory loss.  OBJ- Physical Exam General- Alert, Oriented, Affect-appropriate, Distress- none acute Skin- rash-none, lesions- none, excoriation- none Lymphadenopathy- none Head- atraumatic            Eyes- Gross vision intact, PERRLA, conjunctivae and secretions clear            Ears- Hearing, canals-normal            Nose- Clear, no-Septal dev, mucus, polyps, erosion, perforation             Throat- Mallampati II , mucosa clear , drainage- none, tonsils- atrophic Neck- flexible , trachea midline, no stridor , thyroid nl, carotid no bruit Chest - symmetrical excursion , unlabored           Heart/CV- RRR , no murmur , no gallop  , no rub, nl s1 s2                           - JVD- none , edema- none, stasis changes- none, varices- none           Lung- clear to P&A, wheeze- none, cough- none , dullness-none, rub- none           Chest wall- +sternal incision scar "some kind of benign tumor at [facility] years ago" Abd- tender-no, distended-no, bowel sounds-present, HSM- no Br/ Gen/ Rectal- Not done, not indicated Extrem- cyanosis- none, clubbing, none, atrophy- none, strength- nl Neuro- grossly intact to observation

## 2013-06-27 LAB — ALLERGY FULL PROFILE
Bahia Grass: <0.1 kU/L
Bermuda Grass: 2.46 kU/L — ABNORMAL HIGH
Candida Albicans: <0.1 kU/L
Cat Dander: <0.1 kU/L
Curvularia lunata: <0.1 kU/L
D. farinae: 2.78 kU/L — ABNORMAL HIGH
Elm IgE: <0.1 kU/L
Fescue: <0.1 kU/L
G009 Red Top: <0.1 kU/L
Oak: <0.1 kU/L
Sycamore Tree: <0.1 kU/L
Timothy Grass: <0.1 kU/L

## 2013-06-27 LAB — ALLERGEN FOOD PROFILE SPECIFIC IGE
Corn: <0.1 kU/L
Fish Cod: <0.1 kU/L
IgE (Immunoglobulin E), Serum: 60.9 IU/mL (ref 0.0–180.0)
Milk IgE: <0.1 kU/L
Peanut IgE: <0.1 kU/L
Soybean IgE: <0.1 kU/L
Tuna IgE: <0.1 kU/L
Wheat IgE: <0.1 kU/L

## 2013-06-27 LAB — ANA: Anti Nuclear Antibody(ANA): NEGATIVE

## 2013-06-27 NOTE — Progress Notes (Signed)
Quick Note:  Advised pt of lab results per CY. Pt verbalized understanding and has no further questions ______

## 2013-06-29 ENCOUNTER — Telehealth: Payer: Self-pay | Admitting: *Deleted

## 2013-06-29 NOTE — Telephone Encounter (Signed)
I have contacted patient on behalf of [facility] G[facility] closure project) to request that she have a mammogram completed since she has not had one in 2 years. She states that [facility].

## 2013-07-04 ENCOUNTER — Telehealth: Payer: Self-pay | Admitting: *Deleted

## 2013-07-04 NOTE — Telephone Encounter (Signed)
Pt called stating she is seeing a different doctor for her back than the one we referred her to and she would like [place] to send her records to the new doctor.  Pt states she does not have this information available at present but will call me back tomorrow with the information.

## 2013-07-09 ENCOUNTER — Encounter: Payer: Self-pay | Admitting: Internal Medicine

## 2013-07-09 NOTE — Assessment & Plan Note (Signed)
If a  cause is not obvious, it is much less likely that we will ever find one. Plan- General and food allergy profiles, sedimentation rate, ANA, daily antihistamine. Watch need for more intensive antihistamine coverage including H1 and H2

## 2013-07-13 NOTE — Telephone Encounter (Signed)
Records faxed to Dr. Roby Lofts

## 2013-07-13 NOTE — Telephone Encounter (Signed)
Pt called back with fax [phone], Dr Roby Lofts. They are requesting record/referral before they will schedule pt an appt.  Can you fax the requested info to the above number?

## 2013-08-24 ENCOUNTER — Encounter: Payer: Self-pay | Admitting: Internal Medicine

## 2013-08-24 ENCOUNTER — Ambulatory Visit (INDEPENDENT_AMBULATORY_CARE_PROVIDER_SITE_OTHER): Payer: Medicare Other | Admitting: Internal Medicine

## 2013-08-24 VITALS — BP 122/80 | HR 86 | Ht 66.0 in | Wt 182.4 lb

## 2013-08-24 DIAGNOSIS — L509 Urticaria, unspecified: Secondary | ICD-10-CM

## 2013-08-24 NOTE — Progress Notes (Signed)
06/24/13- 36 yoF never smoker Referred by Debbrah Alar, NP.  Breaking out in hives/rash all over the body, itching and swollen x1 month This is a new problem for her, starting one month ago. Mostly at night and afternoon. She has tried topical creams. She shows pictures of large urticarial plaques. No joint pain, adenopathy, fever or swelling in mouth or throat. She has not changed laundry products and has not recognized infection, and new medication or food. She did have spine surgery in May. History of latex allergy.  08/24/13- 6 yoF never smoker Referred by Debbrah Alar, NP.  Breaking out in hives/rash all over the body, itching and swollen x1 month FOLLOWS FOR: Pt states she has had about 7-8 break outs of rash on her body since November 2014. She quit all medications when father died of cancer recently and has only just now restarted. Allergy Profile 06/24/13 Total IgE 60.9 Food IgE + only shrimp. She avoids all seafood except she does eat flounder. ANA Neg Sed rate 18  ROS-see HPI Constitutional:   No-   weight loss, night sweats, fevers, chills, fatigue, lassitude. HEENT:   No-  headaches, difficulty swallowing, tooth/dental problems, sore throat,       No-  sneezing, itching, ear ache, nasal congestion, post nasal drip,  CV:  No-   chest pain, orthopnea, PND, swelling in lower extremities, anasarca, dizziness, palpitations Resp: No-   shortness of breath with exertion or at rest.              No-   productive cough,  No non-productive cough,  No- coughing up of blood.              No-   change in color of mucus.  No- wheezing.   Skin: +HPI GI:  No-   heartburn, indigestion, abdominal pain, nausea, vomiting, GU:  MS:  No-   joint pain or swelling.   Neuro-     nothing unusual Psych:  No- change in mood or affect. No depression or anxiety.  No memory loss.  OBJ- Physical Exam General- Alert, Oriented, Affect-appropriate, Distress- none acute Skin- +hyperpigmented scar on  left shoulder she says is residual from urticaria Lymphadenopathy- none Head- atraumatic            Eyes- Gross vision intact, PERRLA, conjunctivae and secretions clear            Ears- Hearing, canals-normal            Nose- Clear, no-Septal dev, mucus, polyps, erosion, perforation             Throat- Mallampati II , mucosa clear , drainage- none, tonsils- atrophic Neck- flexible , trachea midline, no stridor , thyroid nl, carotid no bruit Chest - symmetrical excursion , unlabored           Heart/CV- RRR , no murmur , no gallop  , no rub, nl s1 s2                           - JVD- none , edema- none, stasis changes- none, varices- none           Lung- clear to P&A, wheeze- none, cough- none , dullness-none, rub- none           Chest wall- +sternal incision scar "some kind of benign tumor at [facility] years ago" Abd-  Br/ Gen/ Rectal- Not done, not indicated Extrem- cyanosis- none, clubbing, none, atrophy- none,  strength- nl Neuro- grossly intact to observation

## 2013-08-24 NOTE — Patient Instructions (Signed)
Watch for return of the hives as you restart your medicines  If hives come back, try taking a daily antihistamine- like claritin/ loratadine that is not likely to make you sleepy  You can add an H2 antihistamine like pepcid, tagamet or zantac if needed  Please call if needed

## 2013-08-25 ENCOUNTER — Telehealth: Payer: Self-pay | Admitting: Family

## 2013-08-25 NOTE — Telephone Encounter (Signed)
Would like to speak to nurse about having some paperwork faxed

## 2013-08-26 ENCOUNTER — Other Ambulatory Visit: Payer: Self-pay | Admitting: Family

## 2013-08-26 NOTE — Telephone Encounter (Signed)
Spoke with pt and she states she had to reschedule her appt with Dr Bernette Mayers and they discarded her referral. She is asking if we can re-send this information since she has rescheduled the appt.  Col[place]ta [place], can you fax this info to the # below?     Irven [place] at 07/13/2013 3:34 PM    Status: Signed       Records faxed to Dr. Ceasar Lund, CMA at 07/13/2013 3:01 PM     Status: Signed        Pt called back with fax [phone], Dr Bernette Mayers. They are requesting record/referral before they will schedule pt an appt. Can you fax the requested info to the above number?        Ronny Flurry, CMA at 07/04/2013 4:52 PM     Status: Signed        Pt called stating she is seeing a different doctor for her back than the one we referred her to and she would like [place] to send her records to the new doctor. Pt states she does not have this information available at present but will call me back tomorrow with the information.

## 2013-08-26 NOTE — Telephone Encounter (Signed)
Last office note faxed.

## 2013-09-17 NOTE — Assessment & Plan Note (Signed)
Labs reviewed without obvious specific trigger. She has improved but I can't tell from her history whether this is coincident with the time she has been off of medications. Plan-we will watch her pattern allergy has resumed her regular medications, for the possibility that she had a drug induced urticaria. Pay attention to foods for possible triggers.

## 2013-10-15 ENCOUNTER — Encounter (HOSPITAL_COMMUNITY): Payer: Self-pay | Admitting: Emergency Medicine

## 2013-10-15 ENCOUNTER — Emergency Department (HOSPITAL_COMMUNITY)
Admission: EM | Admit: 2013-10-15 | Discharge: 2013-10-15 | Disposition: A | Discharge status: Home / Self Care | Payer: Medicare Other | Attending: Emergency Medicine | Admitting: Emergency Medicine

## 2013-10-15 ENCOUNTER — Emergency Department (HOSPITAL_COMMUNITY): Payer: Medicare Other

## 2013-10-15 DIAGNOSIS — S39012A Strain of muscle, fascia and tendon of lower back, initial encounter: Secondary | ICD-10-CM

## 2013-10-15 DIAGNOSIS — F319 Bipolar disorder, unspecified: Secondary | ICD-10-CM | POA: Insufficient documentation

## 2013-10-15 DIAGNOSIS — Y929 Unspecified place or not applicable: Secondary | ICD-10-CM | POA: Insufficient documentation

## 2013-10-15 DIAGNOSIS — S060X9A Concussion with loss of consciousness of unspecified duration, initial encounter: Secondary | ICD-10-CM

## 2013-10-15 DIAGNOSIS — K219 Gastro-esophageal reflux disease without esophagitis: Secondary | ICD-10-CM | POA: Insufficient documentation

## 2013-10-15 DIAGNOSIS — Z862 Personal history of diseases of the blood and blood-forming organs and certain disorders involving the immune mechanism: Secondary | ICD-10-CM | POA: Insufficient documentation

## 2013-10-15 DIAGNOSIS — Z8639 Personal history of other endocrine, nutritional and metabolic disease: Secondary | ICD-10-CM | POA: Insufficient documentation

## 2013-10-15 DIAGNOSIS — W1809XA Striking against other object with subsequent fall, initial encounter: Secondary | ICD-10-CM | POA: Insufficient documentation

## 2013-10-15 DIAGNOSIS — S335XXA Sprain of ligaments of lumbar spine, initial encounter: Secondary | ICD-10-CM | POA: Insufficient documentation

## 2013-10-15 DIAGNOSIS — Y939 Activity, unspecified: Secondary | ICD-10-CM | POA: Insufficient documentation

## 2013-10-15 DIAGNOSIS — S060X1A Concussion with loss of consciousness of 30 minutes or less, initial encounter: Secondary | ICD-10-CM | POA: Insufficient documentation

## 2013-10-15 DIAGNOSIS — R1310 Dysphagia, unspecified: Secondary | ICD-10-CM | POA: Insufficient documentation

## 2013-10-15 DIAGNOSIS — F411 Generalized anxiety disorder: Secondary | ICD-10-CM | POA: Insufficient documentation

## 2013-10-15 DIAGNOSIS — S060XAA Concussion with loss of consciousness status unknown, initial encounter: Secondary | ICD-10-CM

## 2013-10-15 DIAGNOSIS — Z8742 Personal history of other diseases of the female genital tract: Secondary | ICD-10-CM | POA: Insufficient documentation

## 2013-10-15 DIAGNOSIS — Z79899 Other long term (current) drug therapy: Secondary | ICD-10-CM | POA: Insufficient documentation

## 2013-10-15 DIAGNOSIS — S161XXA Strain of muscle, fascia and tendon at neck level, initial encounter: Secondary | ICD-10-CM

## 2013-10-15 DIAGNOSIS — S139XXA Sprain of joints and ligaments of unspecified parts of neck, initial encounter: Secondary | ICD-10-CM | POA: Insufficient documentation

## 2013-10-15 DIAGNOSIS — Z91040 Latex allergy status: Secondary | ICD-10-CM | POA: Insufficient documentation

## 2013-10-15 DIAGNOSIS — J45909 Unspecified asthma, uncomplicated: Secondary | ICD-10-CM | POA: Insufficient documentation

## 2013-10-15 DIAGNOSIS — Z9889 Other specified postprocedural states: Secondary | ICD-10-CM | POA: Insufficient documentation

## 2013-10-15 MED ORDER — CYCLOBENZAPRINE HCL 10 MG PO TABS: 10.0000 mg | ORAL_TABLET | Freq: Two times a day (BID) | ORAL | Status: DC | PRN

## 2013-10-15 MED ORDER — HYDROCODONE-ACETAMINOPHEN 5-325 MG PO TABS: 1.0000 | ORAL_TABLET | ORAL | Status: DC | PRN

## 2013-10-15 MED ORDER — HYDROMORPHONE HCL PF 1 MG/ML IJ SOLN
1.0000 mg | Freq: Once | INTRAMUSCULAR | Status: AC
  Administered 2013-10-15: 1 mg via INTRAMUSCULAR
  Filled 2013-10-15: qty 1

## 2013-10-15 MED ORDER — DIAZEPAM 5 MG/ML IJ SOLN
5.0000 mg | Freq: Once | INTRAMUSCULAR | Status: AC
  Administered 2013-10-15: 5 mg via INTRAMUSCULAR
  Filled 2013-10-15: qty 2

## 2013-10-15 NOTE — ED Provider Notes (Signed)
CSN: [id number]     Arrival date & time 10/15/13  1522 History  This chart was scribed for non-physician practitioner Ignacia Felling working with Wandra Arthurs, MD by Mercy Moore, ED Scribe. This patient was seen in room TR07C/TR07C and the patient's care was started at 4:10 PM.   Chief Complaint  Patient presents with  . Fall      The history is provided by the patient. No language interpreter was used.   HPI Comments: Lynn Gonzalez is a 53 y.o. female who presents to the Emergency Department reporting lower back pain and left facial pain after a fall that occurred three days ago. The patient says she tripped over a curb, fell, and hit her head on the cement. Patient reports LOC for 9-10 minutes after fall and resting on the ground for an additional 30 minutes because she wasn't confident that her hip was strong enough to get up. Patient reports having a recent back surgery (01/13/2013) and reports lower back pain and numbness on the left side of her body, but no weakness. Patient reports left sided facial pain and pain with swallowing. Patient has attempted to manage her pain with Advil, but has experienced no relief. Patient denies nausea or vomiting, chest pain, or SOB since the fall.     Past Medical History  Diagnosis Date  . Bipolar affective disorder 1985    ?nervous breakdown  . Thyroid disease     ? overactive thyroid  . Breast tumor   . B12 DEFICIENCY   . ANEMIA   . INTERSTITIAL LUNG DISEASE   . GASTROESOPHAGEAL REFLUX DISEASE   . BREAST CYST, LEFT   . GOITER   . Syncope   . Asthma   . H/O cardiovascular stress test     Lex MV 4/14:  No ischemia, EF 72%, normal wall motion  . Hyperthyroidism   . Anxiety    Past Surgical History  Procedure Laterality Date  . Tubal ligation  1985, 1996  . Gallbladder surgery  1985    stones removed  . Mediastinoscopy  2001  . Sternotomy  2001  . Bunionectomy      bilateral  . Abdominal hysterectomy  06/03/10  . Knee surgery   1987    bilateral  . Tonsillectomy    . Lumbar laminectomy/decompression microdiscectomy Right 01/13/2013    Procedure: MICRODISCECTOMY FORAMINOTOMY OF L4-5 ON THE RIGHT;  Surgeon: Magnus Sinning, MD;  Location: [facility];  Service: Orthopedics;  Laterality: Right;   Family History  Problem Relation Age of Onset  . Diabetes Mother   . Hypertension Mother   . Cancer Father     ?type  . Hypertension Father    History  Substance Use Topics  . Smoking status: Never Smoker   . Smokeless tobacco: Never Used  . Alcohol Use: No   OB History   Grav Para Term Preterm Abortions TAB SAB Ect Mult Living                 Review of Systems  HENT: Negative for facial swelling.   Musculoskeletal: Positive for back pain.  Neurological: Positive for numbness and headaches. Negative for weakness.  All other systems reviewed and are negative.      Allergies  Adhesive and Latex  Home Medications   Current Outpatient Rx  Name  Route  Sig  Dispense  Refill  . albuterol (PROVENTIL HFA;VENTOLIN HFA) 108 (90 BASE) MCG/ACT inhaler   Inhalation   Inhale 2 puffs  into the lungs every 6 (six) hours as needed for wheezing or shortness of breath.          . colchicine 0.6 MG tablet   Oral   Take 0.6 mg by mouth daily as needed. Two tabs by mouth at start of gout attack.  Take 1 tablet one hour later.         . cyanocobalamin (,VITAMIN B-12,) 1000 MCG/ML injection   Intramuscular   Inject 1,000 mcg into the muscle every 30 (thirty) days.         Marland Kitchen estradiol (ESTRACE) 2 MG tablet      take 1 tablet by mouth once daily   30 tablet   2   . ibuprofen (ADVIL,MOTRIN) 800 MG tablet   Oral   Take 800 mg by mouth every 8 (eight) hours as needed for pain.          Marland Kitchen lithium 300 MG tablet      Take 1 tablet every morning and take 2 tablets every night at bedtime.         Marland Kitchen oxyCODONE-acetaminophen (PERCOCET/ROXICET) 5-325 MG per tablet   Oral   Take 1-2 tablets by mouth every 4 (four)  hours as needed for moderate pain.         . pantoprazole (PROTONIX) 40 MG tablet   Oral   Take 1 tablet (40 mg total) by mouth daily.   30 tablet   5   . QUEtiapine (SEROQUEL) 400 MG tablet   Oral   Take 300 mg by mouth at bedtime.           BP 147/94  Pulse 89  Temp(Src) 97.4 F (36.3 C) (Oral)  Resp 18  SpO2 98%  LMP 04/18/2010 Physical Exam  Nursing note and vitals reviewed. Constitutional: She is oriented to person, place, and time. She appears well-developed and well-nourished. No distress.  HENT:  Head: Normocephalic. Head is without raccoon's eyes and without Battle's sign.    Right Ear: External ear normal.  Left Ear: External ear normal.  Nose: Nose normal.  Mouth/Throat: Oropharynx is clear and moist. No oropharyngeal exudate.  Eyes: Conjunctivae are normal. Pupils are equal, round, and reactive to light. No scleral icterus.  Neck: Normal range of motion. Neck supple. Spinous process tenderness and muscular tenderness present.    Pulmonary/Chest: Effort normal. She exhibits no tenderness.  Musculoskeletal:       Thoracic back: She exhibits normal range of motion, no tenderness and no bony tenderness.       Lumbar back: She exhibits tenderness and bony tenderness. She exhibits normal range of motion.       Back:  Lymphadenopathy:    She has no cervical adenopathy.  Neurological: She is alert and oriented to person, place, and time. She displays no atrophy. No cranial nerve deficit or sensory deficit. She exhibits normal muscle tone. She displays a negative Romberg sign. Coordination and gait normal. GCS eye subscore is 4. GCS verbal subscore is 5. GCS motor subscore is 6.  Reflex Scores:      Patellar reflexes are 2+ on the right side and 2+ on the left side.      Achilles reflexes are 2+ on the right side and 2+ on the left side. 5/5 strength to bilateral hip flexors, quads, hamstrings  Skin: Skin is warm and dry. No rash noted. No erythema. No pallor.   Psychiatric: She has a normal mood and affect. Her behavior is normal. Judgment and thought content  normal.    ED Course  Procedures (including critical care time) DIAGNOSTIC STUDIES: Oxygen Saturation is 98% on room air, normal by my interpretation.    COORDINATION OF CARE: 4:15 PM- Will order anti inflammatory. Will order CT scan of head, neck and face and X-ray of lower back. Pt advised of plan for treatment and pt agrees.     Labs Review Labs Reviewed - No data to display Imaging Review Dg Lumbar Spine Complete  10/15/2013   CLINICAL DATA:  Fall 3 days ago with low back pain.  Prior surgery.  EXAM: LUMBAR SPINE - COMPLETE 4+ VIEW  COMPARISON:  DG SPINE PORTABLE 1 VIEW dated 01/13/2013; DG SPINE PORTABLE 1 VIEW dated 01/13/2013; DG SPINE PORTABLE 1 VIEW dated 01/13/2013; DG ABDOMEN 1V dated 10/03/2005  FINDINGS: Five lumbar type vertebral bodies. Sacroiliac joints are symmetric. Cholecystectomy clips. Maintenance of vertebral body height and alignment. Intervertebral disc heights are maintained.  IMPRESSION: No acute osseous abnormality.   Electronically Signed   By: Abigail Miyamoto M.D.   On: 10/15/2013 17:26   Ct Head Wo Contrast  10/15/2013   CLINICAL DATA:  [place]. Struck head and loss consciousness. Complaining of sharp pain and neck pain and back pain.  EXAM: CT HEAD WITHOUT CONTRAST  CT MAXILLOFACIAL WITHOUT CONTRAST  CT CERVICAL SPINE WITHOUT CONTRAST  TECHNIQUE: Multidetector CT imaging of the head, cervical spine, and maxillofacial structures were performed using the standard protocol without intravenous contrast. Multiplanar CT image reconstructions of the cervical spine and maxillofacial structures were also generated.  COMPARISON:  03/29/2012  FINDINGS: CT HEAD FINDINGS  Ventricles are normal in configuration. There is ventricular enlargement, greater than sulcal enlargement, but no convincing hydrocephalus. This likely due to a predominance of central volume loss. There are  no parenchymal masses or mass effect. There is no evidence of a cortical infarct. Patchy white matter hypoattenuation is noted consistent with mild chronic microvascular ischemic change.  There are no extra-axial masses or abnormal fluid collections.  No intracranial hemorrhage.  No skull fracture.  Findings stable from the prior head CT.  CT MAXILLOFACIAL FINDINGS  No fracture. Sinuses, mastoid air cells and middle ear cavities are clear. Normal globes and orbits. No soft tissue masses or significant asymmetry.  CT CERVICAL SPINE FINDINGS  No fracture. No spondylolisthesis. There is mild loss of disc height with endplate spurring at D34-534 and C6-C7. The central spinal canal and neural foramina are well preserved. The soft tissues are unremarkable there is scarring at the lung apices.  IMPRESSION: HEAD CT:  No acute intracranial abnormalities.  No skull fracture.  MAXILLOFACIAL CT:  No fracture or acute finding.  CERVICAL CT:  No fracture or acute finding.   Electronically Signed   By: Lajean Manes M.D.   On: 10/15/2013 17:21   Ct Cervical Spine Wo Contrast  10/15/2013   CLINICAL DATA:  [place]. Struck head and loss consciousness. Complaining of sharp pain and neck pain and back pain.  EXAM: CT HEAD WITHOUT CONTRAST  CT MAXILLOFACIAL WITHOUT CONTRAST  CT CERVICAL SPINE WITHOUT CONTRAST  TECHNIQUE: Multidetector CT imaging of the head, cervical spine, and maxillofacial structures were performed using the standard protocol without intravenous contrast. Multiplanar CT image reconstructions of the cervical spine and maxillofacial structures were also generated.  COMPARISON:  03/29/2012  FINDINGS: CT HEAD FINDINGS  Ventricles are normal in configuration. There is ventricular enlargement, greater than sulcal enlargement, but no convincing hydrocephalus. This likely due to a predominance of central volume loss. There  are no parenchymal masses or mass effect. There is no evidence of a cortical infarct. Patchy  white matter hypoattenuation is noted consistent with mild chronic microvascular ischemic change.  There are no extra-axial masses or abnormal fluid collections.  No intracranial hemorrhage.  No skull fracture.  Findings stable from the prior head CT.  CT MAXILLOFACIAL FINDINGS  No fracture. Sinuses, mastoid air cells and middle ear cavities are clear. Normal globes and orbits. No soft tissue masses or significant asymmetry.  CT CERVICAL SPINE FINDINGS  No fracture. No spondylolisthesis. There is mild loss of disc height with endplate spurring at N8-G9 and C6-C7. The central spinal canal and neural foramina are well preserved. The soft tissues are unremarkable there is scarring at the lung apices.  IMPRESSION: HEAD CT:  No acute intracranial abnormalities.  No skull fracture.  MAXILLOFACIAL CT:  No fracture or acute finding.  CERVICAL CT:  No fracture or acute finding.   Electronically Signed   By: Lajean Manes M.D.   On: 10/15/2013 17:21   Ct Maxillofacial Wo Cm  10/15/2013   CLINICAL DATA:  [place]. Struck head and loss consciousness. Complaining of sharp pain and neck pain and back pain.  EXAM: CT HEAD WITHOUT CONTRAST  CT MAXILLOFACIAL WITHOUT CONTRAST  CT CERVICAL SPINE WITHOUT CONTRAST  TECHNIQUE: Multidetector CT imaging of the head, cervical spine, and maxillofacial structures were performed using the standard protocol without intravenous contrast. Multiplanar CT image reconstructions of the cervical spine and maxillofacial structures were also generated.  COMPARISON:  03/29/2012  FINDINGS: CT HEAD FINDINGS  Ventricles are normal in configuration. There is ventricular enlargement, greater than sulcal enlargement, but no convincing hydrocephalus. This likely due to a predominance of central volume loss. There are no parenchymal masses or mass effect. There is no evidence of a cortical infarct. Patchy white matter hypoattenuation is noted consistent with mild chronic microvascular ischemic change.   There are no extra-axial masses or abnormal fluid collections.  No intracranial hemorrhage.  No skull fracture.  Findings stable from the prior head CT.  CT MAXILLOFACIAL FINDINGS  No fracture. Sinuses, mastoid air cells and middle ear cavities are clear. Normal globes and orbits. No soft tissue masses or significant asymmetry.  CT CERVICAL SPINE FINDINGS  No fracture. No spondylolisthesis. There is mild loss of disc height with endplate spurring at F6-O1 and C6-C7. The central spinal canal and neural foramina are well preserved. The soft tissues are unremarkable there is scarring at the lung apices.  IMPRESSION: HEAD CT:  No acute intracranial abnormalities.  No skull fracture.  MAXILLOFACIAL CT:  No fracture or acute finding.  CERVICAL CT:  No fracture or acute finding.   Electronically Signed   By: Lajean Manes M.D.   On: 10/15/2013 17:21     EKG Interpretation None      MDM   Minor head injury Cervical Strain Lumbar strain  Patient is 53 year old female who reportedly tripped on a curb and struck her head and lower back.  No fracture noted, + LOC, continues with headache, No alarming signs to suggest cauda equina, epidural hematoma or cord compression.  I personally performed the services described in this documentation, which was scribed in my presence. The recorded information has been reviewed and is accurate.   Idalia Needle Joelyn Oms, PA-C 10/15/13 1807

## 2013-10-15 NOTE — ED Notes (Signed)
Pt reports tripping on a curb and fell on wed. Hit head on cement. Pt is still having headache, left side facial pain, neck/shoulder pain and lower back pain. Ambulatory at triage.

## 2013-10-15 NOTE — ED Notes (Signed)
Patient transported to X-ray 

## 2013-10-15 NOTE — ED Notes (Signed)
Advised PA of discharge VS.  No orders.

## 2013-10-15 NOTE — Discharge Instructions (Signed)
Cervical Sprain A cervical sprain is an injury in the neck in which the strong, fibrous tissues (ligaments) that connect your neck bones stretch or tear. Cervical sprains can range from mild to severe. Severe cervical sprains can cause the neck vertebrae to be unstable. This can lead to damage of the spinal cord and can result in serious nervous system problems. The amount of time it takes for a cervical sprain to get better depends on the cause and extent of the injury. Most cervical sprains heal in 1 to 3 weeks. CAUSES  Severe cervical sprains may be caused by:   Contact sport injuries (such as from football, rugby, wrestling, hockey, auto racing, gymnastics, diving, martial arts, or boxing).   Motor vehicle collisions.   Whiplash injuries. This is an injury from a sudden forward-and backward whipping movement of the head and neck.  Falls.  Mild cervical sprains may be caused by:   Being in an awkward position, such as while cradling a telephone between your ear and shoulder.   Sitting in a chair that does not offer proper support.   Working at a poorly Landscape architect station.   Looking up or down for long periods of time.  SYMPTOMS   Pain, soreness, stiffness, or a burning sensation in the front, back, or sides of the neck. This discomfort may develop immediately after the injury or slowly, 24 hours or more after the injury.   Pain or tenderness directly in the middle of the back of the neck.   Shoulder or upper back pain.   Limited ability to move the neck.   Headache.   Dizziness.   Weakness, numbness, or tingling in the hands or arms.   Muscle spasms.   Difficulty swallowing or chewing.   Tenderness and swelling of the neck.  DIAGNOSIS  Most of the time your health care provider can diagnose a cervical sprain by taking your history and doing a physical exam. Your health care provider will ask about previous neck injuries and any known neck  problems, such as arthritis in the neck. X-rays may be taken to find out if there are any other problems, such as with the bones of the neck. Other tests, such as a CT scan or MRI, may also be needed.  TREATMENT  Treatment depends on the severity of the cervical sprain. Mild sprains can be treated with rest, keeping the neck in place (immobilization), and pain medicines. Severe cervical sprains are immediately immobilized. Further treatment is done to help with pain, muscle spasms, and other symptoms and may include:  Medicines, such as pain relievers, numbing medicines, or muscle relaxants.   Physical therapy. This may involve stretching exercises, strengthening exercises, and posture training. Exercises and improved posture can help stabilize the neck, strengthen muscles, and help stop symptoms from returning.  HOME CARE INSTRUCTIONS   Put ice on the injured area.   Put ice in a plastic bag.   Place a towel between your skin and the bag.   Leave the ice on for 15 20 minutes, 3 4 times a day.   If your injury was severe, you may have been given a cervical collar to wear. A cervical collar is a two-piece collar designed to keep your neck from moving while it heals.  Do not remove the collar unless instructed by your health care provider.  If you have long hair, keep it outside of the collar.  Ask your health care provider before making any adjustments to your collar.  Minor adjustments may be required over time to improve comfort and reduce pressure on your chin or on the back of your head.  Ifyou are allowed to remove the collar for cleaning or bathing, follow your health care provider's instructions on how to do so safely.  Keep your collar clean by wiping it with mild soap and water and drying it completely. If the collar you have been given includes removable pads, remove them every 1 2 days and hand wash them with soap and water. Allow them to air dry. They should be completely  dry before you wear them in the collar.  If you are allowed to remove the collar for cleaning and bathing, wash and dry the skin of your neck. Check your skin for irritation or sores. If you see any, tell your health care provider.  Do not drive while wearing the collar.   Only take over-the-counter or prescription medicines for pain, discomfort, or fever as directed by your health care provider.   Keep all follow-up appointments as directed by your health care provider.   Keep all physical therapy appointments as directed by your health care provider.   Make any needed adjustments to your workstation to promote good posture.   Avoid positions and activities that make your symptoms worse.   Warm up and stretch before being active to help prevent problems.  SEEK MEDICAL CARE IF:   Your pain is not controlled with medicine.   You are unable to decrease your pain medicine over time as planned.   Your activity level is not improving as expected.  SEEK IMMEDIATE MEDICAL CARE IF:   You develop any bleeding.  You develop stomach upset.  You have signs of an allergic reaction to your medicine.   Your symptoms get worse.   You develop new, unexplained symptoms.   You have numbness, tingling, weakness, or paralysis in any part of your body.  MAKE SURE YOU:   Understand these instructions.  Will watch your condition.  Will get help right away if you are not doing well or get worse. Document Released: 06/01/2007 Document Revised: 05/25/2013 Document Reviewed: 02/09/2013 [facility] Patient Information 2014 [place].  Cervical Sprain A cervical sprain is when the tissues (ligaments) that hold the neck bones in place stretch or tear. HOME CARE   Put ice on the injured area.  Put ice in a plastic bag.  Place a towel between your skin and the bag.  Leave the ice on for 15 20 minutes, 3 4 times a day.  You may have been given a collar to wear. This collar  keeps your neck from moving while you heal.  Do not take the collar off unless told by your doctor.  If you have long hair, keep it outside of the collar.  Ask your doctor before changing the position of your collar. You may need to change its position over time to make it more comfortable.  If you are allowed to take off the collar for cleaning or bathing, follow your doctor's instructions on how to do it safely.  Keep your collar clean by wiping it with mild soap and water. Dry it completely. If the collar has removable pads, remove them every 1 2 days to hand wash them with soap and water. Allow them to air dry. They should be dry before you wear them in the collar.  Do not drive while wearing the collar.  Only take medicine as told by your doctor.  Keep  all doctor visits as told.  Keep all physical therapy visits as told.  Adjust your work station so that you have good posture while you work.  Avoid positions and activities that make your problems worse.  Warm up and stretch before being active. GET HELP IF:  Your pain is not controlled with medicine.  You cannot take less pain medicine over time as planned.  Your activity level does not improve as expected. GET HELP RIGHT AWAY IF:   You are bleeding.  Your stomach is upset.  You have an allergic reaction to your medicine.  You develop new problems that you cannot explain.  You lose feeling (become numb) or you cannot move any part of your body (paralysis).  You have tingling or weakness in any part of your body.  Your symptoms get worse. Symptoms include:  Pain, soreness, stiffness, puffiness (swelling), or a burning feeling in your neck.  Pain when your neck is touched.  Shoulder or upper back pain.  Limited ability to move your neck.  Headache.  Dizziness.  Your hands or arms feel week, lose feeling, or tingle.  Muscle spasms.  Difficulty swallowing or chewing. MAKE SURE YOU:   Understand  these instructions.  Will watch your condition.  Will get help right away if you are not doing well or get worse. Document Released: 01/21/2008 Document Revised: 04/06/2013 Document Reviewed: 02/09/2013 [facility] Patient Information 2014 [place].  Lumbosacral Strain Lumbosacral strain is a strain of any of the parts that make up your lumbosacral vertebrae. Your lumbosacral vertebrae are the bones that make up the lower third of your backbone. Your lumbosacral vertebrae are held together by muscles and tough, fibrous tissue (ligaments).  CAUSES  A sudden blow to your back can cause lumbosacral strain. Also, anything that causes an excessive stretch of the muscles in the low back can cause this strain. This is typically seen when people exert themselves strenuously, fall, lift heavy objects, bend, or crouch repeatedly. RISK FACTORS  Physically demanding work.  Participation in pushing or pulling sports or sports that require sudden twist of the back (tennis, golf, baseball).  Weight lifting.  Excessive lower back curvature.  Forward-tilted pelvis.  Weak back or abdominal muscles or both.  Tight hamstrings. SIGNS AND SYMPTOMS  Lumbosacral strain may cause pain in the area of your injury or pain that moves (radiates) down your leg.  DIAGNOSIS Your health care provider can often diagnose lumbosacral strain through a physical exam. In some cases, you may need tests such as X-ray exams.  TREATMENT  Treatment for your lower back injury depends on many factors that your clinician will have to evaluate. However, most treatment will include the use of anti-inflammatory medicines. HOME CARE INSTRUCTIONS   Avoid hard physical activities (tennis, racquetball, waterskiing) if you are not in proper physical condition for it. This may aggravate or create problems.  If you have a back problem, avoid sports requiring sudden body movements. Swimming and walking are generally safer  activities.  Maintain good posture.  Maintain a healthy weight.  For acute conditions, you may put ice on the injured area.  Put ice in a plastic bag.  Place a towel between your skin and the bag.  Leave the ice on for 20 minutes, 2 3 times a day.  When the low back starts healing, stretching and strengthening exercises may be recommended. SEEK MEDICAL CARE IF:  Your back pain is getting worse.  You experience severe back pain not relieved with medicines.  SEEK IMMEDIATE MEDICAL CARE IF:   You have numbness, tingling, weakness, or problems with the use of your arms or legs.  There is a change in bowel or bladder control.  You have increasing pain in any area of the body, including your belly (abdomen).  You notice shortness of breath, dizziness, or feel faint.  You feel sick to your stomach (nauseous), are throwing up (vomiting), or become sweaty.  You notice discoloration of your toes or legs, or your feet get very cold. MAKE SURE YOU:   Understand these instructions.  Will watch your condition.  Will get help right away if you are not doing well or get worse. Document Released: 05/14/2005 Document Revised: 05/25/2013 Document Reviewed: 03/23/2013 [facility] Patient Information 2014 [place], [place].

## 2013-10-16 ENCOUNTER — Other Ambulatory Visit: Payer: Self-pay | Admitting: Family

## 2013-10-16 NOTE — ED Provider Notes (Signed)
Medical screening examination/treatment/procedure(s) were performed by non-physician practitioner and as supervising physician I was immediately available for consultation/collaboration.   EKG Interpretation None        Wandra Arthurs, MD 10/16/13 0002

## 2013-10-20 ENCOUNTER — Encounter (HOSPITAL_COMMUNITY): Payer: Self-pay | Admitting: Emergency Medicine

## 2013-10-20 ENCOUNTER — Emergency Department (HOSPITAL_COMMUNITY)
Admission: EM | Admit: 2013-10-20 | Discharge: 2013-10-20 | Discharge status: Against Medical Advice | Payer: Medicare Other | Attending: Emergency Medicine | Admitting: Emergency Medicine

## 2013-10-20 DIAGNOSIS — R030 Elevated blood-pressure reading, without diagnosis of hypertension: Secondary | ICD-10-CM | POA: Insufficient documentation

## 2013-10-20 DIAGNOSIS — J45909 Unspecified asthma, uncomplicated: Secondary | ICD-10-CM | POA: Insufficient documentation

## 2013-10-20 NOTE — ED Notes (Signed)
Pt. reports elevated blood pressure yesterday =155/117 with headache , alert and oriented , respirations unlabored /ambulatory .

## 2013-10-21 ENCOUNTER — Ambulatory Visit: Payer: Medicare Other | Admitting: Physician Assistant

## 2013-10-24 ENCOUNTER — Encounter: Payer: Self-pay | Admitting: Family

## 2013-10-24 ENCOUNTER — Ambulatory Visit (INDEPENDENT_AMBULATORY_CARE_PROVIDER_SITE_OTHER): Payer: Medicare Other | Admitting: Family

## 2013-10-24 VITALS — BP 136/90 | HR 83 | Temp 98.0°F | Resp 16 | Ht 65.0 in | Wt 181.1 lb

## 2013-10-24 DIAGNOSIS — L509 Urticaria, unspecified: Secondary | ICD-10-CM

## 2013-10-24 DIAGNOSIS — E538 Deficiency of other specified B group vitamins: Secondary | ICD-10-CM

## 2013-10-24 DIAGNOSIS — I10 Essential (primary) hypertension: Secondary | ICD-10-CM | POA: Insufficient documentation

## 2013-10-24 MED ORDER — FOLIC ACID 1 MG PO TABS: ORAL_TABLET | ORAL | Status: DC

## 2013-10-24 MED ORDER — AMLODIPINE BESYLATE 5 MG PO TABS: 5.0000 mg | ORAL_TABLET | Freq: Every day | ORAL | Status: DC

## 2013-10-24 MED ORDER — CYANOCOBALAMIN 1000 MCG/ML IJ SOLN
1000.0000 ug | Freq: Once | INTRAMUSCULAR | Status: AC
  Administered 2013-10-24: 1000 ug via INTRAMUSCULAR

## 2013-10-24 NOTE — Assessment & Plan Note (Addendum)
Stable today without rash present- defer management to Dr. Annamaria Boots.

## 2013-10-24 NOTE — Assessment & Plan Note (Signed)
B12 injection provided today

## 2013-10-24 NOTE — Assessment & Plan Note (Addendum)
New.  Elevated blood pressure again today in the office. Started Amlodipine 5mg  daily. Patient to follow up in one month for recheck and BMET.  BP Readings from Last 3 Encounters:  10/24/13 136/90  10/20/13 146/100  10/15/13 155/106

## 2013-10-24 NOTE — Patient Instructions (Signed)
Please start amlodipine for your blood pressure. This will hopefully also help with headaches. Work on low sodium diet. Follow up in 1 month.   Sodium-Controlled Diet Sodium is a mineral. It is found in many foods. Sodium may be found naturally or added during the making of a food. The most common form of sodium is salt, which is made up of sodium and chloride. Reducing your sodium intake involves changing your eating habits. The following guidelines will help you reduce the sodium in your diet:  Stop using the salt shaker.  Use salt sparingly in cooking and baking.  Substitute with sodium-free seasonings and spices.  Do not use a salt substitute (potassium chloride) without your caregiver's permission.  Include a variety of fresh, unprocessed foods in your diet.  Limit the use of processed and convenience foods that are high in sodium. USE THE FOLLOWING FOODS SPARINGLY: Breads/Starches  Commercial bread stuffing, commercial pancake or waffle mixes, coating mixes. Waffles. Croutons. Prepared (boxed or frozen) potato, rice, or noodle mixes that contain salt or sodium. Salted [place] fries or hash browns. Salted popcorn, breads, crackers, chips, or snack foods. Vegetables  Vegetables canned with salt or prepared in cream, butter, or cheese sauces. Sauerkraut. Tomato or vegetable juices canned with salt.  Fresh vegetables are allowed if rinsed thoroughly. Fruit  Fruit is okay to eat. Meat and Meat Substitutes  Salted or smoked meats, such as bacon or Canadian bacon, chipped or corned beef, hot dogs, salt pork, luncheon meats, pastrami, ham, or sausage. Canned or smoked fish, poultry, or meat. Processed cheese or cheese spreads, blue or Roquef[place] cheese. Battered or frozen fish products. Prepared spaghetti sauce. Baked beans. Reuben sandwiches. Salted nuts. Caviar. Milk  Limit buttermilk to 1 cup per week. Soups and Combination Foods  Bouillon cubes, canned or dried soups, broth,  consomm. Convenience (frozen or packaged) dinners with more than 600 mg sodium. Pot pies, pizza, Asian food, fast food cheeseburgers, and specialty sandwiches. Desserts and Sweets  Regular (salted) desserts, pie, commercial fruit snack pies, commercial snack cakes, canned puddings.  Eat desserts and sweets in moderation. Fats and Oils  Gravy mixes or canned gravy. No more than 1 to 2 tbs of salad dressing. Chip dips.  Eat fats and oils in moderation. Beverages  See those listed under the vegetables and milk groups. Condiments  Ketchup, mustard, meat sauces, salsa, regular (salted) and lite soy sauce or mustard. Dill pickles, olives, meat tenderizer. Prepared horseradish or pickle relish. Dutch-processed cocoa. Baking powder or baking soda used medicinally. Worcestershire sauce. "Light" salt. Salt substitute, unless approved by your caregiver. Document Released: 01/24/2002 Document Revised: 10/27/2011 Document Reviewed: 08/27/2009 [facility] Patient Information 2014 [place], [place].

## 2013-10-24 NOTE — Progress Notes (Signed)
Pre visit review using our clinic review tool, if applicable. No additional management support is needed unless otherwise documented below in the visit note. 

## 2013-10-24 NOTE — Progress Notes (Signed)
Subjective:    Patient ID: Lynn Gonzalez, female    DOB: 1961/01/25, 53 y.o.   MRN: [id number]  Hypertension Associated symptoms include headaches. Pertinent negatives include no chest pain or shortness of breath.  Rash Pertinent negatives include no shortness of breath.   Lynn Gonzalez is a 53 year old female who presents today for follow up.  Rash) Patient following with Dr. Annamaria Boots with [facility] for intermittent rash outbreak. Patient is taking Benadryl and reports relief.    Elevated blood pressure) Patient reports she's measured her blood pressure several times at [employer] and also at the ED which has shown an elevated result. Patient reports she has been under stress at home and is experiencing headaches daily. Patient is currently not on any medication for elevated blood pressure.    BP Readings from Last 3 Encounters:  10/24/13 136/90  10/20/13 146/100  10/15/13 155/106    Review of Systems  Respiratory: Negative for shortness of breath.   Cardiovascular: Negative for chest pain and leg swelling.  Gastrointestinal: Negative for nausea.  Skin: Positive for rash.  Neurological: Positive for dizziness and headaches.  Psychiatric/Behavioral:       Patient reports recent stress at home and is working towards moving to another apartment.   Past Medical History  Diagnosis Date  . Bipolar affective disorder 1985    ?nervous breakdown  . Thyroid disease     ? overactive thyroid  . Breast tumor   . B12 DEFICIENCY   . ANEMIA   . INTERSTITIAL LUNG DISEASE   . GASTROESOPHAGEAL REFLUX DISEASE   . BREAST CYST, LEFT   . GOITER   . Syncope   . Asthma   . H/O cardiovascular stress test     Lex MV 4/14:  No ischemia, EF 72%, normal wall motion  . Hyperthyroidism   . Anxiety     History   Social History  . Marital Status: Single    Spouse Name: N/A    Number of Children: 2  . Years of Education: N/A   Occupational History  .      Disability   Social  History Main Topics  . Smoking status: Never Smoker   . Smokeless tobacco: Never Used  . Alcohol Use: No  . Drug Use: No  . Sexual Activity: Yes    Birth Control/ Protection: None   Other Topics Concern  . Not on file   Social History Narrative   single   Regular exercise:  Yes                Past Surgical History  Procedure Laterality Date  . Tubal ligation  1985, 1996  . Gallbladder surgery  1985    stones removed  . Mediastinoscopy  2001  . Sternotomy  2001  . Bunionectomy      bilateral  . Abdominal hysterectomy  06/03/10  . Knee surgery  1987    bilateral  . Tonsillectomy    . Lumbar laminectomy/decompression microdiscectomy Right 01/13/2013    Procedure: MICRODISCECTOMY FORAMINOTOMY OF L4-5 ON THE RIGHT;  Surgeon: Magnus Sinning, MD;  Location: [facility];  Service: Orthopedics;  Laterality: Right;    Family History  Problem Relation Age of Onset  . Diabetes Mother   . Hypertension Mother   . Cancer Father     ?type  . Hypertension Father     Allergies  Allergen Reactions  . Adhesive [Tape] Rash  . Latex Rash    Current Outpatient  Prescriptions on File Prior to Visit  Medication Sig Dispense Refill  . albuterol (PROVENTIL HFA;VENTOLIN HFA) 108 (90 BASE) MCG/ACT inhaler Inhale 2 puffs into the lungs every 6 (six) hours as needed for wheezing or shortness of breath.       . colchicine 0.6 MG tablet Take 0.6 mg by mouth daily as needed. Two tabs by mouth at start of gout attack.  Take 1 tablet one hour later.      . cyanocobalamin (,VITAMIN B-12,) 1000 MCG/ML injection Inject 1,000 mcg into the muscle every 30 (thirty) days.      . cyclobenzaprine (FLEXERIL) 10 MG tablet Take 1 tablet (10 mg total) by mouth 2 (two) times daily as needed for muscle spasms.  20 tablet  0  . estradiol (ESTRACE) 2 MG tablet take 1 tablet by mouth once daily  30 tablet  2  . HYDROcodone-acetaminophen (NORCO/VICODIN) 5-325 MG per tablet Take 1 tablet by mouth every 4 (four) hours  as needed.  20 tablet  0  . lithium 300 MG tablet Take 1 tablet every morning and take 2 tablets every night at bedtime.      Marland Kitchen oxyCODONE-acetaminophen (PERCOCET/ROXICET) 5-325 MG per tablet Take 1-2 tablets by mouth every 4 (four) hours as needed for moderate pain.      . pantoprazole (PROTONIX) 40 MG tablet Take 1 tablet (40 mg total) by mouth daily.  30 tablet  5  . QUEtiapine (SEROQUEL) 400 MG tablet Take 300 mg by mouth at bedtime.        No current facility-administered medications on file prior to visit.    BP 136/90  Pulse 83  Temp(Src) 98 F (36.7 C) (Oral)  Resp 16  Ht 5\' 5"  (1.651 m)  Wt 181 lb 1.9 oz (82.155 kg)  BMI 30.14 kg/m2  SpO2 99%  LMP 04/18/2010       Objective:   Physical Exam  Constitutional: She is oriented to person, place, and time. She appears well-nourished.  HENT:  Head: Normocephalic.  Cardiovascular: Normal rate, regular rhythm, S1 normal, S2 normal and normal heart sounds.   Pulmonary/Chest: Effort normal and breath sounds normal. No respiratory distress. She has no wheezes.  Neurological: She is alert and oriented to person, place, and time.  Skin: Skin is warm and dry.  Psychiatric: She has a normal mood and affect.          Assessment & Plan:  I have personally seen and examined patient and agree with Alma Friendly NP-student's assessment and plan.

## 2013-10-24 NOTE — Addendum Note (Signed)
Addended by: Kelle Darting A on: 10/24/2013 10:21 AM   Modules accepted: Orders

## 2013-10-25 ENCOUNTER — Telehealth: Payer: Self-pay | Admitting: Family

## 2013-10-25 NOTE — Telephone Encounter (Signed)
Relevant patient education mailed to patient.  

## 2013-10-26 ENCOUNTER — Ambulatory Visit: Payer: Medicare Other | Admitting: Family

## 2013-10-28 ENCOUNTER — Encounter: Payer: Self-pay | Admitting: Family

## 2013-10-28 ENCOUNTER — Ambulatory Visit (INDEPENDENT_AMBULATORY_CARE_PROVIDER_SITE_OTHER): Payer: Medicare Other | Admitting: Family

## 2013-10-28 ENCOUNTER — Ambulatory Visit: Payer: Medicare Other | Admitting: Family

## 2013-10-28 VITALS — BP 134/84 | HR 77 | Temp 97.5°F | Resp 16 | Ht 65.0 in | Wt 184.1 lb

## 2013-10-28 DIAGNOSIS — R209 Unspecified disturbances of skin sensation: Secondary | ICD-10-CM

## 2013-10-28 DIAGNOSIS — R200 Anesthesia of skin: Secondary | ICD-10-CM | POA: Insufficient documentation

## 2013-10-28 MED ORDER — MELOXICAM 7.5 MG PO TABS: 7.5000 mg | ORAL_TABLET | Freq: Every day | ORAL | Status: DC

## 2013-10-28 NOTE — Progress Notes (Signed)
Subjective:    Patient ID: Lynn Gonzalez, female    DOB: 03-Apr-1961, 53 y.o.   MRN: [id number]  HPI  Lynn Gonzalez is a 53 yr old female who presents today with chief complaint of numbness of the right thumb x 5 days.  HTN-  Started amlodipine last night.  BP Readings from Last 3 Encounters:  10/28/13 134/84  10/24/13 136/90  10/20/13 146/100    Review of Systems See HPI  Past Medical History  Diagnosis Date  . Bipolar affective disorder 1985    ?nervous breakdown  . Thyroid disease     ? overactive thyroid  . Breast tumor   . B12 DEFICIENCY   . ANEMIA   . INTERSTITIAL LUNG DISEASE   . GASTROESOPHAGEAL REFLUX DISEASE   . BREAST CYST, LEFT   . GOITER   . Syncope   . Asthma   . H/O cardiovascular stress test     Lex MV 4/14:  No ischemia, EF 72%, normal wall motion  . Hyperthyroidism   . Anxiety     History   Social History  . Marital Status: Single    Spouse Name: N/A    Number of Children: 2  . Years of Education: N/A   Occupational History  .      Disability   Social History Main Topics  . Smoking status: Never Smoker   . Smokeless tobacco: Never Used  . Alcohol Use: No  . Drug Use: No  . Sexual Activity: Yes    Birth Control/ Protection: None   Other Topics Concern  . Not on file   Social History Narrative   single   Regular exercise:  Yes                Past Surgical History  Procedure Laterality Date  . Tubal ligation  1985, 1996  . Gallbladder surgery  1985    stones removed  . Mediastinoscopy  2001  . Sternotomy  2001  . Bunionectomy      bilateral  . Abdominal hysterectomy  06/03/10  . Knee surgery  1987    bilateral  . Tonsillectomy    . Lumbar laminectomy/decompression microdiscectomy Right 01/13/2013    Procedure: MICRODISCECTOMY FORAMINOTOMY OF L4-5 ON THE RIGHT;  Surgeon: Magnus Sinning, MD;  Location: [facility];  Service: Orthopedics;  Laterality: Right;    Family History  Problem Relation Age of Onset  .  Diabetes Mother   . Hypertension Mother   . Cancer Father     ?type  . Hypertension Father     Allergies  Allergen Reactions  . Adhesive [Tape] Rash  . Latex Rash    Current Outpatient Prescriptions on File Prior to Visit  Medication Sig Dispense Refill  . albuterol (PROVENTIL HFA;VENTOLIN HFA) 108 (90 BASE) MCG/ACT inhaler Inhale 2 puffs into the lungs every 6 (six) hours as needed for wheezing or shortness of breath.       Marland Kitchen amLODipine (NORVASC) 5 MG tablet Take 1 tablet (5 mg total) by mouth daily.  30 tablet  2  . colchicine 0.6 MG tablet Take 0.6 mg by mouth daily as needed. Two tabs by mouth at start of gout attack.  Take 1 tablet one hour later.      . cyanocobalamin (,VITAMIN B-12,) 1000 MCG/ML injection Inject 1,000 mcg into the muscle every 30 (thirty) days.      . cyclobenzaprine (FLEXERIL) 10 MG tablet Take 1 tablet (10 mg total) by mouth 2 (two)  times daily as needed for muscle spasms.  20 tablet  0  . estradiol (ESTRACE) 2 MG tablet take 1 tablet by mouth once daily  30 tablet  2  . folic acid (FOLVITE) 1 MG tablet take 1 tablet by mouth once daily  30 tablet  11  . HYDROcodone-acetaminophen (NORCO/VICODIN) 5-325 MG per tablet Take 1 tablet by mouth every 4 (four) hours as needed.  20 tablet  0  . lithium 300 MG tablet Take 1 tablet every morning and take 2 tablets every night at bedtime.      Marland Kitchen oxyCODONE-acetaminophen (PERCOCET/ROXICET) 5-325 MG per tablet Take 1-2 tablets by mouth every 4 (four) hours as needed for moderate pain.      . pantoprazole (PROTONIX) 40 MG tablet Take 1 tablet (40 mg total) by mouth daily.  30 tablet  5  . QUEtiapine (SEROQUEL) 400 MG tablet Take 300 mg by mouth at bedtime.        No current facility-administered medications on file prior to visit.    BP 134/84  Pulse 77  Temp(Src) 97.5 F (36.4 C) (Oral)  Resp 16  Ht 5\' 5"  (1.651 m)  Wt 184 lb 1.9 oz (83.516 kg)  BMI 30.64 kg/m2  SpO2 99%  LMP 04/18/2010       Objective:    Physical Exam  Constitutional: She is oriented to person, place, and time. She appears well-developed and well-nourished. No distress.  HENT:  Head: Normocephalic.  Neurological: She is alert and oriented to person, place, and time.  Diminished sensation to light touch of palmar surface of right distal thumb  Psychiatric: She has a normal mood and affect. Her behavior is normal. Judgment and thought content normal.          Assessment & Plan:

## 2013-10-28 NOTE — Assessment & Plan Note (Signed)
Suspect medial nerve involvement. Advised that she wear wrist brace (provided today).  Trial of short course of meloxicam.Call if symptoms worsen, or if symptoms do not improve.

## 2013-10-28 NOTE — Patient Instructions (Signed)
Please start meloxicam. Wear right wrist brace at night as as you are able during the day. Follow up in 1 month.

## 2013-10-28 NOTE — Progress Notes (Signed)
Pre visit review using our clinic review tool, if applicable. No additional management support is needed unless otherwise documented below in the visit note. 

## 2013-11-22 ENCOUNTER — Ambulatory Visit: Payer: Medicare Other | Admitting: Family

## 2013-11-23 ENCOUNTER — Ambulatory Visit: Payer: Medicare Other | Admitting: Family

## 2013-11-23 ENCOUNTER — Telehealth: Payer: Self-pay | Admitting: *Deleted

## 2013-11-23 NOTE — Telephone Encounter (Signed)
Received call from pt stating she scheduled and appointment with [facility] and they are needing her imaging results done on her back. Faxed lumbar spine xray and CT cervical spine to [phone].

## 2013-11-29 ENCOUNTER — Ambulatory Visit: Payer: Medicare Other | Admitting: Family

## 2013-12-02 ENCOUNTER — Telehealth: Payer: Self-pay | Admitting: *Deleted

## 2013-12-02 NOTE — Telephone Encounter (Signed)
Spoke with Freda Munro. She reiterated information below. States she was trying to get additional information from pt due to surgery within the last year. I advised her we will wait to hear back from pt. She has discarded referral.

## 2013-12-02 NOTE — Telephone Encounter (Signed)
Received message from [place] at [facility] stating she had contacted pt to obtain some information they needed re: her new patient visit to their office. She reports that pt became upset and told her shred her referral and hung up. She requested that I return her call. Attempted to reach Freda Munro and left message to return my call.

## 2013-12-06 ENCOUNTER — Ambulatory Visit: Payer: Medicare Other | Admitting: Family

## 2013-12-06 DIAGNOSIS — Z0289 Encounter for other administrative examinations: Secondary | ICD-10-CM

## 2014-01-16 ENCOUNTER — Other Ambulatory Visit: Payer: Self-pay | Admitting: Family

## 2014-01-18 ENCOUNTER — Other Ambulatory Visit: Payer: Self-pay | Admitting: Orthopaedic Surgery

## 2014-01-18 DIAGNOSIS — M545 Low back pain, unspecified: Secondary | ICD-10-CM

## 2014-01-18 DIAGNOSIS — M25552 Pain in left hip: Secondary | ICD-10-CM

## 2014-01-26 ENCOUNTER — Ambulatory Visit
Admission: RE | Admit: 2014-01-26 | Discharge: 2014-01-26 | Disposition: A | Discharge status: Home / Self Care | Payer: Medicare Other | Source: Ambulatory Visit | Attending: Orthopaedic Surgery | Admitting: Orthopaedic Surgery

## 2014-01-26 DIAGNOSIS — M545 Low back pain, unspecified: Secondary | ICD-10-CM

## 2014-01-26 DIAGNOSIS — M25552 Pain in left hip: Secondary | ICD-10-CM

## 2014-04-26 ENCOUNTER — Other Ambulatory Visit: Payer: Self-pay | Admitting: Family

## 2014-05-03 ENCOUNTER — Other Ambulatory Visit: Payer: Self-pay | Admitting: Family

## 2014-05-03 NOTE — Telephone Encounter (Signed)
Amlodipine refill sent to pharmacy. Pt was last seen in March and advised follow up in 1 month and is past due.  Please call pt to arrange appointment.

## 2014-05-04 NOTE — Telephone Encounter (Signed)
Informed patient of medication refill and she scheduled appointment for tomorrow

## 2014-05-05 ENCOUNTER — Ambulatory Visit: Payer: Medicare Other | Admitting: Family

## 2014-05-05 ENCOUNTER — Telehealth: Payer: Self-pay | Admitting: *Deleted

## 2014-05-05 NOTE — Telephone Encounter (Signed)
Confidential Office Message [address] [place], [place] [address] p. [phone] f. [phone] To: Arnold Long at [facility] (After Hours Triage) Fax: [phone] From: [facility] Date/ Time: 05/04/2014 11:56 PM Taken By: Lind Covert, RN Caller: [place]: not collected Patient: Selina Cooley DOB: May 19, 1961 Phone: [phone] Reason for Call: See info below Regarding Appointment: Yes Appt Date: 05/05/2014 Appt Time: 1:30:00 PM Provider: Debbrah Alar (Adults only) Reason: Cancel Appointment Details: Pt had injections in leg 9/16 and her leg is still numb, so she is not able to come at this time. Pt will call 9/18 at reschedule. Confidential Outcome: Cancelled appointment in EP[facility])

## 2014-05-18 ENCOUNTER — Other Ambulatory Visit: Payer: Self-pay | Admitting: Family

## 2014-05-18 ENCOUNTER — Other Ambulatory Visit: Payer: Self-pay

## 2014-05-18 DIAGNOSIS — N6452 Nipple discharge: Secondary | ICD-10-CM

## 2014-05-18 DIAGNOSIS — N63 Unspecified lump in unspecified breast: Secondary | ICD-10-CM

## 2014-05-26 ENCOUNTER — Other Ambulatory Visit: Payer: Medicare Other

## 2014-05-26 ENCOUNTER — Inpatient Hospital Stay: Admission: RE | Admit: 2014-05-26 | Payer: Medicare Other | Source: Ambulatory Visit

## 2014-06-27 ENCOUNTER — Inpatient Hospital Stay: Admission: RE | Admit: 2014-06-27 | Payer: Medicare Other | Source: Ambulatory Visit

## 2014-06-27 ENCOUNTER — Other Ambulatory Visit: Payer: Medicare Other

## 2014-07-19 ENCOUNTER — Ambulatory Visit: Payer: Medicare Other | Admitting: Family

## 2014-07-21 ENCOUNTER — Other Ambulatory Visit: Payer: Self-pay | Admitting: Specialist

## 2014-07-21 DIAGNOSIS — M5442 Lumbago with sciatica, left side: Secondary | ICD-10-CM

## 2014-08-07 ENCOUNTER — Ambulatory Visit: Payer: Medicare Other | Admitting: Family

## 2014-08-07 ENCOUNTER — Telehealth: Payer: Self-pay | Admitting: Family

## 2014-08-07 NOTE — Telephone Encounter (Signed)
Pt canceled due to her son getting injured in a football game pt [facility] to 09/01/14.

## 2014-08-07 NOTE — Telephone Encounter (Signed)
Ok, please do not charge no show fee.

## 2014-08-22 ENCOUNTER — Ambulatory Visit
Admission: RE | Admit: 2014-08-22 | Discharge: 2014-08-22 | Disposition: A | Discharge status: Home / Self Care | Payer: Medicare Other | Source: Ambulatory Visit | Attending: Specialist | Admitting: Specialist

## 2014-08-22 DIAGNOSIS — M5442 Lumbago with sciatica, left side: Secondary | ICD-10-CM

## 2014-08-22 MED ORDER — DIAZEPAM 5 MG PO TABS
10.0000 mg | ORAL_TABLET | Freq: Once | ORAL | Status: AC
  Administered 2014-08-22: 10 mg via ORAL

## 2014-08-22 MED ORDER — IOHEXOL 180 MG/ML  SOLN
15.0000 mL | Freq: Once | INTRAMUSCULAR | Status: AC | PRN
  Administered 2014-08-22: 15 mL via INTRATHECAL

## 2014-08-22 NOTE — Progress Notes (Signed)
1430 pt up to get dressed, son has not arrived.  Pt called son and he will arrive here about 1505.

## 2014-08-22 NOTE — Progress Notes (Signed)
Pt states she has been off Lithium for the past 2 days. Discharge instructions explained to pt.

## 2014-08-22 NOTE — Discharge Instructions (Signed)
Myelogram Discharge Instructions  1. Go home and rest quietly for the next 24 hours.  It is important to lie flat for the next 24 hours.  Get up only to go to the restroom.  You may lie in the bed or on a couch on your back, your stomach, your left side or your right side.  You may have one pillow under your head.  You may have pillows between your knees while you are on your side or under your knees while you are on your back.  2. DO NOT drive today.  Recline the seat as far back as it will go, while still wearing your seat belt, on the way home.  3. You may get up to go to the bathroom as needed.  You may sit up for 10 minutes to eat.  You may resume your normal diet and medications unless otherwise indicated.  Drink lots of extra fluids today and tomorrow.  4. The incidence of headache, nausea, or vomiting is about 5% (one in 20 patients).  If you develop a headache, lie flat and drink plenty of fluids until the headache goes away.  Caffeinated beverages may be helpful.  If you develop severe nausea and vomiting or a headache that does not go away with flat bed rest, call [phone].  5. You may resume normal activities after your 24 hours of bed rest is over; however, do not exert yourself strongly or do any heavy lifting tomorrow. If when you get up you have a headache when standing, go back to bed and force fluids for another 24 hours.  6. Call your physician for a follow-up appointment.  The results of your myelogram will be sent directly to your physician by the following day.  7. If you have any questions or if complications develop after you arrive home, please call [phone].  Discharge instructions have been explained to the patient.  The patient, or the person responsible for the patient, fully understands these instructions.      May resume Lithium on Jan. 6, 2016, after 1:00 pm.

## 2014-08-23 ENCOUNTER — Other Ambulatory Visit: Payer: Self-pay | Admitting: Family

## 2014-09-01 ENCOUNTER — Ambulatory Visit: Payer: Medicare Other | Admitting: Family

## 2014-09-01 ENCOUNTER — Telehealth: Payer: Self-pay | Admitting: *Deleted

## 2014-09-01 NOTE — Telephone Encounter (Signed)
Pt did not show for appointment 09/01/14 at 10:30am for follow up.  Charge no show fee?

## 2014-09-01 NOTE — Telephone Encounter (Signed)
Yes

## 2014-09-01 NOTE — Telephone Encounter (Signed)
See note below

## 2014-09-06 ENCOUNTER — Encounter: Payer: Self-pay | Admitting: Family

## 2014-09-07 ENCOUNTER — Telehealth: Payer: Self-pay | Admitting: Family

## 2014-09-07 NOTE — Telephone Encounter (Signed)
Dismissal Letter sent by Certified Mail 00/16/4290  Received the Return Receipt showing someone picked up the Dismissal 09/14/2014

## 2014-10-25 ENCOUNTER — Other Ambulatory Visit: Payer: Self-pay | Admitting: Family

## 2014-11-07 ENCOUNTER — Encounter (HOSPITAL_COMMUNITY): Payer: Self-pay

## 2014-11-07 ENCOUNTER — Emergency Department (HOSPITAL_COMMUNITY)
Admission: EM | Admit: 2014-11-07 | Discharge: 2014-11-07 | Disposition: A | Discharge status: Home / Self Care | Payer: Medicare Other | Attending: Emergency Medicine | Admitting: Emergency Medicine

## 2014-11-07 DIAGNOSIS — J069 Acute upper respiratory infection, unspecified: Secondary | ICD-10-CM | POA: Insufficient documentation

## 2014-11-07 DIAGNOSIS — Y9389 Activity, other specified: Secondary | ICD-10-CM | POA: Diagnosis not present

## 2014-11-07 DIAGNOSIS — F319 Bipolar disorder, unspecified: Secondary | ICD-10-CM | POA: Insufficient documentation

## 2014-11-07 DIAGNOSIS — E538 Deficiency of other specified B group vitamins: Secondary | ICD-10-CM | POA: Diagnosis not present

## 2014-11-07 DIAGNOSIS — Z79899 Other long term (current) drug therapy: Secondary | ICD-10-CM | POA: Diagnosis not present

## 2014-11-07 DIAGNOSIS — T783XXA Angioneurotic edema, initial encounter: Secondary | ICD-10-CM | POA: Insufficient documentation

## 2014-11-07 DIAGNOSIS — Y9289 Other specified places as the place of occurrence of the external cause: Secondary | ICD-10-CM | POA: Insufficient documentation

## 2014-11-07 DIAGNOSIS — X58XXXA Exposure to other specified factors, initial encounter: Secondary | ICD-10-CM | POA: Insufficient documentation

## 2014-11-07 DIAGNOSIS — Z8742 Personal history of other diseases of the female genital tract: Secondary | ICD-10-CM | POA: Insufficient documentation

## 2014-11-07 DIAGNOSIS — J45909 Unspecified asthma, uncomplicated: Secondary | ICD-10-CM | POA: Insufficient documentation

## 2014-11-07 DIAGNOSIS — R220 Localized swelling, mass and lump, head: Secondary | ICD-10-CM | POA: Diagnosis present

## 2014-11-07 DIAGNOSIS — K219 Gastro-esophageal reflux disease without esophagitis: Secondary | ICD-10-CM | POA: Insufficient documentation

## 2014-11-07 DIAGNOSIS — Z791 Long term (current) use of non-steroidal anti-inflammatories (NSAID): Secondary | ICD-10-CM | POA: Diagnosis not present

## 2014-11-07 DIAGNOSIS — F419 Anxiety disorder, unspecified: Secondary | ICD-10-CM | POA: Diagnosis not present

## 2014-11-07 DIAGNOSIS — Z91040 Latex allergy status: Secondary | ICD-10-CM | POA: Insufficient documentation

## 2014-11-07 DIAGNOSIS — Y998 Other external cause status: Secondary | ICD-10-CM | POA: Diagnosis not present

## 2014-11-07 DIAGNOSIS — D649 Anemia, unspecified: Secondary | ICD-10-CM | POA: Insufficient documentation

## 2014-11-07 MED ORDER — PREDNISONE 20 MG PO TABS
60.0000 mg | ORAL_TABLET | Freq: Once | ORAL | Status: AC
  Administered 2014-11-07: 60 mg via ORAL
  Filled 2014-11-07: qty 3

## 2014-11-07 MED ORDER — PREDNISONE 10 MG PO TABS: ORAL_TABLET | ORAL | Status: DC

## 2014-11-07 NOTE — Discharge Instructions (Signed)
Angioedema °Angioedema is a sudden swelling of tissues, often of the skin. It can occur on the face or genitals or in the abdomen or other body parts. The swelling usually develops over a short period and gets better in 24 to 48 hours. It often begins during the night and is found when the person wakes up. The person may also get red, itchy patches of skin (hives). Angioedema can be dangerous if it involves swelling of the air passages.  °Depending on the cause, episodes of angioedema may only happen once, come back in unpredictable patterns, or repeat for several years and then gradually fade away.  °CAUSES  °Angioedema can be caused by an allergic reaction to various triggers. It can also result from nonallergic causes, including reactions to drugs, immune system disorders, viral infections, or an abnormal gene that is passed to you from your parents (hereditary). For some people with angioedema, the cause is unknown.  °Some things that can trigger angioedema include:  °· Foods.   °· Medicines, such as ACE inhibitors, ARBs, nonsteroidal anti-inflammatory agents, or estrogen.   °· Latex.   °· Animal saliva.   °· Insect stings.   °· Dyes used in X-rays.   °· Mild injury.   °· Dental work. °· Surgery. °· Stress.   °· Sudden changes in temperature.   °· Exercise. °SIGNS AND SYMPTOMS  °· Swelling of the skin. °· Hives. If these are present, there is also intense itching. °· Redness in the affected area.   °· Pain in the affected area. °· Swollen lips or tongue. °· Breathing problems. This may happen if the air passages swell. °· Wheezing. °If internal organs are involved, there may be:  °· Nausea.   °· Abdominal pain.   °· Vomiting.   °· Difficulty swallowing.   °· Difficulty passing urine. °DIAGNOSIS  °· Your health care provider will examine the affected area and take a medical and family history. °· Various tests may be done to help determine the cause. Tests may include: °¨ Allergy skin tests to see if the problem  is an allergic reaction.   °¨ Blood tests to check for hereditary angioedema.   °¨ Tests to check for underlying diseases that could cause the condition.   °· A review of your medicines, including over-the-counter medicines, may be done. °TREATMENT  °Treatment will depend on the cause of the angioedema. Possible treatments include:  °· Removal of anything that triggered the condition (such as stopping certain medicines).   °· Medicines to treat symptoms or prevent attacks. Medicines given may include:   °¨ Antihistamines.   °¨ Epinephrine injection.   °¨ Steroids.   °· Hospitalization may be required for severe attacks. If the air passages are affected, it can be an emergency. Tubes may need to be placed to keep the airway open. °HOME CARE INSTRUCTIONS  °· Take all medicines as directed by your health care provider. °· If you were given medicines for emergency allergy treatment, always carry them with you. °· Wear a medical bracelet as directed by your health care provider.   °· Avoid known triggers. °SEEK MEDICAL CARE IF:  °· You have repeat attacks of angioedema.   °· Your attacks are more frequent or more severe despite preventive measures.   °· You have hereditary angioedema and are considering having children. It is important to discuss with your health care provider the risks of passing the condition on to your children. °SEEK IMMEDIATE MEDICAL CARE IF:  °· You have severe swelling of the mouth, tongue, or lips. °· You have difficulty breathing.   °· You have difficulty swallowing.   °· You faint. °MAKE   SURE YOU: °· Understand these instructions. °· Will watch your condition. °· Will get help right away if you are not doing well or get worse. °Document Released: 10/13/2001 Document Revised: 12/19/2013 Document Reviewed: 03/28/2013 °ExitCare® Patient Information ©2015 [employer]. This information is not intended to replace advice given to you by your health care provider. Make sure you discuss any questions  you have with your health care provider. ° °

## 2014-11-07 NOTE — ED Notes (Signed)
Called for room x2 with no response. 

## 2014-11-07 NOTE — ED Notes (Addendum)
Pt has been having cold sx for the past 10 days and then woke up this morning and her lower lip was swollen. Started bothering her last night. Pt also reports sore throat and decreased appetite.

## 2014-11-07 NOTE — ED Provider Notes (Signed)
CSN: [id number]     Arrival date & time 11/07/14  1453 History   First MD Initiated Contact with Patient 11/07/14 1756     Chief Complaint  Patient presents with  . URI  . Sore Throat  . Oral Swelling     (Consider location/radiation/quality/duration/timing/severity/associated sxs/prior Treatment) Patient is a 54 y.o. female presenting with URI and pharyngitis. The history is provided by the patient. No language interpreter was used.  URI Presenting symptoms: congestion, cough and sore throat   Severity:  Moderate Onset quality:  Gradual Duration:  10 days Timing:  Constant Progression:  Improving Relieved by:  Nothing Worsened by:  Nothing tried Ineffective treatments:  None tried Risk factors: sick contacts   Sore Throat Associated symptoms include congestion, coughing and a sore throat.  Pt has been taking mucinex.  Pt reports lips began swelling for 2 days.   Pt reports it has gone down some now.   Past Medical History  Diagnosis Date  . Bipolar affective disorder 1985    ?nervous breakdown  . Thyroid disease     ? overactive thyroid  . Breast tumor   . B12 DEFICIENCY   . ANEMIA   . INTERSTITIAL LUNG DISEASE   . GASTROESOPHAGEAL REFLUX DISEASE   . BREAST CYST, LEFT   . GOITER   . Syncope   . Asthma   . H/O cardiovascular stress test     Lex MV 4/14:  No ischemia, EF 72%, normal wall motion  . Hyperthyroidism   . Anxiety    Past Surgical History  Procedure Laterality Date  . Tubal ligation  1985, 1996  . Gallbladder surgery  1985    stones removed  . Mediastinoscopy  2001  . Sternotomy  2001  . Bunionectomy      bilateral  . Abdominal hysterectomy  06/03/10  . Knee surgery  1987    bilateral  . Tonsillectomy    . Lumbar laminectomy/decompression microdiscectomy Right 01/13/2013    Procedure: MICRODISCECTOMY FORAMINOTOMY OF L4-5 ON THE RIGHT;  Surgeon: Magnus Sinning, MD;  Location: [facility];  Service: Orthopedics;  Laterality: Right;   Family History   Problem Relation Age of Onset  . Diabetes Mother   . Hypertension Mother   . Cancer Father     ?type  . Hypertension Father    History  Substance Use Topics  . Smoking status: Never Smoker   . Smokeless tobacco: Never Used  . Alcohol Use: No   OB History    No data available     Review of Systems  HENT: Positive for congestion and sore throat.   Respiratory: Positive for cough.   All other systems reviewed and are negative.     Allergies  Adhesive and Latex  Home Medications   Prior to Admission medications   Medication Sig Start Date End Date Taking? Authorizing Provider  albuterol (PROVENTIL HFA;VENTOLIN HFA) 108 (90 BASE) MCG/ACT inhaler Inhale 2 puffs into the lungs every 6 (six) hours as needed for wheezing or shortness of breath.    Yes Historical Provider, MD  amLODipine (NORVASC) 5 MG tablet take 1 tablet by mouth once daily 08/23/14  Yes Debbrah Alar, NP  colchicine 0.6 MG tablet Take 0.6 mg by mouth daily as needed. Two tabs by mouth at start of gout attack.  Take 1 tablet one hour later. 06/21/13  Yes Debbrah Alar, NP  estradiol (ESTRACE) 2 MG tablet take 1 tablet by mouth once daily 08/23/14  Yes Debbrah Alar,  NP  folic acid (FOLVITE) 1 MG tablet take 1 tablet by mouth once daily 10/24/13  Yes Debbrah Alar, NP  lithium 300 MG tablet Take 1 tablet every morning and take 2 tablets every night at bedtime.   Yes Historical Provider, MD  pantoprazole (PROTONIX) 40 MG tablet Take 1 tablet (40 mg total) by mouth daily. 06/21/13  Yes Debbrah Alar, NP  QUEtiapine (SEROQUEL) 400 MG tablet Take 300 mg by mouth at bedtime.    Yes Historical Provider, MD  cyclobenzaprine (FLEXERIL) 10 MG tablet Take 1 tablet (10 mg total) by mouth 2 (two) times daily as needed for muscle spasms. Patient not taking: Reported on 11/07/2014 10/15/13   Ignacia Felling, PA-C  HYDROcodone-acetaminophen (NORCO/VICODIN) 5-325 MG per tablet Take 1 tablet by mouth every 4 (four)  hours as needed. Patient not taking: Reported on 11/07/2014 10/15/13   Ignacia Felling, PA-C  meloxicam (MOBIC) 7.5 MG tablet Take 1 tablet (7.5 mg total) by mouth daily. Patient not taking: Reported on 11/07/2014 10/28/13   Debbrah Alar, NP   BP 128/83 mmHg  Pulse 81  Temp(Src) 98.3 F (36.8 C)  Resp 18  Ht 5\' 4"  (1.626 m)  Wt 176 lb (79.833 kg)  BMI 30.20 kg/m2  SpO2 99%  LMP 04/18/2010 Physical Exam  Constitutional: She appears well-developed.  HENT:  Head: Normocephalic.  Mouth/Throat: Oropharynx is clear and moist.  Eyes: Conjunctivae are normal. Pupils are equal, round, and reactive to light.  Neck: Normal range of motion. Neck supple.  Cardiovascular: Normal rate and normal heart sounds.   Pulmonary/Chest: Effort normal.  Abdominal: Soft.  Musculoskeletal: Normal range of motion.  Neurological: She is alert.  Skin: Skin is warm.  Psychiatric: She has a normal mood and affect.  Nursing note and vitals reviewed.   ED Course  Procedures (including critical care time) Labs Review Labs Reviewed - No data to display  Imaging Review No results found.   EKG Interpretation None      MDM  Pt gives a good description of angioedema.  No cute swelling.   I advised stop mucinex.   Rx for prednisone.  Pt advised to follow up with her primary MD for recheck   Final diagnoses:  Lip swelling  URI (upper respiratory infection)       Fransico Meadow, PA-C 11/07/14 [place], DO 11/09/14 1755

## 2014-12-21 ENCOUNTER — Other Ambulatory Visit: Payer: Self-pay | Admitting: Internal Medicine

## 2014-12-21 ENCOUNTER — Other Ambulatory Visit: Payer: Self-pay | Admitting: Family Medicine

## 2014-12-21 DIAGNOSIS — N6320 Unspecified lump in the left breast, unspecified quadrant: Secondary | ICD-10-CM

## 2014-12-21 DIAGNOSIS — Z803 Family history of malignant neoplasm of breast: Secondary | ICD-10-CM

## 2014-12-27 ENCOUNTER — Ambulatory Visit
Admission: RE | Admit: 2014-12-27 | Discharge: 2014-12-27 | Disposition: A | Discharge status: Home / Self Care | Payer: Medicare Other | Source: Ambulatory Visit | Attending: Internal Medicine | Admitting: Internal Medicine

## 2014-12-27 DIAGNOSIS — Z803 Family history of malignant neoplasm of breast: Secondary | ICD-10-CM

## 2014-12-27 DIAGNOSIS — N6320 Unspecified lump in the left breast, unspecified quadrant: Secondary | ICD-10-CM

## 2015-07-17 ENCOUNTER — Encounter (HOSPITAL_COMMUNITY): Payer: Self-pay | Admitting: Emergency Medicine

## 2015-07-17 ENCOUNTER — Emergency Department (HOSPITAL_COMMUNITY): Payer: Medicare Other

## 2015-07-17 ENCOUNTER — Emergency Department (HOSPITAL_COMMUNITY)
Admission: EM | Admit: 2015-07-17 | Discharge: 2015-07-17 | Disposition: A | Discharge status: Home / Self Care | Payer: Medicare Other | Attending: Physician Assistant | Admitting: Physician Assistant

## 2015-07-17 DIAGNOSIS — S6992XA Unspecified injury of left wrist, hand and finger(s), initial encounter: Secondary | ICD-10-CM | POA: Insufficient documentation

## 2015-07-17 DIAGNOSIS — Z79899 Other long term (current) drug therapy: Secondary | ICD-10-CM | POA: Diagnosis not present

## 2015-07-17 DIAGNOSIS — F419 Anxiety disorder, unspecified: Secondary | ICD-10-CM | POA: Diagnosis not present

## 2015-07-17 DIAGNOSIS — Y9289 Other specified places as the place of occurrence of the external cause: Secondary | ICD-10-CM | POA: Diagnosis not present

## 2015-07-17 DIAGNOSIS — Z91040 Latex allergy status: Secondary | ICD-10-CM | POA: Insufficient documentation

## 2015-07-17 DIAGNOSIS — Z9889 Other specified postprocedural states: Secondary | ICD-10-CM | POA: Insufficient documentation

## 2015-07-17 DIAGNOSIS — Z79818 Long term (current) use of other agents affecting estrogen receptors and estrogen levels: Secondary | ICD-10-CM | POA: Insufficient documentation

## 2015-07-17 DIAGNOSIS — S79912A Unspecified injury of left hip, initial encounter: Secondary | ICD-10-CM | POA: Diagnosis not present

## 2015-07-17 DIAGNOSIS — W19XXXA Unspecified fall, initial encounter: Secondary | ICD-10-CM

## 2015-07-17 DIAGNOSIS — R0781 Pleurodynia: Secondary | ICD-10-CM

## 2015-07-17 DIAGNOSIS — D649 Anemia, unspecified: Secondary | ICD-10-CM | POA: Diagnosis not present

## 2015-07-17 DIAGNOSIS — S199XXA Unspecified injury of neck, initial encounter: Secondary | ICD-10-CM | POA: Insufficient documentation

## 2015-07-17 DIAGNOSIS — R6884 Jaw pain: Secondary | ICD-10-CM

## 2015-07-17 DIAGNOSIS — S0993XA Unspecified injury of face, initial encounter: Secondary | ICD-10-CM | POA: Insufficient documentation

## 2015-07-17 DIAGNOSIS — F319 Bipolar disorder, unspecified: Secondary | ICD-10-CM | POA: Insufficient documentation

## 2015-07-17 DIAGNOSIS — M25532 Pain in left wrist: Secondary | ICD-10-CM

## 2015-07-17 DIAGNOSIS — Y998 Other external cause status: Secondary | ICD-10-CM | POA: Diagnosis not present

## 2015-07-17 DIAGNOSIS — S29001A Unspecified injury of muscle and tendon of front wall of thorax, initial encounter: Secondary | ICD-10-CM | POA: Insufficient documentation

## 2015-07-17 DIAGNOSIS — M25552 Pain in left hip: Secondary | ICD-10-CM

## 2015-07-17 DIAGNOSIS — Z86018 Personal history of other benign neoplasm: Secondary | ICD-10-CM | POA: Diagnosis not present

## 2015-07-17 DIAGNOSIS — W07XXXA Fall from chair, initial encounter: Secondary | ICD-10-CM | POA: Insufficient documentation

## 2015-07-17 DIAGNOSIS — J45909 Unspecified asthma, uncomplicated: Secondary | ICD-10-CM | POA: Insufficient documentation

## 2015-07-17 DIAGNOSIS — K219 Gastro-esophageal reflux disease without esophagitis: Secondary | ICD-10-CM | POA: Diagnosis not present

## 2015-07-17 DIAGNOSIS — Z8639 Personal history of other endocrine, nutritional and metabolic disease: Secondary | ICD-10-CM | POA: Diagnosis not present

## 2015-07-17 DIAGNOSIS — Y9389 Activity, other specified: Secondary | ICD-10-CM | POA: Diagnosis not present

## 2015-07-17 DIAGNOSIS — Z7952 Long term (current) use of systemic steroids: Secondary | ICD-10-CM | POA: Insufficient documentation

## 2015-07-17 MED ORDER — METHOCARBAMOL 500 MG PO TABS: 500.0000 mg | ORAL_TABLET | Freq: Two times a day (BID) | ORAL | Status: DC

## 2015-07-17 MED ORDER — HYDROCODONE-ACETAMINOPHEN 5-325 MG PO TABS
2.0000 | ORAL_TABLET | Freq: Once | ORAL | Status: AC
  Administered 2015-07-17: 2 via ORAL
  Filled 2015-07-17: qty 2

## 2015-07-17 MED ORDER — MELOXICAM 15 MG PO TABS: 15.0000 mg | ORAL_TABLET | Freq: Every day | ORAL | Status: DC

## 2015-07-17 NOTE — Discharge Instructions (Signed)
Take mobic as needed for pain. Take robaxin as needed for muscle spasm. You may take these medications together. Refer to attached documents for more information.

## 2015-07-17 NOTE — ED Notes (Addendum)
Pt states she fell out of a rolling chair on Friday and landed on her left side. Pt denies LOC. Pt c/o pain in left hip and left jaw. Pt also reports pain on right and left side of neck pt states it feels, "stiff". Pt is ambulatory and appears in no acute distress.

## 2015-07-17 NOTE — ED Provider Notes (Signed)
CSN: [id number]     Arrival date & time 07/17/15  1029 History  By signing my name below, I, Evelene Croon, attest that this documentation has been prepared under the direction and in the presence of non-physician practitioner, Alvina Chou, PA-C. Electronically Signed: Evelene Croon, Scribe. 07/17/2015. 1:36 PM.   Chief Complaint  Patient presents with  . Fall   The history is provided by the patient. No language interpreter was used.    HPI Comments:  Lynn Gonzalez is a 54 y.o. female with a history of back surgery, who presents to the Emergency Department s/p fall 5 days ago complaining of jaw pain, left wrist pain, hip pain and neck pain following the incident. She has taken aleve with minimal relief. Pt states she fell forward out of rolling chair when she bent over to pick something up; notes the chair fell on top of her. Pt states she struck her head but denies LOC and HA.   Past Medical History  Diagnosis Date  . Bipolar affective disorder ([place]) 1985    ?nervous breakdown  . Thyroid disease     ? overactive thyroid  . Breast tumor   . B12 DEFICIENCY   . ANEMIA   . INTERSTITIAL LUNG DISEASE   . GASTROESOPHAGEAL REFLUX DISEASE   . BREAST CYST, LEFT   . GOITER   . Syncope   . Asthma   . H/O cardiovascular stress test     Lex MV 4/14:  No ischemia, EF 72%, normal wall motion  . Hyperthyroidism   . Anxiety    Past Surgical History  Procedure Laterality Date  . Tubal ligation  1985, 1996  . Gallbladder surgery  1985    stones removed  . Mediastinoscopy  2001  . Sternotomy  2001  . Bunionectomy      bilateral  . Abdominal hysterectomy  06/03/10  . Knee surgery  1987    bilateral  . Tonsillectomy    . Lumbar laminectomy/decompression microdiscectomy Right 01/13/2013    Procedure: MICRODISCECTOMY FORAMINOTOMY OF L4-5 ON THE RIGHT;  Surgeon: Magnus Sinning, MD;  Location: [facility];  Service: Orthopedics;  Laterality: Right;   Family History  Problem  Relation Age of Onset  . Diabetes Mother   . Hypertension Mother   . Cancer Father     ?type  . Hypertension Father    Social History  Substance Use Topics  . Smoking status: Never Smoker   . Smokeless tobacco: Never Used  . Alcohol Use: No   OB History    No data available     Review of Systems  Constitutional: Negative for fever and chills.  Respiratory: Negative for shortness of breath.   Cardiovascular: Negative for chest pain.  Musculoskeletal: Positive for myalgias, arthralgias and neck pain.       Jaw pain, hip pain    Allergies  Adhesive and Latex  Home Medications   Prior to Admission medications   Medication Sig Start Date End Date Taking? Authorizing Provider  albuterol (PROVENTIL HFA;VENTOLIN HFA) 108 (90 BASE) MCG/ACT inhaler Inhale 2 puffs into the lungs every 6 (six) hours as needed for wheezing or shortness of breath.     Historical Provider, MD  amLODipine (NORVASC) 5 MG tablet take 1 tablet by mouth once daily 08/23/14   Debbrah Alar, NP  colchicine 0.6 MG tablet Take 0.6 mg by mouth daily as needed. Two tabs by mouth at start of gout attack.  Take 1 tablet one hour later. 06/21/13  Debbrah Alar, NP  cyclobenzaprine (FLEXERIL) 10 MG tablet Take 1 tablet (10 mg total) by mouth 2 (two) times daily as needed for muscle spasms. Patient not taking: Reported on 11/07/2014 10/15/13   Ignacia Felling, PA-C  estradiol (ESTRACE) 2 MG tablet take 1 tablet by mouth once daily 08/23/14   Debbrah Alar, NP  folic acid (FOLVITE) 1 MG tablet take 1 tablet by mouth once daily 10/24/13   Debbrah Alar, NP  HYDROcodone-acetaminophen (NORCO/VICODIN) 5-325 MG per tablet Take 1 tablet by mouth every 4 (four) hours as needed. Patient not taking: Reported on 11/07/2014 10/15/13   Ignacia Felling, PA-C  lithium 300 MG tablet Take 1 tablet every morning and take 2 tablets every night at bedtime.    Historical Provider, MD  meloxicam (MOBIC) 7.5 MG tablet Take 1 tablet  (7.5 mg total) by mouth daily. Patient not taking: Reported on 11/07/2014 10/28/13   Debbrah Alar, NP  pantoprazole (PROTONIX) 40 MG tablet Take 1 tablet (40 mg total) by mouth daily. 06/21/13   Debbrah Alar, NP  predniSONE (DELTASONE) 10 MG tablet 5,4,3,2,1 taper 11/07/14   Fransico Meadow, PA-C  QUEtiapine (SEROQUEL) 400 MG tablet Take 300 mg by mouth at bedtime.     Historical Provider, MD   BP 118/82 mmHg  Pulse 71  Temp(Src) 97.7 F (36.5 C) (Oral)  Resp 18  Ht 5\' 6"  (1.676 m)  Wt 176 lb (79.833 kg)  BMI 28.42 kg/m2  SpO2 97%  LMP 04/18/2010 Physical Exam  Constitutional: She is oriented to person, place, and time. She appears well-developed and well-nourished. No distress.  HENT:  Head: Normocephalic and atraumatic.  Trismus noted with left mandible TTP  Eyes: Conjunctivae and EOM are normal.  Neck: Normal range of motion. Neck supple.  Cardiovascular: Normal rate.   Pulmonary/Chest: Effort normal and breath sounds normal. No respiratory distress. She has no wheezes.  Abdominal: Soft. She exhibits no distension. There is no tenderness.  Musculoskeletal:  Midline cervical spine TTP  Left lateral rib TTP  Left lateral hip TTP No joint deformity, open wounds, or bruises noted    Neurological: She is alert and oriented to person, place, and time. Coordination normal.  Skin: Skin is warm and dry.  Psychiatric: She has a normal mood and affect. Her behavior is normal.  Nursing note and vitals reviewed.   ED Course  Procedures  DIAGNOSTIC STUDIES:  Oxygen Saturation is 98% on RA, normal by my interpretation.    COORDINATION OF CARE:  11:39 AM Discussed treatment plan with pt at bedside and pt agreed to plan.   Imaging Review Dg Ribs Unilateral W/chest Left  07/17/2015  CLINICAL DATA:  Fall EXAM: LEFT RIBS AND CHEST - 3+ VIEW COMPARISON:  Chest x-ray 09/02/2012 FINDINGS: Median sternotomy. Heart size upper normal. Mild vascular congestion without edema.  Prominent lung markings are present especially the right lung apex compatible with scarring. Negative for left rib fracture. No effusion or pneumothorax on the left. IMPRESSION: Chronic lung disease with scarring. Negative for left rib fracture. Electronically Signed   By: Franchot Gallo M.D.   On: 07/17/2015 13:11   Dg Cervical Spine Complete  07/17/2015  CLINICAL DATA:  Fall out of chair with posterior neck pain. Initial encounter. EXAM: CERVICAL SPINE - COMPLETE 4+ VIEW COMPARISON:  Cervical spine CT 10/15/2013 FINDINGS: There is no evidence of cervical spine fracture or prevertebral soft tissue swelling. Alignment is normal. Spondylotic spurring at C5-6. No focal or advanced disc narrowing. Facet arthropathy focally on  the right at C3-4, with spurring and subchondral irregularity. IMPRESSION: No evidence of cervical spine injury. Electronically Signed   By: Monte Fantasia M.D.   On: 07/17/2015 13:06   Dg Wrist Complete Left  07/17/2015  CLINICAL DATA:  54 year old female with history of trauma after falling out of a chair complaining of distal left forearm pain. EXAM: LEFT WRIST - COMPLETE 3+ VIEW COMPARISON:  No priors. FINDINGS: Multiple views of the left wrist demonstrate no acute displaced fracture, subluxation, dislocation, or soft tissue abnormality. IMPRESSION: No acute radiographic abnormality of the left wrist. Electronically Signed   By: Vinnie Langton M.D.   On: 07/17/2015 13:05   Dg Hip Unilat With Pelvis 2-3 Views Left  07/17/2015  CLINICAL DATA:  [place] from chair.  Left hip pain EXAM: DG HIP (WITH OR WITHOUT PELVIS) 2-3V LEFT COMPARISON:  None. FINDINGS: There is no evidence of hip fracture or dislocation. There is no evidence of arthropathy or other focal bone abnormality. IMPRESSION: Negative. Electronically Signed   By: Franchot Gallo M.D.   On: 07/17/2015 13:04   Ct Maxillofacial Wo Cm  07/17/2015  CLINICAL DATA:  Fall this morning, hit left side of the face EXAM: CT  MAXILLOFACIAL WITHOUT CONTRAST TECHNIQUE: Multidetector CT imaging of the maxillofacial structures was performed. Multiplanar CT image reconstructions were also generated. A small metallic BB was placed on the right temple in order to reliably differentiate right from left. COMPARISON:  10/15/2013 FINDINGS: Axial images shows no acute fractures. Sagittal images shows no maxillary spine fracture. The nasopharyngeal and oropharyngeal airway is patent. Degenerative changes are noted C1-C2 articulation. Anterior spurring noted lower endplate of C5 and C6 vertebral body. No paranasal sinuses air-fluid levels. No nasal bone fracture. There is no zygomatic fracture. Coronal images shows no orbital floor or orbital rim fracture. There is left deviation of nasal bony septum. The nasal turbinates are unremarkable. Bilateral patent semilunar canal. No TMJ dislocation. No mandibular fracture is noted. Again noted a edentulous maxilla. No intraorbital hematoma. Bilateral eye globe is symmetrical in appearance. IMPRESSION: 1. No facial fracture facial fluid collection. 2. Patent nasopharyngeal and oropharyngeal airway. No TMJ dislocation. No mandibular fracture. 3. There is left deviation of nasal bony septum. Bilateral semilunar canal is patent. Electronically Signed   By: Lahoma Crocker M.D.   On: 07/17/2015 13:24   I have personally reviewed and evaluated these images and lab results as part of my medical decision-making.    MDM   Final diagnoses:  Fall, initial encounter  Jaw pain  Left wrist pain  Rib pain on left side  Left hip pain    1:40 PM Imaging unremarkable for acute changes. Patient will be discharged with mobic and robaxin for pain. Vitals stable and patient afebrile. No further evaluation needed at this time.   I personally performed the services described in this documentation, which was scribed in my presence. The recorded information has been reviewed and is accurate.    Alvina Chou,  PA-C 07/17/15 [place], MD 07/17/15 1531

## 2015-12-18 ENCOUNTER — Emergency Department (HOSPITAL_COMMUNITY)
Admission: EM | Admit: 2015-12-18 | Discharge: 2015-12-18 | Disposition: A | Discharge status: Home / Self Care | Payer: Medicare Other | Attending: Emergency Medicine | Admitting: Emergency Medicine

## 2015-12-18 ENCOUNTER — Encounter (HOSPITAL_COMMUNITY): Payer: Self-pay | Admitting: Emergency Medicine

## 2015-12-18 ENCOUNTER — Emergency Department (HOSPITAL_COMMUNITY): Payer: Medicare Other

## 2015-12-18 ENCOUNTER — Emergency Department (EMERGENCY_DEPARTMENT_HOSPITAL)
Admit: 2015-12-18 | Discharge: 2015-12-18 | Disposition: A | Discharge status: Home / Self Care | Payer: Medicare Other | Attending: Emergency Medicine | Admitting: Emergency Medicine

## 2015-12-18 DIAGNOSIS — F419 Anxiety disorder, unspecified: Secondary | ICD-10-CM | POA: Diagnosis not present

## 2015-12-18 DIAGNOSIS — R079 Chest pain, unspecified: Secondary | ICD-10-CM | POA: Diagnosis present

## 2015-12-18 DIAGNOSIS — Z79899 Other long term (current) drug therapy: Secondary | ICD-10-CM | POA: Insufficient documentation

## 2015-12-18 DIAGNOSIS — J45909 Unspecified asthma, uncomplicated: Secondary | ICD-10-CM | POA: Insufficient documentation

## 2015-12-18 DIAGNOSIS — R42 Dizziness and giddiness: Secondary | ICD-10-CM | POA: Insufficient documentation

## 2015-12-18 DIAGNOSIS — M79661 Pain in right lower leg: Secondary | ICD-10-CM

## 2015-12-18 DIAGNOSIS — Z853 Personal history of malignant neoplasm of breast: Secondary | ICD-10-CM | POA: Insufficient documentation

## 2015-12-18 DIAGNOSIS — I10 Essential (primary) hypertension: Secondary | ICD-10-CM | POA: Diagnosis not present

## 2015-12-18 DIAGNOSIS — Z8639 Personal history of other endocrine, nutritional and metabolic disease: Secondary | ICD-10-CM | POA: Insufficient documentation

## 2015-12-18 DIAGNOSIS — R5383 Other fatigue: Secondary | ICD-10-CM | POA: Diagnosis not present

## 2015-12-18 DIAGNOSIS — K219 Gastro-esophageal reflux disease without esophagitis: Secondary | ICD-10-CM | POA: Insufficient documentation

## 2015-12-18 DIAGNOSIS — Z79818 Long term (current) use of other agents affecting estrogen receptors and estrogen levels: Secondary | ICD-10-CM | POA: Insufficient documentation

## 2015-12-18 DIAGNOSIS — D649 Anemia, unspecified: Secondary | ICD-10-CM | POA: Insufficient documentation

## 2015-12-18 DIAGNOSIS — F319 Bipolar disorder, unspecified: Secondary | ICD-10-CM | POA: Diagnosis not present

## 2015-12-18 DIAGNOSIS — Z91040 Latex allergy status: Secondary | ICD-10-CM | POA: Insufficient documentation

## 2015-12-18 DIAGNOSIS — R0789 Other chest pain: Secondary | ICD-10-CM | POA: Diagnosis not present

## 2015-12-18 DIAGNOSIS — R1110 Vomiting, unspecified: Secondary | ICD-10-CM | POA: Diagnosis not present

## 2015-12-18 DIAGNOSIS — Z791 Long term (current) use of non-steroidal anti-inflammatories (NSAID): Secondary | ICD-10-CM | POA: Diagnosis not present

## 2015-12-18 LAB — COMPREHENSIVE METABOLIC PANEL
ALBUMIN: 3.6 g/dL (ref 3.5–5.0)
ALK PHOS: 76 U/L (ref 38–126)
ALT: 11 U/L — ABNORMAL LOW (ref 14–54)
ANION GAP: 9 (ref 5–15)
AST: 22 U/L (ref 15–41)
BUN: 5 mg/dL — ABNORMAL LOW (ref 6–20)
CALCIUM: 9.4 mg/dL (ref 8.9–10.3)
CO2: 23 mmol/L (ref 22–32)
Chloride: 107 mmol/L (ref 101–111)
Creatinine, Ser: 0.87 mg/dL (ref 0.44–1.00)
GFR calc non Af Amer: >60 mL/min (ref >60)
GLUCOSE: 113 mg/dL — AB (ref 65–99)
POTASSIUM: 4 mmol/L (ref 3.5–5.1)
Sodium: 139 mmol/L (ref 135–145)
TOTAL PROTEIN: 7 g/dL (ref 6.5–8.1)
Total Bilirubin: 0.4 mg/dL (ref 0.3–1.2)

## 2015-12-18 LAB — CBC
HCT: 39.1 % (ref 36.0–46.0)
HEMOGLOBIN: 11.9 g/dL — AB (ref 12.0–15.0)
MCH: 26.3 pg (ref 26.0–34.0)
MCHC: 30.4 g/dL (ref 30.0–36.0)
MCV: 86.3 fL (ref 78.0–100.0)
PLATELETS: 197 10*3/uL (ref 150–400)
RBC: 4.53 MIL/uL (ref 3.87–5.11)
RDW: 13.8 % (ref 11.5–15.5)
WBC: 7.7 10*3/uL (ref 4.0–10.5)

## 2015-12-18 LAB — I-STAT TROPONIN, ED: TROPONIN I, POC: 0 ng/mL (ref 0.00–0.08)

## 2015-12-18 LAB — LIPASE, BLOOD: Lipase: 38 U/L (ref 11–51)

## 2015-12-18 MED ORDER — IOPAMIDOL (ISOVUE-370) INJECTION 76%
INTRAVENOUS | Status: AC
  Administered 2015-12-18: 100 mL
  Filled 2015-12-18: qty 100

## 2015-12-18 MED ORDER — ONDANSETRON HCL 4 MG/2ML IJ SOLN
4.0000 mg | Freq: Once | INTRAMUSCULAR | Status: AC
  Administered 2015-12-18: 4 mg via INTRAVENOUS
  Filled 2015-12-18: qty 2

## 2015-12-18 MED ORDER — MORPHINE SULFATE (PF) 4 MG/ML IV SOLN
4.0000 mg | INTRAVENOUS | Status: DC | PRN
  Administered 2015-12-18: 4 mg via INTRAVENOUS
  Filled 2015-12-18: qty 1

## 2015-12-18 MED ORDER — ONDANSETRON HCL 4 MG PO TABS: 4.0000 mg | ORAL_TABLET | Freq: Four times a day (QID) | ORAL | Status: DC

## 2015-12-18 NOTE — ED Notes (Signed)
Pt reports vomiting x 3 days and CP starting last night with no other associated symptoms. Pt alert x4. NAD at this time.

## 2015-12-18 NOTE — ED Provider Notes (Signed)
CSN: [id number]     Arrival date & time 12/18/15  1458 History   First MD Initiated Contact with Patient 12/18/15 1550     Chief Complaint  Patient presents with  . Chest Pain     (Consider location/radiation/quality/duration/timing/severity/associated sxs/prior Treatment) Patient is a 55 y.o. female presenting with chest pain. The history is provided by the patient.  Chest Pain Associated symptoms: cough, dizziness, fatigue and vomiting   Associated symptoms: no abdominal pain, no dysphagia, no fever, no headache, no nausea, no shortness of breath and no weakness    Patient is a 55 year old female with a past medical history of anemia, GERD, left breast cyst, hypertension, hormone replacement therapy presents to the ED with chest pain since last night. She states the pain is left-sided and in her sternum, sharp, constant, 9/10, nonradiating and she is not taking anything for the pain. She endorses 2 episodes of vomiting this morning and intermittent dizziness. Chest pain is not worse with inspiration. She states GI bug with vomiting for one week and cold symptoms for 5 days of productive cough, runny nose, sneezing. She states she's been homebound for 1 week due to these illnesses. She has a history of hypertension she states she did not take her medications today. She denies dyspnea or shortness of breath, headache, syncope, visual changes, hearing changes.   Past Medical History  Diagnosis Date  . Bipolar affective disorder ([place]) 1985    ?nervous breakdown  . Thyroid disease     ? overactive thyroid  . Breast tumor   . B12 DEFICIENCY   . ANEMIA   . INTERSTITIAL LUNG DISEASE   . GASTROESOPHAGEAL REFLUX DISEASE   . BREAST CYST, LEFT   . GOITER   . Syncope   . Asthma   . H/O cardiovascular stress test     Lex MV 4/14:  No ischemia, EF 72%, normal wall motion  . Hyperthyroidism   . Anxiety    Past Surgical History  Procedure Laterality Date  . Tubal ligation  1985, 1996  .  Gallbladder surgery  1985    stones removed  . Mediastinoscopy  2001  . Sternotomy  2001  . Bunionectomy      bilateral  . Abdominal hysterectomy  06/03/10  . Knee surgery  1987    bilateral  . Tonsillectomy    . Lumbar laminectomy/decompression microdiscectomy Right 01/13/2013    Procedure: MICRODISCECTOMY FORAMINOTOMY OF L4-5 ON THE RIGHT;  Surgeon: Magnus Sinning, MD;  Location: [facility];  Service: Orthopedics;  Laterality: Right;   Family History  Problem Relation Age of Onset  . Diabetes Mother   . Hypertension Mother   . Cancer Father     ?type  . Hypertension Father    Social History  Substance Use Topics  . Smoking status: Never Smoker   . Smokeless tobacco: Never Used  . Alcohol Use: No   OB History    No data available     Review of Systems  Constitutional: Positive for fatigue. Negative for fever and chills.  HENT: Negative for congestion, hearing loss and trouble swallowing.   Eyes: Negative for pain, discharge and visual disturbance.  Respiratory: Positive for cough. Negative for chest tightness, shortness of breath and wheezing.   Cardiovascular: Positive for chest pain. Negative for leg swelling.  Gastrointestinal: Positive for vomiting. Negative for nausea, abdominal pain, diarrhea, blood in stool and abdominal distention.  Genitourinary: Negative for dysuria, hematuria and vaginal discharge.  Musculoskeletal: Negative for neck stiffness.  Skin: Negative for rash.  Neurological: Positive for dizziness. Negative for syncope, weakness and headaches.  Psychiatric/Behavioral: Negative for confusion and agitation.      Allergies  Adhesive and Latex  Home Medications   Prior to Admission medications   Medication Sig Start Date End Date Taking? Authorizing Provider  albuterol (PROVENTIL HFA;VENTOLIN HFA) 108 (90 BASE) MCG/ACT inhaler Inhale 2 puffs into the lungs every 6 (six) hours as needed for wheezing or shortness of breath.    Yes Historical  Provider, MD  amLODipine (NORVASC) 5 MG tablet take 1 tablet by mouth once daily 08/23/14  Yes Debbrah Alar, NP  colchicine 0.6 MG tablet Take 0.6 mg by mouth daily as needed. Two tabs by mouth at start of gout attack.  Take 1 tablet one hour later. 06/21/13  Yes Debbrah Alar, NP  estradiol (ESTRACE) 2 MG tablet take 1 tablet by mouth once daily 08/23/14  Yes Debbrah Alar, NP  lithium 300 MG tablet Take 1 tablet every morning and take 2 tablets every night at bedtime.   Yes Historical Provider, MD  meloxicam (MOBIC) 15 MG tablet Take 1 tablet (15 mg total) by mouth daily. 07/17/15  Yes Kaitlyn Szekalski, PA-C  QUEtiapine (SEROQUEL) 300 MG tablet Take 300 mg by mouth at bedtime.   Yes Historical Provider, MD  QUEtiapine (SEROQUEL) 400 MG tablet Take 300 mg by mouth at bedtime.    Yes Historical Provider, MD  ranitidine (ZANTAC) 150 MG tablet Take 150 mg by mouth 2 (two) times daily. 12/13/15  Yes Historical Provider, MD  folic acid (FOLVITE) 1 MG tablet take 1 tablet by mouth once daily Patient not taking: Reported on 12/18/2015 10/24/13   Debbrah Alar, NP  ondansetron (ZOFRAN) 4 MG tablet Take 1 tablet (4 mg total) by mouth every 6 (six) hours. 12/18/15   Kynan Peasley L Rayneisha Bouza, PA   BP 143/93 mmHg  Pulse 73  Temp(Src) 98.6 F (37 C) (Oral)  SpO2 96%  LMP 04/18/2010 Physical Exam  Constitutional: She appears well-developed and well-nourished. No distress.  HENT:  Head: Normocephalic and atraumatic.  Mouth/Throat: Oropharynx is clear and moist and mucous membranes are normal.  Eyes: Conjunctivae and EOM are normal. Pupils are equal, round, and reactive to light.  Neck: Normal range of motion. Neck supple.  Cardiovascular: Normal rate, regular rhythm, normal heart sounds and normal pulses.   Pulses:      Radial pulses are 2+ on the right side, and 2+ on the left side.       Dorsalis pedis pulses are 2+ on the right side, and 2+ on the left side.  Pulmonary/Chest: Effort normal and  breath sounds normal. No respiratory distress. She has no wheezes. She exhibits tenderness. She exhibits no mass, no crepitus and no deformity.    Previous well-healed scar of superior sternum  Abdominal: Soft. Bowel sounds are normal. There is no tenderness.  Musculoskeletal: Normal range of motion. She exhibits no edema.  Mild tenderness to palpation of right calf, no tenderness to left calf, no swelling noted to cavles bilaterally  Neurological: She is alert. No sensory deficit. Coordination normal.  Skin: Skin is warm and dry. No rash noted. She is not diaphoretic.  Psychiatric: She has a normal mood and affect. Her behavior is normal.    ED Course  Procedures (including critical care time)  1930: Pt states her chest pain is now 8/10 down from 9/10. She states she just got a headache on the right sided temporal region, constant, non-radiating with  no visual changes or associated symptoms. Will give a motrin for the headache and morphine for her chest pain.  2130: Patient states she is concerned to go home tonight she was by herself. I reassured her that we do not feel her chest pain is cardiac or pulmonic in nature. We will discharge her with Zofran for her nausea.  Labs Review Labs Reviewed  CBC - Abnormal; Notable for the following:    Hemoglobin 11.9 (*)    All other components within normal limits  COMPREHENSIVE METABOLIC PANEL - Abnormal; Notable for the following:    Glucose, Bld 113 (*)    BUN 5 (*)    ALT 11 (*)    All other components within normal limits  LIPASE, BLOOD  URINALYSIS, ROUTINE W REFLEX MICROSCOPIC (NOT AT [facility])  Randolm Idol, ED    Imaging Review Dg Chest 2 View  12/18/2015  CLINICAL DATA:  Mid chest pain with numbness left humerus- stopping at elbow beginning yesterday. HTN - on meds. Hx of asthma; interstitial lung disease EXAM: CHEST  2 VIEW COMPARISON:  07/17/2015 FINDINGS: Stable changes from median sternotomy. Cardiac silhouette is normal in  size and configuration. Normal mediastinal and hilar contours. Stable right upper lobe scarring. No evidence of pneumonia or pulmonary edema. No pleural effusion or pneumothorax. Bony thorax is intact IMPRESSION: No acute cardiopulmonary disease. Electronically Signed   By: Lajean Manes M.D.   On: 12/18/2015 15:24   Ct Angio Chest Pe W/cm &/or Wo Cm  12/18/2015  CLINICAL DATA:  Vomiting and chest pain for the past 3 days. Evaluate for pulmonary embolism. EXAM: CT ANGIOGRAPHY CHEST WITH CONTRAST TECHNIQUE: Multidetector CT imaging of the chest was performed using the standard protocol during bolus administration of intravenous contrast. Multiplanar CT image reconstructions and MIPs were obtained to evaluate the vascular anatomy. CONTRAST:  100 cc Isovue 370 COMPARISON:  Chest radiograph - earlier same day ; chest CTA - 03/30/2012 ; 02/07/2010 FINDINGS: Vascular Findings: There is adequate opacification of the pulmonary arterial system with the main pulmonary artery measuring 385 Hounsfield units. There are no discrete filling defects within the pulmonary arterial tree to suggest pulmonary embolism. Borderline enlarged caliber of the main pulmonary artery measuring 33 mm in diameter. Post median sternotomy. Cardiomegaly. No pericardial effusion though a small amount of fluid is seen within the pericardial recess. No evidence of thoracic aortic aneurysm or dissection on this nongated examination. Bovine configuration of the aortic arch. The branch vessel of the aortic arch appear widely patent throughout their imaged course. Review of the MIP images confirms the above findings. ---------------------------------------------------------------------------------- Nonvascular Findings: Mediastinum/Lymph Nodes: No mediastinal, hilar axillary lymphadenopathy. Lungs/Pleura: Evaluation of the pulmonary parenchyma is degraded secondary to patient respiratory artifact. Mild apical predominant interstitial thickening, most  conspicuous within the right upper lung appears unchanged since the 03/2012 examination. Minimal dependent subpleural ground-glass atelectasis. No focal airspace opacities. No pleural effusion or pneumothorax. There is a minimal amount of nonocclusive debris lying dependently within the mid aspect the tracheal air column (image 21, series 9). The remaining central pulmonary airways appear widely No discrete pulmonary nodules given limitation of the examination. Upper abdomen: Limited early arterial phase evaluation of the upper abdomen is normal. Musculoskeletal: No acute or aggressive osseous abnormalities. Regional soft tissues appear normal. Normal appearance of the thyroid gland. IMPRESSION: 1. No explanation for patient's vomiting and chest pain. Specifically, no evidence of pulmonary embolism. 2. Cardiomegaly with enlargement of the caliber the bowing pulmonary artery, nonspecific though could be  seen in the setting of pulmonary arterial hypertension. Further evaluation with cardiac echo could be performed as clinically indicated. 3. Grossly unchanged nonspecific mild interstitial thickening, most conspicuous within the bilateral lung apices, right greater than left, similar to the 02/07/2010 examination, non-specific though potentially the sequela of chronic bronchitic change. Electronically Signed   By: Sandi Mariscal M.D.   On: 12/18/2015 20:46   I have personally reviewed and evaluated these images and lab results as part of my medical decision-making.   EKG Interpretation   Date/Time:  Tuesday Dec 18 2015 15:06:03 EDT Ventricular Rate:  89 PR Interval:  186 QRS Duration: 74 QT Interval:  346 QTC Calculation: 420 R Axis:   63 Text Interpretation:  Normal sinus rhythm T wave abnormality, consider  inferolateral ischemia Abnormal ECG No ST elevation. T changes are new v.  11/2012 Confirmed by Jeneen Rinks  MD, Le[place] ([id number]) on 12/18/2015 3:14:50 PM Also  confirmed by Jeneen Rinks  MD, [place] ([id number]), editor [place]out  CT, Leda Gauze [phone])  on  12/18/2015 3:16:39 PM      MDM   Final diagnoses:  Right calf pain  Atypical chest pain    Patient is a 55 year old female with a past medical history of anemia, GERD, left breast cyst, hypertension, hormone replacement therapy presents to the ED with chest pain since last night. EKG revealed T changes but otherwise no ST elevation. Troponin was negative. Chest x-ray was unremarkable. Patient on long-term estradiol with recent illness that made her homebound with minimal ambulation for one week, history of DVT, elevated blood pressure and pain in right calf concerning for DVT or PE. We will order ultrasound of right lower extremity and CT angio of chest. Will give the patient fluids and pain meds.  Pt"s blood pressure elevated upon arrival. Has a history of high blood pressure states she did take her medication today. Patient states her last blood pressure is close to her baseline.  Chest pain reproducible on exam this is likely due to her cough she had from her cold last week and not cardiac in nature. CT scan negative for PE. CT did show enlargement of the pulmonary artery. Will instruct patient to follow-up with PCP and cardiology for further evaluation and treatment. Discussed strict return precautions.  I discussed all of the results with the patient and she expressed understanding to the verbal discharge instructions.      Kalman Drape, [place] 12/18/15 2249  Carmin Muskrat, MD 12/18/15 2312

## 2015-12-18 NOTE — Discharge Instructions (Signed)
Follow-up with your primary care provider tomorrow regarding your visit to the emergency department today. Be sure to discuss your CT scan results with them.  Return to emergency department if you experience shortness of breath, increased chest pain, you pass out, fevers, chills, visual changes.  Chest Wall Pain Chest wall pain is pain in or around the bones and muscles of your chest. Sometimes, an injury causes this pain. Sometimes, the cause may not be known. This pain may take several weeks or longer to get better. HOME CARE Pay attention to any changes in your symptoms. Take these actions to help with your pain:  Rest as told by your doctor.  Avoid activities that cause pain. Try not to use your chest, belly (abdominal), or side muscles to lift heavy things.  If directed, apply ice to the painful area:  Put ice in a plastic bag.  Place a towel between your skin and the bag.  Leave the ice on for 20 minutes, 2-3 times per day.  Take over-the-counter and prescription medicines only as told by your doctor.  Do not use tobacco products, including cigarettes, chewing tobacco, and e-cigarettes. If you need help quitting, ask your doctor.  Keep all follow-up visits as told by your doctor. This is important. GET HELP IF:  You have a fever.  Your chest pain gets worse.  You have new symptoms. GET HELP RIGHT AWAY IF:  You feel sick to your stomach (nauseous) or you throw up (vomit).  You feel sweaty or light-headed.  You have a cough with phlegm (sputum) or you cough up blood.  You are short of breath.   This information is not intended to replace advice given to you by your health care provider. Make sure you discuss any questions you have with your health care provider.   Document Released: 01/21/2008 Document Revised: 04/25/2015 Document Reviewed: 10/30/2014 Elsevier Interactive Patient Education [employer].

## 2015-12-18 NOTE — ED Notes (Signed)
Discharge instructions and prescription reviewed - voiced understanding.  

## 2015-12-18 NOTE — Progress Notes (Signed)
*  PRELIMINARY RESULTS* Vascular Ultrasound Right lower extremity venous duplex has been completed.  Preliminary findings: No evidence of DVT or baker's cyst.   Landry Mellow, RDMS, RVT  12/18/2015, 5:48 PM

## 2015-12-20 ENCOUNTER — Other Ambulatory Visit: Payer: Self-pay

## 2015-12-20 DIAGNOSIS — Z1231 Encounter for screening mammogram for malignant neoplasm of breast: Secondary | ICD-10-CM

## 2016-01-01 ENCOUNTER — Ambulatory Visit
Admission: RE | Admit: 2016-01-01 | Discharge: 2016-01-01 | Disposition: A | Discharge status: Home / Self Care | Payer: Medicare Other | Source: Ambulatory Visit

## 2016-01-01 DIAGNOSIS — Z1231 Encounter for screening mammogram for malignant neoplasm of breast: Secondary | ICD-10-CM

## 2016-01-18 ENCOUNTER — Encounter: Payer: Self-pay | Admitting: Gastroenterology

## 2016-03-17 ENCOUNTER — Other Ambulatory Visit: Payer: Self-pay | Admitting: Gastroenterology

## 2016-06-24 ENCOUNTER — Encounter (HOSPITAL_COMMUNITY): Payer: Self-pay | Admitting: Emergency Medicine

## 2016-06-24 DIAGNOSIS — K219 Gastro-esophageal reflux disease without esophagitis: Secondary | ICD-10-CM | POA: Insufficient documentation

## 2016-06-24 DIAGNOSIS — F319 Bipolar disorder, unspecified: Secondary | ICD-10-CM | POA: Insufficient documentation

## 2016-06-24 DIAGNOSIS — Z90710 Acquired absence of both cervix and uterus: Secondary | ICD-10-CM | POA: Diagnosis not present

## 2016-06-24 DIAGNOSIS — Z833 Family history of diabetes mellitus: Secondary | ICD-10-CM | POA: Insufficient documentation

## 2016-06-24 DIAGNOSIS — Z791 Long term (current) use of non-steroidal anti-inflammatories (NSAID): Secondary | ICD-10-CM | POA: Insufficient documentation

## 2016-06-24 DIAGNOSIS — E538 Deficiency of other specified B group vitamins: Secondary | ICD-10-CM | POA: Diagnosis not present

## 2016-06-24 DIAGNOSIS — G43909 Migraine, unspecified, not intractable, without status migrainosus: Secondary | ICD-10-CM | POA: Diagnosis not present

## 2016-06-24 DIAGNOSIS — Z9889 Other specified postprocedural states: Secondary | ICD-10-CM | POA: Diagnosis not present

## 2016-06-24 DIAGNOSIS — E876 Hypokalemia: Secondary | ICD-10-CM | POA: Insufficient documentation

## 2016-06-24 DIAGNOSIS — Z79899 Other long term (current) drug therapy: Secondary | ICD-10-CM | POA: Insufficient documentation

## 2016-06-24 DIAGNOSIS — Z8249 Family history of ischemic heart disease and other diseases of the circulatory system: Secondary | ICD-10-CM | POA: Insufficient documentation

## 2016-06-24 DIAGNOSIS — I671 Cerebral aneurysm, nonruptured: Secondary | ICD-10-CM | POA: Diagnosis not present

## 2016-06-24 DIAGNOSIS — Z91048 Other nonmedicinal substance allergy status: Secondary | ICD-10-CM | POA: Insufficient documentation

## 2016-06-24 DIAGNOSIS — R202 Paresthesia of skin: Secondary | ICD-10-CM | POA: Diagnosis present

## 2016-06-24 DIAGNOSIS — Z79890 Hormone replacement therapy (postmenopausal): Secondary | ICD-10-CM | POA: Insufficient documentation

## 2016-06-24 DIAGNOSIS — I10 Essential (primary) hypertension: Secondary | ICD-10-CM | POA: Diagnosis not present

## 2016-06-24 DIAGNOSIS — E0500 Thyrotoxicosis with diffuse goiter without thyrotoxic crisis or storm: Secondary | ICD-10-CM | POA: Insufficient documentation

## 2016-06-24 DIAGNOSIS — D649 Anemia, unspecified: Secondary | ICD-10-CM | POA: Diagnosis not present

## 2016-06-24 DIAGNOSIS — I440 Atrioventricular block, first degree: Secondary | ICD-10-CM | POA: Diagnosis not present

## 2016-06-24 DIAGNOSIS — E039 Hypothyroidism, unspecified: Secondary | ICD-10-CM | POA: Insufficient documentation

## 2016-06-24 DIAGNOSIS — Z91040 Latex allergy status: Secondary | ICD-10-CM | POA: Insufficient documentation

## 2016-06-24 DIAGNOSIS — Z7982 Long term (current) use of aspirin: Secondary | ICD-10-CM | POA: Insufficient documentation

## 2016-06-24 DIAGNOSIS — J45909 Unspecified asthma, uncomplicated: Secondary | ICD-10-CM | POA: Insufficient documentation

## 2016-06-24 DIAGNOSIS — F419 Anxiety disorder, unspecified: Secondary | ICD-10-CM | POA: Diagnosis not present

## 2016-06-24 DIAGNOSIS — J849 Interstitial pulmonary disease, unspecified: Secondary | ICD-10-CM | POA: Diagnosis not present

## 2016-06-24 DIAGNOSIS — I4581 Long QT syndrome: Secondary | ICD-10-CM | POA: Insufficient documentation

## 2016-06-24 LAB — CBC WITH DIFFERENTIAL/PLATELET
Basophils Absolute: 0 10*3/uL (ref 0.0–0.1)
Basophils Relative: 1 %
EOS ABS: 0.2 10*3/uL (ref 0.0–0.7)
EOS PCT: 3 %
HCT: 37.8 % (ref 36.0–46.0)
Hemoglobin: 11.9 g/dL — ABNORMAL LOW (ref 12.0–15.0)
LYMPHS ABS: 2.7 10*3/uL (ref 0.7–4.0)
Lymphocytes Relative: 42 %
MCH: 26.7 pg (ref 26.0–34.0)
MCHC: 31.5 g/dL (ref 30.0–36.0)
MCV: 84.8 fL (ref 78.0–100.0)
MONO ABS: 0.5 10*3/uL (ref 0.1–1.0)
Monocytes Relative: 8 %
Neutro Abs: 3 10*3/uL (ref 1.7–7.7)
Neutrophils Relative %: 47 %
PLATELETS: 134 10*3/uL — AB (ref 150–400)
RBC: 4.46 MIL/uL (ref 3.87–5.11)
RDW: 13.8 % (ref 11.5–15.5)
WBC: 6.4 10*3/uL (ref 4.0–10.5)

## 2016-06-24 LAB — URINALYSIS, ROUTINE W REFLEX MICROSCOPIC
BILIRUBIN URINE: NEGATIVE
GLUCOSE, UA: NEGATIVE mg/dL
HGB URINE DIPSTICK: NEGATIVE
KETONES UR: NEGATIVE mg/dL
Leukocytes, UA: NEGATIVE
Nitrite: NEGATIVE
PROTEIN: NEGATIVE mg/dL
Specific Gravity, Urine: 1.011 (ref 1.005–1.030)
pH: 6 (ref 5.0–8.0)

## 2016-06-24 NOTE — ED Triage Notes (Addendum)
Pt. reports elevated blood pressure ( 200+/190) onset Saturday with right sided headache radiating to right side of face and right shoulder , denies emesis or fever .

## 2016-06-25 ENCOUNTER — Emergency Department (HOSPITAL_COMMUNITY): Payer: Medicare Other

## 2016-06-25 ENCOUNTER — Observation Stay (HOSPITAL_BASED_OUTPATIENT_CLINIC_OR_DEPARTMENT_OTHER): Payer: Medicare Other

## 2016-06-25 ENCOUNTER — Encounter (HOSPITAL_COMMUNITY): Payer: Self-pay | Admitting: *Deleted

## 2016-06-25 ENCOUNTER — Observation Stay (HOSPITAL_COMMUNITY)
Admission: EM | Admit: 2016-06-25 | Discharge: 2016-06-25 | Disposition: A | Discharge status: Home / Self Care | Payer: Medicare Other | Attending: Internal Medicine | Admitting: Internal Medicine

## 2016-06-25 ENCOUNTER — Observation Stay (HOSPITAL_COMMUNITY): Payer: Medicare Other

## 2016-06-25 DIAGNOSIS — R9431 Abnormal electrocardiogram [ECG] [EKG]: Secondary | ICD-10-CM | POA: Diagnosis present

## 2016-06-25 DIAGNOSIS — E876 Hypokalemia: Secondary | ICD-10-CM | POA: Diagnosis present

## 2016-06-25 DIAGNOSIS — G43909 Migraine, unspecified, not intractable, without status migrainosus: Secondary | ICD-10-CM | POA: Diagnosis not present

## 2016-06-25 DIAGNOSIS — I10 Essential (primary) hypertension: Secondary | ICD-10-CM | POA: Diagnosis present

## 2016-06-25 DIAGNOSIS — F319 Bipolar disorder, unspecified: Secondary | ICD-10-CM | POA: Diagnosis not present

## 2016-06-25 DIAGNOSIS — R200 Anesthesia of skin: Secondary | ICD-10-CM

## 2016-06-25 DIAGNOSIS — R202 Paresthesia of skin: Secondary | ICD-10-CM

## 2016-06-25 DIAGNOSIS — I6789 Other cerebrovascular disease: Secondary | ICD-10-CM | POA: Diagnosis not present

## 2016-06-25 DIAGNOSIS — R519 Headache, unspecified: Secondary | ICD-10-CM | POA: Diagnosis present

## 2016-06-25 DIAGNOSIS — R51 Headache: Secondary | ICD-10-CM

## 2016-06-25 LAB — COMPREHENSIVE METABOLIC PANEL
ALK PHOS: 66 U/L (ref 38–126)
ALT: 13 U/L — AB (ref 14–54)
ALT: 14 U/L (ref 14–54)
AST: 21 U/L (ref 15–41)
AST: 23 U/L (ref 15–41)
Albumin: 3.6 g/dL (ref 3.5–5.0)
Albumin: 3.6 g/dL (ref 3.5–5.0)
Alkaline Phosphatase: 66 U/L (ref 38–126)
Anion gap: 8 (ref 5–15)
Anion gap: 8 (ref 5–15)
BILIRUBIN TOTAL: 0.4 mg/dL (ref 0.3–1.2)
BUN: 7 mg/dL (ref 6–20)
BUN: 7 mg/dL (ref 6–20)
CO2: 23 mmol/L (ref 22–32)
CO2: 26 mmol/L (ref 22–32)
CREATININE: 0.81 mg/dL (ref 0.44–1.00)
Calcium: 9.2 mg/dL (ref 8.9–10.3)
Calcium: 9.4 mg/dL (ref 8.9–10.3)
Chloride: 104 mmol/L (ref 101–111)
Chloride: 106 mmol/L (ref 101–111)
Creatinine, Ser: 0.81 mg/dL (ref 0.44–1.00)
GFR calc Af Amer: >60 mL/min (ref >60)
GLUCOSE: 94 mg/dL (ref 65–99)
Glucose, Bld: 86 mg/dL (ref 65–99)
Potassium: 3.1 mmol/L — ABNORMAL LOW (ref 3.5–5.1)
Potassium: 3.2 mmol/L — ABNORMAL LOW (ref 3.5–5.1)
Sodium: 137 mmol/L (ref 135–145)
Sodium: 138 mmol/L (ref 135–145)
TOTAL PROTEIN: 6.8 g/dL (ref 6.5–8.1)
TOTAL PROTEIN: 7.4 g/dL (ref 6.5–8.1)
Total Bilirubin: 0.5 mg/dL (ref 0.3–1.2)

## 2016-06-25 LAB — DIFFERENTIAL
BASOS ABS: 0.1 10*3/uL (ref 0.0–0.1)
Basophils Relative: 1 %
Eosinophils Absolute: 0.2 10*3/uL (ref 0.0–0.7)
Eosinophils Relative: 2 %
LYMPHS ABS: 3 10*3/uL (ref 0.7–4.0)
LYMPHS PCT: 45 %
MONOS PCT: 7 %
Monocytes Absolute: 0.5 10*3/uL (ref 0.1–1.0)
NEUTROS ABS: 3 10*3/uL (ref 1.7–7.7)
Neutrophils Relative %: 45 %

## 2016-06-25 LAB — BASIC METABOLIC PANEL
ANION GAP: 7 (ref 5–15)
BUN: 5 mg/dL — ABNORMAL LOW (ref 6–20)
CALCIUM: 8.9 mg/dL (ref 8.9–10.3)
CHLORIDE: 106 mmol/L (ref 101–111)
CO2: 26 mmol/L (ref 22–32)
Creatinine, Ser: 0.78 mg/dL (ref 0.44–1.00)
GFR calc non Af Amer: >60 mL/min (ref >60)
GLUCOSE: 139 mg/dL — AB (ref 65–99)
POTASSIUM: 3.1 mmol/L — AB (ref 3.5–5.1)
Sodium: 139 mmol/L (ref 135–145)

## 2016-06-25 LAB — CBC
HEMATOCRIT: 37 % (ref 36.0–46.0)
HEMOGLOBIN: 11.5 g/dL — AB (ref 12.0–15.0)
MCH: 26.6 pg (ref 26.0–34.0)
MCHC: 31.1 g/dL (ref 30.0–36.0)
MCV: 85.5 fL (ref 78.0–100.0)
Platelets: 172 10*3/uL (ref 150–400)
RBC: 4.33 MIL/uL (ref 3.87–5.11)
RDW: 13.4 % (ref 11.5–15.5)
WBC: 6.8 10*3/uL (ref 4.0–10.5)

## 2016-06-25 LAB — URINALYSIS, ROUTINE W REFLEX MICROSCOPIC
BILIRUBIN URINE: NEGATIVE
GLUCOSE, UA: NEGATIVE mg/dL
Hgb urine dipstick: NEGATIVE
Ketones, ur: NEGATIVE mg/dL
Leukocytes, UA: NEGATIVE
NITRITE: NEGATIVE
PH: 7 (ref 5.0–8.0)
Protein, ur: NEGATIVE mg/dL
SPECIFIC GRAVITY, URINE: 1.009 (ref 1.005–1.030)

## 2016-06-25 LAB — VITAMIN B12: Vitamin B-12: 252 pg/mL (ref 180–914)

## 2016-06-25 LAB — LIPID PANEL
CHOLESTEROL: 148 mg/dL (ref 0–200)
HDL: 55 mg/dL (ref >40)
LDL Cholesterol: 78 mg/dL (ref 0–99)
TRIGLYCERIDES: 75 mg/dL (ref <150)
Total CHOL/HDL Ratio: 2.7 RATIO
VLDL: 15 mg/dL (ref 0–40)

## 2016-06-25 LAB — RAPID URINE DRUG SCREEN, HOSP PERFORMED
AMPHETAMINES: NOT DETECTED
BENZODIAZEPINES: NOT DETECTED
Barbiturates: NOT DETECTED
Cocaine: NOT DETECTED
Opiates: NOT DETECTED
Tetrahydrocannabinol: NOT DETECTED

## 2016-06-25 LAB — ECHOCARDIOGRAM COMPLETE
Height: 66 in
WEIGHTICAEL: 2820.8 [oz_av]

## 2016-06-25 LAB — I-STAT CHEM 8, ED
BUN: 8 mg/dL (ref 6–20)
CHLORIDE: 103 mmol/L (ref 101–111)
CREATININE: 0.8 mg/dL (ref 0.44–1.00)
Calcium, Ion: 1.08 mmol/L — ABNORMAL LOW (ref 1.15–1.40)
GLUCOSE: 84 mg/dL (ref 65–99)
HCT: 40 % (ref 36.0–46.0)
HEMOGLOBIN: 13.6 g/dL (ref 12.0–15.0)
POTASSIUM: 5.7 mmol/L — AB (ref 3.5–5.1)
Sodium: 138 mmol/L (ref 135–145)
TCO2: 28 mmol/L (ref 0–100)

## 2016-06-25 LAB — ETHANOL: Alcohol, Ethyl (B): <5 mg/dL (ref <5)

## 2016-06-25 LAB — I-STAT TROPONIN, ED: TROPONIN I, POC: 0.01 ng/mL (ref 0.00–0.08)

## 2016-06-25 LAB — TSH: TSH: 0.679 u[IU]/mL (ref 0.350–4.500)

## 2016-06-25 LAB — APTT: APTT: 35 s (ref 24–36)

## 2016-06-25 LAB — PROTIME-INR
INR: 1.07
Prothrombin Time: 13.9 seconds (ref 11.4–15.2)

## 2016-06-25 LAB — LITHIUM LEVEL: Lithium Lvl: <0.06 mmol/L — ABNORMAL LOW (ref 0.60–1.20)

## 2016-06-25 MED ORDER — TRAMADOL HCL 50 MG PO TABS: 50.0000 mg | ORAL_TABLET | Freq: Four times a day (QID) | ORAL | Status: DC | PRN

## 2016-06-25 MED ORDER — ALBUTEROL SULFATE (2.5 MG/3ML) 0.083% IN NEBU: 3.0000 mL | INHALATION_SOLUTION | Freq: Four times a day (QID) | RESPIRATORY_TRACT | Status: DC | PRN

## 2016-06-25 MED ORDER — ENSURE ENLIVE PO LIQD
237.0000 mL | Freq: Two times a day (BID) | ORAL | Status: DC | PRN
  Filled 2016-06-25: qty 237

## 2016-06-25 MED ORDER — ENOXAPARIN SODIUM 40 MG/0.4ML ~~LOC~~ SOLN
40.0000 mg | SUBCUTANEOUS | Status: DC
  Administered 2016-06-25: 40 mg via SUBCUTANEOUS
  Filled 2016-06-25: qty 0.4

## 2016-06-25 MED ORDER — ACETAMINOPHEN 325 MG PO TABS: 650.0000 mg | ORAL_TABLET | Freq: Four times a day (QID) | ORAL | Status: DC | PRN

## 2016-06-25 MED ORDER — PROMETHAZINE HCL 25 MG/ML IJ SOLN
25.0000 mg | Freq: Once | INTRAMUSCULAR | Status: AC
  Administered 2016-06-25: 25 mg via INTRAVENOUS
  Filled 2016-06-25: qty 1

## 2016-06-25 MED ORDER — STROKE: EARLY STAGES OF RECOVERY BOOK
Freq: Once | Status: AC
  Administered 2016-06-25: 06:00:00
  Filled 2016-06-25: qty 1

## 2016-06-25 MED ORDER — POTASSIUM CHLORIDE CRYS ER 20 MEQ PO TBCR: 40.0000 meq | EXTENDED_RELEASE_TABLET | ORAL | Status: DC

## 2016-06-25 MED ORDER — SODIUM CHLORIDE 0.9 % IV SOLN
INTRAVENOUS | Status: DC
  Administered 2016-06-25: 06:00:00 via INTRAVENOUS

## 2016-06-25 MED ORDER — QUETIAPINE FUMARATE 50 MG PO TABS
300.0000 mg | ORAL_TABLET | Freq: Every day | ORAL | Status: DC
  Administered 2016-06-25: 150 mg via ORAL
  Filled 2016-06-25: qty 6

## 2016-06-25 MED ORDER — LITHIUM CARBONATE 300 MG PO TABS: 300.0000 mg | ORAL_TABLET | ORAL | Status: DC

## 2016-06-25 MED ORDER — SODIUM CHLORIDE 0.9% FLUSH: 3.0000 mL | INTRAVENOUS | Status: DC | PRN

## 2016-06-25 MED ORDER — LITHIUM CARBONATE 300 MG PO CAPS
300.0000 mg | ORAL_CAPSULE | Freq: Every day | ORAL | Status: DC
  Administered 2016-06-25: 300 mg via ORAL
  Filled 2016-06-25: qty 1

## 2016-06-25 MED ORDER — ASPIRIN EC 81 MG PO TBEC
81.0000 mg | DELAYED_RELEASE_TABLET | Freq: Every day | ORAL | Status: DC
Start: 2016-06-25 — End: 2016-06-25
  Administered 2016-06-25: 81 mg via ORAL
  Filled 2016-06-25: qty 1

## 2016-06-25 MED ORDER — AMLODIPINE BESYLATE 5 MG PO TABS
5.0000 mg | ORAL_TABLET | Freq: Every day | ORAL | Status: DC
  Filled 2016-06-25: qty 1

## 2016-06-25 MED ORDER — QUETIAPINE FUMARATE 50 MG PO TABS: 300.0000 mg | ORAL_TABLET | Freq: Every day | ORAL | Status: DC

## 2016-06-25 MED ORDER — LITHIUM CARBONATE 300 MG PO CAPS
600.0000 mg | ORAL_CAPSULE | Freq: Every day | ORAL | Status: DC
  Filled 2016-06-25: qty 2

## 2016-06-25 MED ORDER — SODIUM CHLORIDE 0.9% FLUSH
3.0000 mL | Freq: Two times a day (BID) | INTRAVENOUS | Status: DC
Start: 2016-06-25 — End: 2016-06-25

## 2016-06-25 NOTE — Progress Notes (Signed)
Discharge instructions reviewed with patient.  These included the following:  Follow-up appointments, medication regimen, appointments for blood draws, when to call the physician, and emotional support.  Comprehension ensured via use of "teach back" method.  Patient discharged to home via private vehicle.  Escorted to exit accompanied by nurse tech via wheelchair.

## 2016-06-25 NOTE — ED Notes (Signed)
Neurologist at bedside at this time.

## 2016-06-25 NOTE — Discharge Summary (Signed)
Physician Discharge Summary  Lynn Gonzalez [id number] DOB: [birth year] DOA: 06/25/2016  PCP: Pcp Not In System  Admit date: 06/25/2016 Discharge date: 06/25/2016   Recommendations for Outpatient Follow-Up:   1. Resume lithium 2. Periodic monitoring of Qtc   Discharge Diagnosis:   Principal Problem:   Paresthesias Active Problems:   Bipolar 1 disorder (HCC)   Hypertension   Headache   Hypokalemia   Prolonged Q-T interval on ECG   Discharge disposition:  Home.   Discharge Condition: Improved.  Diet recommendation: Low sodium, heart healthy  Wound care: None.   History of Present Illness:   Lynn Gonzalez is a 55 y.o. female with medical history significant of HTN, anemia, and bipolar disorder; who presents with complaints of right-sided headache and jaw stiffness. Symptoms initially started over the weekend while the patient was on a trip with friends in [place]. Patient notes that trip was stressful because her friends were asking her to do things that she did not want to do. Patient notes that she had gotten into a heated argument and due to this he squats. Patient notes having onset of right-sided headache that was throbbing in nature. She was brought back to [place] 3 days ago and noted still having a headache. Symptoms further progressed to involve the right side of her jaw and radiated down her neck and into her shoulder. Describes it as a tingling sensation on her right side of her jaw. Patient felt as though her jaw was locked and it caused her significant discomfort. Patient felt as if she were having a stroke and came in and his symptoms persisted.   Hospital Course by Problem:   Complicated migraine/headache  -resolved symptoms -patient asking to go home -MRI neg Echo/carotids done  HTN -resume home meds  Bipolar disorder On lithium - however, lithium level was low On seroquel 300mg   Monitor Qtc   Medical Consultants:    Neuro  Discharge Exam:   Vitals:   06/25/16 1420 06/25/16 1846  BP: 116/68 (!) 153/88  Pulse: 80 79  Resp: 18 18  Temp: 98.4 F (36.9 C) 98 F (36.7 C)   Vitals:   06/25/16 0941 06/25/16 0945 06/25/16 1420 06/25/16 1846  BP: 111/64  116/68 (!) 153/88  Pulse: 76 76 80 79  Resp: 18  18 18   Temp: 97.8 F (36.6 C)  98.4 F (36.9 C) 98 F (36.7 C)  TempSrc: Oral  Oral Oral  SpO2: 96%  98% 98%  Weight:      Height:        Gen:  NAD-symptoms resolved, asking to go home  The results of significant diagnostics from this hospitalization (including imaging, microbiology, ancillary and laboratory) are listed below for reference.     Procedures and Diagnostic Studies:   Mr Brain Wo Contrast  Result Date: 06/25/2016 CLINICAL DATA:  Right-sided headache and jaw stiffness. EXAM: MRI HEAD WITHOUT CONTRAST MRA HEAD WITHOUT CONTRAST TECHNIQUE: Multiplanar, multiecho pulse sequences of the brain and surrounding structures were obtained without intravenous contrast. Angiographic images of the head were obtained using MRA technique without contrast. COMPARISON:  Head CT 06/25/2016 and MRI 04/19/2011 FINDINGS: MRI HEAD FINDINGS Brain: There is no evidence of acute infarct. A punctate focus of increased trace diffusion signal in the right parietal lobe likely reflects T2 shine through. There is no evidence of intracranial hemorrhage, mass, midline shift, or extra-axial fluid collection. Cerebral atrophy is mildly to moderately advanced for age. Small foci of T2 hyperintensity in the  cerebral white matter are nonspecific but compatible with chronic small vessel ischemic disease, all also mildly advanced for age. Vascular: Major intracranial vascular flow voids are preserved. Skull and upper cervical spine: Unremarkable bone marrow signal. Sinuses/Orbits: Unremarkable orbits. No significant inflammatory sinus disease. Other: None. MRA HEAD FINDINGS The visualized distal vertebral arteries are patent  with the left being dominant. 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 SCA origins are patent. Basilar artery is widely patent. There are small posterior communicating arteries bilaterally. PCAs are patent without evidence of significant stenosis. The internal carotid arteries are widely patent from skullbase to carotid termini. 2 mm inferiorly directed outpouching from the horizontal cavernous ICA on the right compatible with small aneurysm. There is a small infundibulum at the right posterior communicating origin. The ACAs and MCAs are patent without evidence of major branch occlusion or significant stenosis. IMPRESSION: 1. No acute intracranial abnormality. 2. Mild chronic small vessel ischemic disease and mild to moderate cerebral atrophy. 3. No major intracranial branch vessel occlusion or significant stenosis. 4. 2 mm right cavernous ICA aneurysm. Electronically Signed   By: Logan Bores M.D.   On: 06/25/2016 08:39   Mr [name] Head/brain [id number] Cm  Result Date: 06/25/2016 CLINICAL DATA:  Right-sided headache and jaw stiffness. EXAM: MRI HEAD WITHOUT CONTRAST MRA HEAD WITHOUT CONTRAST TECHNIQUE: Multiplanar, multiecho pulse sequences of the brain and surrounding structures were obtained without intravenous contrast. Angiographic images of the head were obtained using MRA technique without contrast. COMPARISON:  Head CT 06/25/2016 and MRI 04/19/2011 FINDINGS: MRI HEAD FINDINGS Brain: There is no evidence of acute infarct. A punctate focus of increased trace diffusion signal in the right parietal lobe likely reflects T2 shine through. There is no evidence of intracranial hemorrhage, mass, midline shift, or extra-axial fluid collection. Cerebral atrophy is mildly to moderately advanced for age. Small foci of T2 hyperintensity in the cerebral white matter are nonspecific but compatible with chronic small vessel ischemic disease, all also mildly  advanced for age. Vascular: Major intracranial vascular flow voids are preserved. Skull and upper cervical spine: Unremarkable bone marrow signal. Sinuses/Orbits: Unremarkable orbits. No significant inflammatory sinus disease. Other: None. MRA HEAD FINDINGS The visualized distal vertebral arteries are patent with the left being dominant. 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 SCA origins are patent. Basilar artery is widely patent. There are small posterior communicating arteries bilaterally. PCAs are patent without evidence of significant stenosis. The internal carotid arteries are widely patent from skullbase to carotid termini. 2 mm inferiorly directed outpouching from the horizontal cavernous ICA on the right compatible with small aneurysm. There is a small infundibulum at the right posterior communicating origin. The ACAs and MCAs are patent without evidence of major branch occlusion or significant stenosis. IMPRESSION: 1. No acute intracranial abnormality. 2. Mild chronic small vessel ischemic disease and mild to moderate cerebral atrophy. 3. No major intracranial branch vessel occlusion or significant stenosis. 4. 2 mm right cavernous ICA aneurysm. Electronically Signed   By: Logan Bores M.D.   On: 06/25/2016 08:39   Ct Head Code Stroke W/o Cm  Result Date: 06/25/2016 CLINICAL DATA:  Code stroke.  Right-sided face and arm weakness EXAM: CT HEAD WITHOUT CONTRAST TECHNIQUE: Contiguous axial images were obtained from the base of the skull through the vertex without intravenous contrast. COMPARISON:  Head CT 10/15/2013 FINDINGS: Brain: No mass lesion, intraparenchymal hemorrhage or extra-axial  collection. No evidence of acute cortical infarct. Mildly age advanced atrophy. Mild periventricular hypoattenuation. Vascular: No hyperdense vessel or unexpected calcification. Skull: Normal visualized skull base, calvarium and extracranial soft  tissues. Sinuses/Orbits: No sinus fluid levels or advanced mucosal thickening. No mastoid effusion. Normal orbits. ASPECTS [facility] Stroke Program Early CT Score) - Ganglionic level infarction (caudate, lentiform nuclei, internal capsule, insula, M1-M3 cortex): 7 - Supraganglionic infarction (M4-M6 cortex): 3 Total score (0-10 with 10 being normal): 10 IMPRESSION: 1. No acute intracranial abnormality. 2. ASPECTS is 10. 3. Mildly age advanced atrophy. These results were called by telephone at the time of interpretation on 06/25/2016 at 1:11 am to Dr. Ezequiel Essex , who verbally acknowledged these results. Electronically Signed   By: Ulyses Jarred M.D.   On: 06/25/2016 01:10     Labs:   Basic Metabolic Panel:  Recent Labs Lab 06/24/16 2330 06/25/16 0042 06/25/16 0056 06/25/16 1035  NA 138 137 138 139  K 3.2* 3.1* 5.7* 3.1*  CL 104 106 103 106  CO2 26 23  --  26  GLUCOSE 94 86 84 139*  BUN 7 7 8  5*  CREATININE 0.81 0.81 0.80 0.78  CALCIUM 9.4 9.2  --  8.9   GFR Estimated Creatinine Clearance: 86.8 mL/min (by C-G formula based on SCr of 0.78 mg/dL). Liver Function Tests:  Recent Labs Lab 06/24/16 2330 06/25/16 0042  AST 23 21  ALT 14 13*  ALKPHOS 66 66  BILITOT 0.5 0.4  PROT 7.4 6.8  ALBUMIN 3.6 3.6   No results for input(s): LIPASE, AMYLASE in the last 168 hours. No results for input(s): AMMONIA in the last 168 hours. Coagulation profile  Recent Labs Lab 06/25/16 0042  INR 1.07    CBC:  Recent Labs Lab 06/24/16 2330 06/25/16 0042 06/25/16 0056  WBC 6.4 6.8  --   NEUTROABS 3.0 3.0  --   HGB 11.9* 11.5* 13.6  HCT 37.8 37.0 40.0  MCV 84.8 85.5  --   PLT 134* 172  --    Cardiac Enzymes: No results for input(s): CKTOTAL, CKMB, CKMBINDEX, TROPONINI in the last 168 hours. BNP: Invalid input(s): POCBNP CBG: No results for input(s): GLUCAP in the last 168 hours. D-Dimer No results for input(s): DDIMER in the last 72 hours. Hgb A1c No results for input(s):  HGBA1C in the last 72 hours. Lipid Profile  Recent Labs  06/25/16 0501  CHOL 148  HDL 55  LDLCALC 78  TRIG 75  CHOLHDL 2.7   Thyroid function studies  Recent Labs  06/25/16 0501  TSH 0.679   Anemia work up  Recent Labs  06/25/16 0501  VITAMINB12 252   Microbiology No results found for this or any previous visit (from the past 240 hour(s)).   Discharge Instructions:   Discharge Instructions    Diet - low sodium heart healthy    Complete by:  As directed    Increase activity slowly    Complete by:  As directed        Medication List    STOP taking these medications   meloxicam 15 MG tablet Commonly known as:  MOBIC   ondansetron 4 MG tablet Commonly known as:  ZOFRAN     TAKE these medications   albuterol 108 (90 Base) MCG/ACT inhaler Commonly known as:  PROVENTIL HFA;VENTOLIN HFA Inhale 2 puffs into the lungs every 6 (six) hours as needed for wheezing or shortness of breath.   amLODipine 5 MG tablet Commonly known as:  NORVASC take  1 tablet by mouth once daily   estradiol 2 MG tablet Commonly known as:  ESTRACE take 1 tablet by mouth once daily   folic acid 1 MG tablet Commonly known as:  FOLVITE take 1 tablet by mouth once daily   lithium 300 MG tablet Take 300-600 mg by mouth See admin instructions. Take 1 tablet every morning and take 2 tablets every night at bedtime.   QUEtiapine 300 MG tablet Commonly known as:  SEROQUEL Take 300 mg by mouth at bedtime.      Follow-up Information    PCP 1 week Follow up.            Time coordinating discharge: 35 min  Signed:  Frankee Gritz U Jalise Zawistowski   [facility] 06/25/2016, 7:00 PM

## 2016-06-25 NOTE — Progress Notes (Signed)
Preliminary results by tech - Carotid Duplex Completed. No evidence of a significant stenosis noted in bilateral carotid arteries. Tammi Boulier, BS, RDMS, RVT  

## 2016-06-25 NOTE — Progress Notes (Signed)
*  PRELIMINARY RESULTS* Echocardiogram 2D Echocardiogram has been performed.  Leavy Cella 06/25/2016, 4:31 PM

## 2016-06-25 NOTE — Progress Notes (Signed)
Initial Nutrition Assessment  INTERVENTION:  Monitor PO intake for adequacy Ensure Enlive PRN   NUTRITION DIAGNOSIS:   Predicted suboptimal nutrient intake related to poor appetite as evidenced by per patient/family report.   GOAL:   Patient will meet greater than or equal to 90% of their needs   MONITOR:   PO intake, Labs, Skin, Weight trends  REASON FOR ASSESSMENT:   Malnutrition Screening Tool    ASSESSMENT:   55 y.o. female with medical history significant of HTN, anemia, and bipolar disorder; who presents with complaints of right-sided headache and jaw stiffness. Symptoms initially started over the weekend while the patient was on a trip with friends in [place].   Pt asleep at time of visit, unable to wake pt. She reported wt loss and poor appetite in MST report, but weight history shows pt's weight has been stable for over a year. Breakfast tray at bedside untouched.   Labs: low potassium, low hemoglobin  Diet Order:  Diet Heart Room service appropriate? Yes; Fluid consistency: Thin  Skin:  Reviewed, no issues  Last BM:  11/2  Height:   Ht Readings from Last 1 Encounters:  06/25/16 5\' 6"  (1.676 m)    Weight:   Wt Readings from Last 1 Encounters:  06/25/16 176 lb 4.8 oz (80 kg)    Ideal Body Weight:  59.1 kg  BMI:  Body mass index is 28.46 kg/m.  Estimated Nutritional Needs:   Kcal:  2000-2200  Protein:  70-80 grams  Fluid:  2-2.2 L/day  EDUCATION NEEDS:   No education needs identified at this time  [place], CSP, LDN Inpatient Clinical Dietitian Pager: [phone] After Hours Pager: [phone]

## 2016-06-25 NOTE — H&P (Signed)
History and Physical    Lynn Gonzalez [id number] DOB: 25-Apr-1961 DOA: 06/25/2016  Referring MD/NP/PA: Dr. Wyvonnia Dusky PCP: Pcp Not In System  Patient coming from: Home  Chief Complaint: right sided headache and jaw stiffness  HPI: Lynn Gonzalez is a 55 y.o. female with medical history significant of HTN, anemia, and bipolar disorder; who presents with complaints of right-sided headache and jaw stiffness. Symptoms initially started over the weekend while the patient was on a trip with friends in [place]. Patient notes that trip was stressful because her friends were asking her to do things that she did not want to do. Patient notes that she had gotten into a heated argument and due to this he squats. Patient notes having onset of right-sided headache that was throbbing in nature. She was brought back to [place] 3 days ago and noted still having a headache. Symptoms further progressed to involve the right side of her jaw and radiated down her neck and into her shoulder. Describes it as a tingling sensation on her right side of her jaw. Patient felt as though her jaw was locked and it caused her significant discomfort. Patient felt as if she were having a stroke and came in and his symptoms persisted.  ED Course: Upon admission to the emergency department as a code stroke was evaluated by neurology who gave patient a NIHSS score of 1. Systolic blood pressures were noted initially as 190. CT scan of the brain showed no acute abnormalities. Lab work revealed potassium 3.1. The recommended admission for observation for further workup.  Review of Systems: As per HPI otherwise 10 point review of systems negative.   Past Medical History:  Diagnosis Date  . ANEMIA   . Anxiety   . Asthma   . B12 DEFICIENCY   . Bipolar affective disorder ([place]) 1985   ?nervous breakdown  . BREAST CYST, LEFT   . Breast tumor   . GASTROESOPHAGEAL REFLUX DISEASE   . GOITER   . H/O cardiovascular stress test     Lex MV 4/14:  No ischemia, EF 72%, normal wall motion  . Hyperthyroidism   . INTERSTITIAL LUNG DISEASE   . Syncope   . Thyroid disease    ? overactive thyroid    Past Surgical History:  Procedure Laterality Date  . ABDOMINAL HYSTERECTOMY  06/03/10  . BUNIONECTOMY     bilateral  . GALLBLADDER SURGERY  1985   stones removed  . KNEE SURGERY  1987   bilateral  . LUMBAR LAMINECTOMY/DECOMPRESSION MICRODISCECTOMY Right 01/13/2013   Procedure: MICRODISCECTOMY FORAMINOTOMY OF L4-5 ON THE RIGHT;  Surgeon: Magnus Sinning, MD;  Location: [facility];  Service: Orthopedics;  Laterality: Right;  . MEDIASTINOSCOPY  2001  . STERNOTOMY  2001  . TONSILLECTOMY    . [place]     reports that she has never smoked. She has never used smokeless tobacco. She reports that she does not drink alcohol or use drugs.  Allergies  Allergen Reactions  . Adhesive [Tape] Rash  . Latex Rash    Family History  Problem Relation Age of Onset  . Cancer Father     ?type  . Hypertension Father   . Diabetes Mother   . Hypertension Mother     Prior to Admission medications   Medication Sig Start Date End Date Taking? Authorizing Provider  albuterol (PROVENTIL HFA;VENTOLIN HFA) 108 (90 BASE) MCG/ACT inhaler Inhale 2 puffs into the lungs every 6 (six) hours as needed for wheezing  or shortness of breath.    Yes Historical Provider, MD  amLODipine (NORVASC) 5 MG tablet take 1 tablet by mouth once daily 08/23/14  Yes Debbrah Alar, NP  estradiol (ESTRACE) 2 MG tablet take 1 tablet by mouth once daily 08/23/14  Yes Debbrah Alar, NP  lithium 300 MG tablet Take 300-600 mg by mouth See admin instructions. Take 1 tablet every morning and take 2 tablets every night at bedtime.   Yes Historical Provider, MD  QUEtiapine (SEROQUEL) 300 MG tablet Take 300 mg by mouth at bedtime.   Yes Historical Provider, MD  folic acid (FOLVITE) 1 MG tablet take 1 tablet by mouth once daily Patient not taking:  Reported on 06/25/2016 10/24/13   Debbrah Alar, NP  meloxicam (MOBIC) 15 MG tablet Take 1 tablet (15 mg total) by mouth daily. Patient not taking: Reported on 06/25/2016 07/17/15   Alvina Chou, PA-C  ondansetron (ZOFRAN) 4 MG tablet Take 1 tablet (4 mg total) by mouth every 6 (six) hours. Patient not taking: Reported on 06/25/2016 12/18/15   Kalman Drape, PA    Physical Exam:    Constitutional: NAD, calm, comfortable Vitals:   06/25/16 0245 06/25/16 0300 06/25/16 0315 06/25/16 0330  BP: 113/79 117/76  103/68  Pulse: 70 68  71  Resp: 17 16  15   Temp:   97.9 F (36.6 C)   TempSrc:      SpO2: 97% 98%  98%   Eyes: PERRL, lids and conjunctivae normal ENMT: Mucous membranes are moist. Posterior pharynx clear of any exudate or lesions.Normal dentition.  Neck: normal, supple, no masses, no thyromegaly Respiratory: clear to auscultation bilaterally, no wheezing, no crackles. Normal respiratory effort. No accessory muscle use.  Cardiovascular: Regular rate and rhythm, no murmurs / rubs / gallops. No extremity edema. 2+ pedal pulses. No carotid bruits.  Abdomen: no tenderness, no masses palpated. No hepatosplenomegaly. Bowel sounds positive.  Musculoskeletal: no clubbing / cyanosis. No joint deformity upper and lower extremities. Good ROM, no contractures. Normal muscle tone.  Skin: no rashes, lesions, ulcers. No induration Neurologic: CN 2-12 grossly intact. Sensation intact, DTR normal. Strength 5/5 in all 4.  Psychiatric: poor judgment and insight. Alert and oriented x 3.    Labs on Admission: I have personally reviewed following labs and imaging studies  CBC:  Recent Labs Lab 06/24/16 2330 06/25/16 0042 06/25/16 0056  WBC 6.4 6.8  --   NEUTROABS 3.0 3.0  --   HGB 11.9* 11.5* 13.6  HCT 37.8 37.0 40.0  MCV 84.8 85.5  --   PLT 134* 172  --    Basic Metabolic Panel:  Recent Labs Lab 06/24/16 2330 06/25/16 0042 06/25/16 0056  NA 138 137 138  K 3.2* 3.1* 5.7*    CL 104 106 103  CO2 26 23  --   GLUCOSE 94 86 84  BUN 7 7 8   CREATININE 0.81 0.81 0.80  CALCIUM 9.4 9.2  --    GFR: CrCl cannot be calculated (Unknown ideal weight.). Liver Function Tests:  Recent Labs Lab 06/24/16 2330 06/25/16 0042  AST 23 21  ALT 14 13*  ALKPHOS 66 66  BILITOT 0.5 0.4  PROT 7.4 6.8  ALBUMIN 3.6 3.6   No results for input(s): LIPASE, AMYLASE in the last 168 hours. No results for input(s): AMMONIA in the last 168 hours. Coagulation Profile:  Recent Labs Lab 06/25/16 0042  INR 1.07   Cardiac Enzymes: No results for input(s): CKTOTAL, CKMB, CKMBINDEX, TROPONINI in the last 168  hours. BNP (last 3 results) No results for input(s): PROBNP in the last 8760 hours. HbA1C: No results for input(s): HGBA1C in the last 72 hours. CBG: No results for input(s): GLUCAP in the last 168 hours. Lipid Profile: No results for input(s): CHOL, HDL, LDLCALC, TRIG, CHOLHDL, LDLDIRECT in the last 72 hours. Thyroid Function Tests: No results for input(s): TSH, T4TOTAL, FREET4, T3FREE, THYROIDAB in the last 72 hours. Anemia Panel: No results for input(s): VITAMINB12, FOLATE, FERRITIN, TIBC, IRON, RETICCTPCT in the last 72 hours. Urine analysis:    Component Value Date/Time   COLORURINE YELLOW 06/24/2016 2329   APPEARANCEUR CLEAR 06/24/2016 2329   LABSPEC 1.011 06/24/2016 2329   PHURINE 6.0 06/24/2016 2329   GLUCOSEU NEGATIVE 06/24/2016 2329   HGBUR NEGATIVE 06/24/2016 2329   BILIRUBINUR NEGATIVE 06/24/2016 2329   KETONESUR NEGATIVE 06/24/2016 2329   PROTEINUR NEGATIVE 06/24/2016 2329   UROBILINOGEN 0.2 08/23/2012 1716   NITRITE NEGATIVE 06/24/2016 2329   LEUKOCYTESUR NEGATIVE 06/24/2016 2329   Sepsis Labs: No results found for this or any previous visit (from the past 240 hour(s)).   Radiological Exams on Admission: Ct Head Code Stroke W/o Cm  Result Date: 06/25/2016 CLINICAL DATA:  Code stroke.  Right-sided face and arm weakness EXAM: CT HEAD WITHOUT  CONTRAST TECHNIQUE: Contiguous axial images were obtained from the base of the skull through the vertex without intravenous contrast. COMPARISON:  Head CT 10/15/2013 FINDINGS: Brain: No mass lesion, intraparenchymal hemorrhage or extra-axial collection. No evidence of acute cortical infarct. Mildly age advanced atrophy. Mild periventricular hypoattenuation. Vascular: No hyperdense vessel or unexpected calcification. Skull: Normal visualized skull base, calvarium and extracranial soft tissues. Sinuses/Orbits: No sinus fluid levels or advanced mucosal thickening. No mastoid effusion. Normal orbits. ASPECTS [facility] Stroke Program Early CT Score) - Ganglionic level infarction (caudate, lentiform nuclei, internal capsule, insula, M1-M3 cortex): 7 - Supraganglionic infarction (M4-M6 cortex): 3 Total score (0-10 with 10 being normal): 10 IMPRESSION: 1. No acute intracranial abnormality. 2. ASPECTS is 10. 3. Mildly age advanced atrophy. These results were called by telephone at the time of interpretation on 06/25/2016 at 1:11 am to Dr. Ezequiel Essex , who verbally acknowledged these results. Electronically Signed   By: Ulyses Jarred M.D.   On: 06/25/2016 01:10    EKG: Independently reviewed. Sinus rhythm with prolonged QTC of 608  Assessment/Plan Headache and possible right jaw paresthesia: Question possibility of strokelike symptoms given elevated blood pressure and patient's symptoms of right-sided jaw tightness/discomfort. - Admit to a telemetry bed - Appreciate neurology consultation, follow-up with her further recommendations - f/u UDS - ASA - Checking MRI/MRA without contrast, echocardiogram, carotid Dopplers, vitamin B12, TSH  - neuro checks  Hypokalemia: Potassium 3.1 on admission. Patient's low potassium could be one possible cause for the jaw sensations patient experienced. - Give 40 mEq of potassium chloride orally 1 dose now  - Continue to monitor and replace as needed  Prolonged QTC:  Initially noted to be 608 on admission previously QTC within normal limits.. - Recheck EKG in a.m - May need to make further medication adjustments if QTC remains prolonged  Essential hypertension - Continue amlodipine  Bipolar disorder - Question if right-sided jaw symptoms related to extrapyramidal side effects of antipsychotic medications - Continue Seroquel and lithium  DVT prophylaxis: lovenox Code Status: full  Family Communication: No family present  hepatocyte  Disposition Plan: Likely discharge home in medically stable Consults called: Neurology Admission status: Obs telemetry  Norval Morton MD [facility] Pager [phone]  If 7PM-7AM, please contact night-coverage [email or web address] Password [facility]  06/25/2016, 3:49 AM

## 2016-06-25 NOTE — Progress Notes (Signed)
Code Stroke called on 55 y.o female per EDP Dr. Wyvonnia Dusky. Pt arrived to [facility] at 2314 with complaints of right sided headache, right jaw pain, right shoulder pain, and right side facial numbness. Per Patient she had sudden onset of moderate pain and numbness with pins and needles sensation to her right face at 10 pm 06/25/16. She also states that her friend who drove her to hospital noted her speech was different, now resolved. LSN 10 pm. Pertinent history includes HTN, anemia, hypothyroidism, anxiety. CT scan completed STAT, negative per Radiologist report. Neurologist Dr. Cheral Marker to bedside. NIHSS 1 for sensory deficit to right face and left leg. Pt not a TPA candidate at this time due to mild symptoms. Pt treated with phenergan IVP per Dr. Cheral Marker.

## 2016-06-25 NOTE — ED Provider Notes (Signed)
[place] DEPT Provider Note   CSN: [id number] Arrival date & time: 06/24/16  2314  By signing my name below, I, Emmanuella Mensah, attest that this documentation has been prepared under the direction and in the presence of Ezequiel Essex, MD. Electronically Signed: Judithann Sauger, ED Scribe. 06/25/16. 3:16 AM.   History   Chief Complaint Chief Complaint  Patient presents with  . Hypertension  . Headache/Facial pain / Shoulder pain    HPI Comments: Lynn Gonzalez is a 55 y.o. female with a hx of anemia, hypertension, and hypothyroidism who presents to the Emergency Department complaining of sudden onset of moderate pain and numbness with pins and needles sensation to her right face that radiates down to her right shoulder onset 10 pm yesterday. She reports associated weakness in her right hand and change in her speech. She states that she has been having persistent elevated BP (195/140 PTA) onset 4 days ago. She adds that she began to monitor her BP because she was experiencing persistent generalized headaches. She denies any current headache. No alleviating factors noted. Pt has not tried any medications PTA. She states that she has been compliant with her norvasc and other medications. She denies any illicit drug use. She also denies any visual disturbances, headaches, chest pain, SOB, back pain, nausea, vomiting, or any other symptoms.   The history is provided by the patient. No language interpreter was used.    Past Medical History:  Diagnosis Date  . ANEMIA   . Anxiety   . Asthma   . B12 DEFICIENCY   . Bipolar affective disorder ([place]) 1985   ?nervous breakdown  . BREAST CYST, LEFT   . Breast tumor   . GASTROESOPHAGEAL REFLUX DISEASE   . GOITER   . H/O cardiovascular stress test    Lex MV 4/14:  No ischemia, EF 72%, normal wall motion  . Hyperthyroidism   . INTERSTITIAL LUNG DISEASE   . Syncope   . Thyroid disease    ? overactive thyroid    Patient Active  Problem List   Diagnosis Date Noted  . Thumb numbness 10/28/2013  . Hypertension 10/24/2013  . Hip pain 06/21/2013  . Low back pain radiating to right leg 05/04/2013  . Elevated blood pressure 10/12/2012  . Snoring 04/07/2012  . GERD (gastroesophageal reflux disease) 01/22/2012  . Gout 03/12/2011  . GOITER 10/21/2010  . BREAST CYST, LEFT 07/23/2010  . B12 DEFICIENCY 03/08/2010  . Bipolar 1 disorder ([place]) 03/08/2010  . HYPERTHYROIDISM 02/11/2010  . ANEMIA 02/11/2010  . INTERSTITIAL LUNG DISEASE 02/11/2010  . GASTROESOPHAGEAL REFLUX DISEASE 02/11/2010    Past Surgical History:  Procedure Laterality Date  . ABDOMINAL HYSTERECTOMY  06/03/10  . BUNIONECTOMY     bilateral  . GALLBLADDER SURGERY  1985   stones removed  . KNEE SURGERY  1987   bilateral  . LUMBAR LAMINECTOMY/DECOMPRESSION MICRODISCECTOMY Right 01/13/2013   Procedure: MICRODISCECTOMY FORAMINOTOMY OF L4-5 ON THE RIGHT;  Surgeon: Magnus Sinning, MD;  Location: [facility];  Service: Orthopedics;  Laterality: Right;  . MEDIASTINOSCOPY  2001  . STERNOTOMY  2001  . TONSILLECTOMY    . Door    OB History    No data available       Home Medications    Prior to Admission medications   Medication Sig Start Date End Date Taking? Authorizing Provider  albuterol (PROVENTIL HFA;VENTOLIN HFA) 108 (90 BASE) MCG/ACT inhaler Inhale 2 puffs into the lungs every 6 (six) hours as  needed for wheezing or shortness of breath.     Historical Provider, MD  amLODipine (NORVASC) 5 MG tablet take 1 tablet by mouth once daily 08/23/14   Debbrah Alar, NP  colchicine 0.6 MG tablet Take 0.6 mg by mouth daily as needed. Two tabs by mouth at start of gout attack.  Take 1 tablet one hour later. 06/21/13   Debbrah Alar, NP  estradiol (ESTRACE) 2 MG tablet take 1 tablet by mouth once daily 08/23/14   Debbrah Alar, NP  folic acid (FOLVITE) 1 MG tablet take 1 tablet by mouth once daily Patient not taking: Reported  on 12/18/2015 10/24/13   Debbrah Alar, NP  lithium 300 MG tablet Take 1 tablet every morning and take 2 tablets every night at bedtime.    Historical Provider, MD  meloxicam (MOBIC) 15 MG tablet Take 1 tablet (15 mg total) by mouth daily. 07/17/15   Kaitlyn Szekalski, PA-C  ondansetron (ZOFRAN) 4 MG tablet Take 1 tablet (4 mg total) by mouth every 6 (six) hours. 12/18/15   Kalman Drape, PA  QUEtiapine (SEROQUEL) 300 MG tablet Take 300 mg by mouth at bedtime.    Historical Provider, MD  QUEtiapine (SEROQUEL) 400 MG tablet Take 300 mg by mouth at bedtime.     Historical Provider, MD  ranitidine (ZANTAC) 150 MG tablet Take 150 mg by mouth 2 (two) times daily. 12/13/15   Historical Provider, MD    Family History Family History  Problem Relation Age of Onset  . Cancer Father     ?type  . Hypertension Father   . Diabetes Mother   . Hypertension Mother     Social History Social History  Substance Use Topics  . Smoking status: Never Smoker  . Smokeless tobacco: Never Used  . Alcohol use No     Allergies   Adhesive [tape] and Latex   Review of Systems Review of Systems  A complete 10 system review of systems was obtained and all systems are negative except as noted in the HPI and PMH.   Physical Exam Updated Vital Signs BP 139/86   Pulse 70   Temp 98 F (36.7 C) (Oral)   Resp 18   LMP 04/18/2010   SpO2 98%   Physical Exam  Constitutional: She is oriented to person, place, and time. She appears well-developed and well-nourished. No distress.  HENT:  Head: Normocephalic and atraumatic.  Mouth/Throat: Oropharynx is clear and moist. No oropharyngeal exudate.  Eyes: Conjunctivae and EOM are normal. Pupils are equal, round, and reactive to light.  Neck: Normal range of motion. Neck supple.  No meningismus.  Cardiovascular: Normal rate, regular rhythm, normal heart sounds and intact distal pulses.   No murmur heard. Pulmonary/Chest: Effort normal and breath sounds normal.  No respiratory distress.  Abdominal: Soft. There is no tenderness. There is no rebound and no guarding.  Musculoskeletal: Normal range of motion. She exhibits no edema or tenderness.  Neurological: She is alert and oriented to person, place, and time. No cranial nerve deficit. She exhibits normal muscle tone. Coordination normal.  CN 2-12 intact Decreased sensation to right face, neck, and arm Slight decrease of strength to right arm No pronator drift  Skin: Skin is warm.  Psychiatric: She has a normal mood and affect. Her behavior is normal.  Nursing note and vitals reviewed.    ED Treatments / Results  DIAGNOSTIC STUDIES: Oxygen Saturation is 98% on RA, normal by my interpretation.    COORDINATION OF CARE: 12:46 AM-  Pt advised of plan for treatment and pt agrees. Pt will receive lab work, CT head code stroke, and EKG for further evaluation.    2:48 AM - She reports that she no longer has a headache. She denies any chest pain or headache. Pt informed of her lab results, CT results, and EKG results. She will be admitted.   Labs (all labs ordered are listed, but only abnormal results are displayed) Labs Reviewed  CBC WITH DIFFERENTIAL/PLATELET - Abnormal; Notable for the following:       Result Value   Hemoglobin 11.9 (*)    Platelets 134 (*)    All other components within normal limits  COMPREHENSIVE METABOLIC PANEL - Abnormal; Notable for the following:    Potassium 3.2 (*)    All other components within normal limits  CBC - Abnormal; Notable for the following:    Hemoglobin 11.5 (*)    All other components within normal limits  COMPREHENSIVE METABOLIC PANEL - Abnormal; Notable for the following:    Potassium 3.1 (*)    ALT 13 (*)    All other components within normal limits  LITHIUM LEVEL - Abnormal; Notable for the following:    Lithium Lvl <0.06 (*)    All other components within normal limits  I-STAT CHEM 8, ED - Abnormal; Notable for the following:    Potassium 5.7  (*)    Calcium, Ion 1.08 (*)    All other components within normal limits  URINALYSIS, ROUTINE W REFLEX MICROSCOPIC (NOT AT [facility])  ETHANOL  PROTIME-INR  APTT  DIFFERENTIAL  RAPID URINE DRUG SCREEN, HOSP PERFORMED  URINALYSIS, ROUTINE W REFLEX MICROSCOPIC (NOT AT [facility])  LIPID PANEL  VITAMIN B12  TSH  HEMOGLOBIN [id number]  BASIC METABOLIC PANEL  I-STAT TROPOININ, ED    EKG  EKG Interpretation  Date/Time:  Wednesday June 25 2016 01:21:15 EST Ventricular Rate:  71 PR Interval:    QRS Duration: 88 QT Interval:  559 QTC Calculation: 608 R Axis:   75 Text Interpretation:  Sinus rhythm Prolonged PR interval Nonspecific T abnrm, anterolateral leads Prolonged QT interval No significant change was found prolonged QT Confirmed by Wyvonnia Dusky  MD, Annie Main [id number]) on 06/25/2016 3:15:59 AM Also confirmed by Wyvonnia Dusky  MD, Aron Needles [phone]), editor [place]  CCT, [place] ([id number])  on 06/25/2016 6:46:08 AM       Radiology Ct Head Code Stroke W/o Cm  Result Date: 06/25/2016 CLINICAL DATA:  Code stroke.  Right-sided face and arm weakness EXAM: CT HEAD WITHOUT CONTRAST TECHNIQUE: Contiguous axial images were obtained from the base of the skull through the vertex without intravenous contrast. COMPARISON:  Head CT 10/15/2013 FINDINGS: Brain: No mass lesion, intraparenchymal hemorrhage or extra-axial collection. No evidence of acute cortical infarct. Mildly age advanced atrophy. Mild periventricular hypoattenuation. Vascular: No hyperdense vessel or unexpected calcification. Skull: Normal visualized skull base, calvarium and extracranial soft tissues. Sinuses/Orbits: No sinus fluid levels or advanced mucosal thickening. No mastoid effusion. Normal orbits. ASPECTS [facility] Stroke Program Early CT Score) - Ganglionic level infarction (caudate, lentiform nuclei, internal capsule, insula, M1-M3 cortex): 7 - Supraganglionic infarction (M4-M6 cortex): 3 Total score (0-10 with 10 being normal): 10 IMPRESSION: 1. No acute  intracranial abnormality. 2. ASPECTS is 10. 3. Mildly age advanced atrophy. These results were called by telephone at the time of interpretation on 06/25/2016 at 1:11 am to Dr. Ezequiel Essex , who verbally acknowledged these results. Electronically Signed   By: Ulyses Jarred M.D.   On: 06/25/2016 01:10  Procedures Procedures (including critical care time)  Medications Ordered in ED Medications - No data to display   Initial Impression / Assessment and Plan / ED Course  Ezequiel Essex, MD has reviewed the triage vital signs and the nursing notes.  Pertinent labs & imaging results that were available during my care of the patient were reviewed by me and considered in my medical decision making (see chart for details).  Clinical Course    Patient presents with elevated blood pressure, headache. Developed right sided numbness to her face and arm around 10 PM. Code stroke activated on my assessment.  CT head is negative. Seen by Dr. Cheral Marker of neurology who does not recommend TPA given her minimal deficits.  Initial potassium was hemolyzed. Was 3.1 and rechecked. Patient seen by neurology who recommends admission for stroke workup. Discussed with Dr. Tamala Julian. Blood pressure has improved to 140/89.  Final Clinical Impressions(s) / ED Diagnoses   Final diagnoses:  Paresthesia  Paresthesias    New Prescriptions New Prescriptions   No medications on file   I personally performed the services described in this documentation, which was scribed in my presence. The recorded information has been reviewed and is accurate.    Ezequiel Essex, MD 06/25/16 [phone]

## 2016-06-25 NOTE — Progress Notes (Signed)
Patient admitted after midnight, please see H&P.  Await echo for completion of work up.  Patient has been out of lithium for several weeks as physician called in wrong kind.  Plan to d/c in AM once echo done  Eulogio Bear DO

## 2016-06-25 NOTE — ED Notes (Signed)
Patient c/o numbness in R side of face, but changes time of when symptoms began (Tuesday 2200, then states Saturday, then changes it to just before she arrived at ED). Also, changed her list of complaints/symtoms several times while RN assessing patient. No neuro deficits noted.

## 2016-06-25 NOTE — ED Notes (Signed)
Hospitalist at bedside at this time 

## 2016-06-25 NOTE — ED Notes (Signed)
Patient ambulated to restroom with standby assist of EMT. No distress noted. Gait slow, steady. No urine specimen collected at this time.

## 2016-06-25 NOTE — Consult Note (Signed)
Referring Physician: Dr. Wyvonnia Dusky    Chief Complaint: Right sided headache with radiating pain to right face and shoulder.  HPI: Lynn Gonzalez is an 55 y.o. female who was in her normal state of health until a trip to [place] over the weekend with her girlfriends. The social mood was tense per patient and she began experiencing a right sided headache, which persisted during the remainder of the trip. An argument ensued, which made her headache worse. When she returned to [facility], her headache was still present and she tried to "wait it out". Today she began experiencing right sided sensory numbness so she presented for further evaluation. Her headache has been associated with elevated BPs at home (200+/190). In the ED she experienced some chest pain following CT and an EKG with Troponin was ordered. No clear LSN other than prior to this weekend. Patient is a poor historian.   tPA Given: No, due to alternate possible etiologies and low NIHSS (poor risk/benefit profile)  Past Medical History:  Diagnosis Date  . ANEMIA   . Anxiety   . Asthma   . B12 DEFICIENCY   . Bipolar affective disorder ([place]) 1985   ?nervous breakdown  . BREAST CYST, LEFT   . Breast tumor   . GASTROESOPHAGEAL REFLUX DISEASE   . GOITER   . H/O cardiovascular stress test    Lex MV 4/14:  No ischemia, EF 72%, normal wall motion  . Hyperthyroidism   . INTERSTITIAL LUNG DISEASE   . Syncope   . Thyroid disease    ? overactive thyroid    Past Surgical History:  Procedure Laterality Date  . ABDOMINAL HYSTERECTOMY  06/03/10  . BUNIONECTOMY     bilateral  . GALLBLADDER SURGERY  1985   stones removed  . KNEE SURGERY  1987   bilateral  . LUMBAR LAMINECTOMY/DECOMPRESSION MICRODISCECTOMY Right 01/13/2013   Procedure: MICRODISCECTOMY FORAMINOTOMY OF L4-5 ON THE RIGHT;  Surgeon: Magnus Sinning, MD;  Location: [facility];  Service: Orthopedics;  Laterality: Right;  . MEDIASTINOSCOPY  2001  . STERNOTOMY  2001  .  TONSILLECTOMY    . TUBAL LIGATION  1985, 1996    Family History  Problem Relation Age of Onset  . Cancer Father     ?type  . Hypertension Father   . Diabetes Mother   . Hypertension Mother    Social History:  reports that she has never smoked. She has never used smokeless tobacco. She reports that she does not drink alcohol or use drugs.  Allergies:  Allergies  Allergen Reactions  . Adhesive [Tape] Rash  . Latex Rash    Medications:  Current Meds  Medication Sig  . albuterol (PROVENTIL HFA;VENTOLIN HFA) 108 (90 BASE) MCG/ACT inhaler Inhale 2 puffs into the lungs every 6 (six) hours as needed for wheezing or shortness of breath.   Marland Kitchen amLODipine (NORVASC) 5 MG tablet take 1 tablet by mouth once daily  . estradiol (ESTRACE) 2 MG tablet take 1 tablet by mouth once daily  . lithium 300 MG tablet Take 300-600 mg by mouth See admin instructions. Take 1 tablet every morning and take 2 tablets every night at bedtime.  Marland Kitchen QUEtiapine (SEROQUEL) 300 MG tablet Take 300 mg by mouth at bedtime.     ROS: Chest pain in ED with radiation to right side of face, right shoulder and right arm. No abdominal pain or nausea. No other limb pain. Other ROS as per HPI.   Physical Examination: Blood pressure 149/93, pulse 69,  temperature 98 F (36.7 C), temperature source Oral, resp. rate 18, last menstrual period 04/18/2010, SpO2 100 %.  HEENT: [place]/AT Lungs: Respirations unlabored Extremities: No edema.  Neurologic Examination: Mentation: Intact to complex questions and commands. Exhibited impaired repetition that had functional quality. Intact naming, comprehension and fluency.  CN: PERRL, visual fields intact, EOMI. Decreased sensation right fiace. VII symmetric. No hypophonia. Tongue midline.  Motor: 5/5 x 4 without asymmetry.  Sensory: Intact FT and temp sensation x 4.  Reflexes: Normoactive x 4. Toes downgoing.  Cerebellar: No ataxia on FNF. Gait: Deferred.   Results for orders placed or  performed during the hospital encounter of 06/25/16 (from the past 48 hour(s))  Urinalysis, Routine w reflex microscopic (not at [facility])     Status: None   Collection Time: 06/24/16 11:29 PM  Result Value Ref Range   Color, Urine YELLOW YELLOW   APPearance CLEAR CLEAR   Specific Gravity, Urine 1.011 1.005 - 1.030   pH 6.0 5.0 - 8.0   Glucose, UA NEGATIVE NEGATIVE mg/dL   Hgb urine dipstick NEGATIVE NEGATIVE   Bilirubin Urine NEGATIVE NEGATIVE   Ketones, ur NEGATIVE NEGATIVE mg/dL   Protein, ur NEGATIVE NEGATIVE mg/dL   Nitrite NEGATIVE NEGATIVE   Leukocytes, UA NEGATIVE NEGATIVE    Comment: MICROSCOPIC NOT DONE ON URINES WITH NEGATIVE PROTEIN, BLOOD, LEUKOCYTES, NITRITE, OR GLUCOSE <1000 mg/dL.  CBC with Differential     Status: Abnormal   Collection Time: 06/24/16 11:30 PM  Result Value Ref Range   WBC 6.4 4.0 - 10.5 K/uL   RBC 4.46 3.87 - 5.11 MIL/uL   Hemoglobin 11.9 (L) 12.0 - 15.0 g/dL   HCT 37.8 36.0 - 46.0 %   MCV 84.8 78.0 - 100.0 fL   MCH 26.7 26.0 - 34.0 pg   MCHC 31.5 30.0 - 36.0 g/dL   RDW 13.8 11.5 - 15.5 %   Platelets 134 (L) 150 - 400 K/uL   Neutrophils Relative % 47 %   Neutro Abs 3.0 1.7 - 7.7 K/uL   Lymphocytes Relative 42 %   Lymphs Abs 2.7 0.7 - 4.0 K/uL   Monocytes Relative 8 %   Monocytes Absolute 0.5 0.1 - 1.0 K/uL   Eosinophils Relative 3 %   Eosinophils Absolute 0.2 0.0 - 0.7 K/uL   Basophils Relative 1 %   Basophils Absolute 0.0 0.0 - 0.1 K/uL  Comprehensive metabolic panel     Status: Abnormal   Collection Time: 06/24/16 11:30 PM  Result Value Ref Range   Sodium 138 135 - 145 mmol/L   Potassium 3.2 (L) 3.5 - 5.1 mmol/L   Chloride 104 101 - 111 mmol/L   CO2 26 22 - 32 mmol/L   Glucose, Bld 94 65 - 99 mg/dL   BUN 7 6 - 20 mg/dL   Creatinine, Ser 0.81 0.44 - 1.00 mg/dL   Calcium 9.4 8.9 - 10.3 mg/dL   Total Protein 7.4 6.5 - 8.1 g/dL   Albumin 3.6 3.5 - 5.0 g/dL   AST 23 15 - 41 U/L   ALT 14 14 - 54 U/L   Alkaline Phosphatase 66 38 - 126 U/L    Total Bilirubin 0.5 0.3 - 1.2 mg/dL   GFR calc non Af Amer >60 >60 mL/min   GFR calc Af Amer >60 >60 mL/min    Comment: (NOTE) The eGFR has been calculated using the CKD EPI equation. This calculation has not been validated in all clinical situations. eGFR's persistently <60 mL/min signify possible Chronic Kidney  Disease.    Anion gap 8 5 - 15  Ethanol     Status: None   Collection Time: 06/25/16 12:42 AM  Result Value Ref Range   Alcohol, Ethyl (B) <5 <5 mg/dL    Comment:        LOWEST DETECTABLE LIMIT FOR SERUM ALCOHOL IS 5 mg/dL FOR MEDICAL PURPOSES ONLY   Protime-INR     Status: None   Collection Time: 06/25/16 12:42 AM  Result Value Ref Range   Prothrombin Time 13.9 11.4 - 15.2 seconds   INR 1.07   APTT     Status: None   Collection Time: 06/25/16 12:42 AM  Result Value Ref Range   aPTT 35 24 - 36 seconds  CBC     Status: Abnormal   Collection Time: 06/25/16 12:42 AM  Result Value Ref Range   WBC 6.8 4.0 - 10.5 K/uL   RBC 4.33 3.87 - 5.11 MIL/uL   Hemoglobin 11.5 (L) 12.0 - 15.0 g/dL   HCT 37.0 36.0 - 46.0 %   MCV 85.5 78.0 - 100.0 fL   MCH 26.6 26.0 - 34.0 pg   MCHC 31.1 30.0 - 36.0 g/dL   RDW 13.4 11.5 - 15.5 %   Platelets 172 150 - 400 K/uL  Differential     Status: None   Collection Time: 06/25/16 12:42 AM  Result Value Ref Range   Neutrophils Relative % 45 %   Neutro Abs 3.0 1.7 - 7.7 K/uL   Lymphocytes Relative 45 %   Lymphs Abs 3.0 0.7 - 4.0 K/uL   Monocytes Relative 7 %   Monocytes Absolute 0.5 0.1 - 1.0 K/uL   Eosinophils Relative 2 %   Eosinophils Absolute 0.2 0.0 - 0.7 K/uL   Basophils Relative 1 %   Basophils Absolute 0.1 0.0 - 0.1 K/uL  Comprehensive metabolic panel     Status: Abnormal   Collection Time: 06/25/16 12:42 AM  Result Value Ref Range   Sodium 137 135 - 145 mmol/L   Potassium 3.1 (L) 3.5 - 5.1 mmol/L   Chloride 106 101 - 111 mmol/L   CO2 23 22 - 32 mmol/L   Glucose, Bld 86 65 - 99 mg/dL   BUN 7 6 - 20 mg/dL   Creatinine,  Ser 0.81 0.44 - 1.00 mg/dL   Calcium 9.2 8.9 - 10.3 mg/dL   Total Protein 6.8 6.5 - 8.1 g/dL   Albumin 3.6 3.5 - 5.0 g/dL   AST 21 15 - 41 U/L   ALT 13 (L) 14 - 54 U/L   Alkaline Phosphatase 66 38 - 126 U/L   Total Bilirubin 0.4 0.3 - 1.2 mg/dL   GFR calc non Af Amer >60 >60 mL/min   GFR calc Af Amer >60 >60 mL/min    Comment: (NOTE) The eGFR has been calculated using the CKD EPI equation. This calculation has not been validated in all clinical situations. eGFR's persistently <60 mL/min signify possible Chronic Kidney Disease.    Anion gap 8 5 - 15  I-stat troponin, ED (not at [facility], [facility])     Status: None   Collection Time: 06/25/16 12:55 AM  Result Value Ref Range   Troponin i, poc 0.01 0.00 - 0.08 ng/mL   Comment 3            Comment: Due to the release kinetics of cTnI, a negative result within the first hours of the onset of symptoms does not rule out myocardial infarction with certainty. If myocardial infarction  is still suspected, repeat the test at appropriate intervals.   I-Stat Chem 8, ED  (not at [facility], [facility])     Status: Abnormal   Collection Time: 06/25/16 12:56 AM  Result Value Ref Range   Sodium 138 135 - 145 mmol/L   Potassium 5.7 (H) 3.5 - 5.1 mmol/L   Chloride 103 101 - 111 mmol/L   BUN 8 6 - 20 mg/dL   Creatinine, Ser 0.80 0.44 - 1.00 mg/dL   Glucose, Bld 84 65 - 99 mg/dL   Calcium, Ion 1.08 (L) 1.15 - 1.40 mmol/L   TCO2 28 0 - 100 mmol/L   Hemoglobin 13.6 12.0 - 15.0 g/dL   HCT 40.0 36.0 - 46.0 %   Ct Head Code Stroke W/o Cm  Result Date: 06/25/2016 CLINICAL DATA:  Code stroke.  Right-sided face and arm weakness EXAM: CT HEAD WITHOUT CONTRAST TECHNIQUE: Contiguous axial images were obtained from the base of the skull through the vertex without intravenous contrast. COMPARISON:  Head CT 10/15/2013 FINDINGS: Brain: No mass lesion, intraparenchymal hemorrhage or extra-axial collection. No evidence of acute cortical infarct. Mildly age advanced atrophy. Mild  periventricular hypoattenuation. Vascular: No hyperdense vessel or unexpected calcification. Skull: Normal visualized skull base, calvarium and extracranial soft tissues. Sinuses/Orbits: No sinus fluid levels or advanced mucosal thickening. No mastoid effusion. Normal orbits. ASPECTS [facility] Stroke Program Early CT Score) - Ganglionic level infarction (caudate, lentiform nuclei, internal capsule, insula, M1-M3 cortex): 7 - Supraganglionic infarction (M4-M6 cortex): 3 Total score (0-10 with 10 being normal): 10 IMPRESSION: 1. No acute intracranial abnormality. 2. ASPECTS is 10. 3. Mildly age advanced atrophy. These results were called by telephone at the time of interpretation on 06/25/2016 at 1:11 am to Dr. Ezequiel Essex , who verbally acknowledged these results. Electronically Signed   By: Ulyses Jarred M.D.   On: 06/25/2016 01:10    Assessment: 55 y.o. female with right facial numbness and right sided headache.  1. CT head negative.  2. Risk/benefit profile militates against tPA given NIHSS of 1 and no clear dysphasia. Discussed with patient who agrees not to proceed with tPA. Her symptoms may also be secondary to severe HTN.  3. Headache. Trial of IV phenergan x 1 ordered.   Stroke Risk Factors - HTN  Plan: 1. HgbA1c, fasting lipid panel 2. MRI, MRA  of the brain without contrast 3. PT consult, OT consult, Speech consult 4. Echocardiogram 5. Carotid dopplers 6. Prophylactic therapy- Start ASA 81 mg po qd 7. Risk factor modification 8. Telemetry monitoring 9. Frequent neuro checks 10. B12 level.  11. Not an optimal candidate for permissive HTN given that hypertensive urgency may be the underlying etiology for her headache and sensory symptoms. Treat her HTN and monitor for possible improvement in symptoms.    @Electronically  signed: Dr. Kerney Elbe   @strokeconsult @ 06/25/2016, 1:36 AM

## 2016-06-25 NOTE — ED Notes (Addendum)
No neuro deficits noted. Speech clear. VSS. NIH= 1. Next neuro check due at 0400. Passed Stroke Swallow Screen. Patient ambulates with standby assist. Still needs UA.  Dagoberto Reef, RN  [phone].

## 2016-06-25 NOTE — Care Management Obs Status (Signed)
[facility] NOTIFICATION   Patient Details  Name: Melynie Burling MRN: [id number] Date of Birth: 09-25-1960   Medicare Observation Status Notification Given:  Yes (MRI negative)    Pollie Friar, RN 06/25/2016, 3:49 PM

## 2016-06-25 NOTE — Progress Notes (Signed)
STROKE TEAM PROGRESS NOTE   HISTORY OF PRESENT ILLNESS (per record) Athalene Uthoff Krenz is an 55 y.o. female who was in her normal state of health until a trip to [place] over the weekend with her girlfriends. The social mood was tense per patient and she began experiencing a right sided headache, which persisted during the remainder of the trip. An argument ensued, which made her headache worse. When she returned to [facility], her headache was still present and she tried to "wait it out". Today she began experiencing right sided sensory numbness so she presented for further evaluation. Her headache has been associated with elevated BPs at home (200+/190). In the ED she experienced some chest pain following CT and an EKG with Troponin was ordered. No clear LSN other than prior to this weekend. Patient is a poor historian. Patient was not administered IV t-PA secondary to due to alternate possible etiologies and low NIHSS (poor risk/benefit profile). She was admitted for further evaluation and treatment.   SUBJECTIVE (INTERVAL HISTORY) No family at bedside. She stated that her HA improved from yesterday but not resolved yet. Right side numbness much improved. MRI and MRA showed no stroke and no significant vessel narrowing.   OBJECTIVE Temp:  [97.8 F (36.6 C)-98.8 F (37.1 C)] 97.8 F (36.6 C) (11/08 0941) Pulse Rate:  [65-88] 76 (11/08 0941) Cardiac Rhythm: Heart block (11/08 0415) Resp:  [15-18] 18 (11/08 0941) BP: (103-151)/(64-98) 111/64 (11/08 0941) SpO2:  [96 %-100 %] 96 % (11/08 0941) Weight:  [80 kg (176 lb 4.8 oz)] 80 kg (176 lb 4.8 oz) (11/08 0400)  CBC:  Recent Labs Lab 06/24/16 2330 06/25/16 0042 06/25/16 0056  WBC 6.4 6.8  --   NEUTROABS 3.0 3.0  --   HGB 11.9* 11.5* 13.6  HCT 37.8 37.0 40.0  MCV 84.8 85.5  --   PLT 134* 172  --     Basic Metabolic Panel:  Recent Labs Lab 06/25/16 0042 06/25/16 0056 06/25/16 1035  NA 137 138 139  K 3.1* 5.7* 3.1*  CL 106 103  106  CO2 23  --  26  GLUCOSE 86 84 139*  BUN 7 8 5*  CREATININE 0.81 0.80 0.78  CALCIUM 9.2  --  8.9    Lipid Panel:    Component Value Date/Time   CHOL 148 06/25/2016 0501   TRIG 75 06/25/2016 0501   HDL 55 06/25/2016 0501   CHOLHDL 2.7 06/25/2016 0501   VLDL 15 06/25/2016 0501   LDLCALC 78 06/25/2016 0501   HgbA1c: No results found for: HGBA1C Urine Drug Screen:    Component Value Date/Time   LABOPIA NONE DETECTED 06/25/2016 0537   COCAINSCRNUR NONE DETECTED 06/25/2016 0537   LABBENZ NONE DETECTED 06/25/2016 0537   AMPHETMU NONE DETECTED 06/25/2016 0537   THCU NONE DETECTED 06/25/2016 0537   LABBARB NONE DETECTED 06/25/2016 0537      IMAGING I have personally reviewed the radiological images below and agree with the radiology interpretations.  Mr Brain Wo Contrast Mr [name] Head/brain Wo Cm 06/25/2016 1. No acute intracranial abnormality. 2. Mild chronic small vessel ischemic disease and mild to moderate cerebral atrophy. 3. No major intracranial branch vessel occlusion or significant stenosis. 4. 2 mm right cavernous ICA aneurysm.   Ct Head Code Stroke W/o Cm 06/25/2016 1. No acute intracranial abnormality. 2. ASPECTS is 10. 3. Mildly age advanced atrophy.   CUS pending  TTE pending   PHYSICAL EXAM  Temp:  [97.8 F (36.6 C)-98.8 F (37.1  C)] 97.8 F (36.6 C) (11/08 0941) Pulse Rate:  [65-88] 76 (11/08 0945) Resp:  [15-18] 18 (11/08 0941) BP: (103-151)/(64-98) 111/64 (11/08 0941) SpO2:  [96 %-100 %] 96 % (11/08 0941) Weight:  [176 lb 4.8 oz (80 kg)] 176 lb 4.8 oz (80 kg) (11/08 0400)  General - Well nourished, well developed, lethargic.  Ophthalmologic - Fundi not visualized due to noncooperation.  Cardiovascular - Regular rate and rhythm.  Mental Status -  Level of arousal and orientation to time, place, and person were intact. Language including expression, naming, repetition, comprehension was assessed and found intact. Fund of Knowledge was assessed  and was intact.  Cranial Nerves II - XII - II - Visual field intact OU. III, IV, VI - Extraocular movements intact. V - Facial sensation decreased on the right. VII - Facial movement intact bilaterally. VIII - Hearing & vestibular intact bilaterally. X - Palate elevates symmetrically. XI - Chin turning & shoulder shrug intact bilaterally. XII - Tongue protrusion intact.  Motor Strength - The patient's strength was normal in all extremities and pronator drift was absent.  Bulk was normal and fasciculations were absent.   Motor Tone - Muscle tone was assessed at the neck and appendages and was normal.  Reflexes - The patient's reflexes were 1+ in all extremities and she had no pathological reflexes.  Sensory - Light touch, temperature/pinprick were assessed and were symmetrical.    Coordination - The patient had normal movements in the hands and feet with no ataxia or dysmetria.  Tremor was absent.  Gait and Station - deferred due to lethargy   ASSESSMENT/PLAN Ms. Kae Wharff Tibbett is a 55 y.o. female with history of HTN, anemia, and bipolar disorder presenting with Right sided headache accompanied by right sided sensory numbness. She did not receive IV t-PA due to non-stroke presentation and low NIH stroke scale.   Complicated migraine/headache - TIA unlikely. Pt had recent stress which likely trigger the HA.   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 Yes; Fluid consistency: Thin  No antithrombotic prior to admission, now on aspirin 81 mg daily . Continue on discharge.   Ongoing aggressive stroke risk factor management  Therapy recommendations:  pending  Disposition:  pending  Hypertension  Stable  BP goal normotensive  Bipolar disorder  On lithium - however, lithium  level was low  On seroquel 300mg    Continue home meds  Other Active Problems  Hypokalemia - supplement  Prolonged QTC  Hospital day # 0  Rosalin Hawking, MD PhD Stroke Neurology 06/25/2016 2:32 PM    To contact Stroke Continuity provider, please refer to [email or web address] After hours, contact General Neurology

## 2016-06-25 NOTE — Progress Notes (Signed)
[name], no noted distress. Minimal pain, patient notes from back surgery. Patient has a lump which was after her surgery. Held BP med resulted in results (see flowsheet). Patient has been sleeping most of day. She has been educated on meds and usage of call light system. Staff will continue to monitor and meet needs.

## 2016-06-26 LAB — HEMOGLOBIN A1C
Hgb A1c MFr Bld: 5.2 % (ref 4.8–5.6)
Mean Plasma Glucose: 103 mg/dL

## 2016-06-27 LAB — VAS US CAROTID
LCCADDIAS: -27 cm/s
LCCAPSYS: 97 cm/s
LEFT ECA DIAS: -19 cm/s
LEFT VERTEBRAL DIAS: -30 cm/s
LICADDIAS: -31 cm/s
LICADSYS: -60 cm/s
LICAPDIAS: -30 cm/s
LICAPSYS: -67 cm/s
Left CCA dist sys: -76 cm/s
Left CCA prox dias: 36 cm/s
RIGHT ECA DIAS: -18 cm/s
RIGHT VERTEBRAL DIAS: -12 cm/s
Right CCA prox dias: -31 cm/s
Right CCA prox sys: -111 cm/s
Right cca dist sys: -68 cm/s

## 2016-10-08 ENCOUNTER — Telehealth: Payer: Self-pay | Admitting: Gastroenterology

## 2016-10-08 NOTE — Telephone Encounter (Signed)
Patient brought in previous GI records to be reviewed by doctor. Patient was a former Dr.Patterson patient but then transferred care to [place]. Patient wants to come back to our office due to location. Patient states she is having stomach pains and wants to be seen for an ov. Not requesting any certain doctor so records placed on DOD Dr.Stark's desk for review.

## 2016-10-09 NOTE — Telephone Encounter (Signed)
Dr. Fuller Plan reviewed records and declined to accept patient.  "Pt decided to seek Gi care from other GI providers and left [facility]".  LM on Vmail to CB

## 2016-10-28 ENCOUNTER — Emergency Department (HOSPITAL_COMMUNITY): Payer: Medicare Other

## 2016-10-28 ENCOUNTER — Emergency Department (HOSPITAL_COMMUNITY)
Admission: EM | Admit: 2016-10-28 | Discharge: 2016-10-28 | Disposition: A | Discharge status: Home / Self Care | Payer: Medicare Other | Attending: Emergency Medicine | Admitting: Emergency Medicine

## 2016-10-28 ENCOUNTER — Encounter (HOSPITAL_COMMUNITY): Payer: Self-pay | Admitting: Emergency Medicine

## 2016-10-28 DIAGNOSIS — J45909 Unspecified asthma, uncomplicated: Secondary | ICD-10-CM | POA: Diagnosis not present

## 2016-10-28 DIAGNOSIS — R109 Unspecified abdominal pain: Secondary | ICD-10-CM | POA: Diagnosis present

## 2016-10-28 DIAGNOSIS — I10 Essential (primary) hypertension: Secondary | ICD-10-CM | POA: Diagnosis not present

## 2016-10-28 DIAGNOSIS — K591 Functional diarrhea: Secondary | ICD-10-CM | POA: Diagnosis not present

## 2016-10-28 DIAGNOSIS — Z91040 Latex allergy status: Secondary | ICD-10-CM | POA: Insufficient documentation

## 2016-10-28 LAB — CBC
HEMATOCRIT: 37.2 % (ref 36.0–46.0)
Hemoglobin: 11.5 g/dL — ABNORMAL LOW (ref 12.0–15.0)
MCH: 26.7 pg (ref 26.0–34.0)
MCHC: 30.9 g/dL (ref 30.0–36.0)
MCV: 86.5 fL (ref 78.0–100.0)
PLATELETS: 169 10*3/uL (ref 150–400)
RBC: 4.3 MIL/uL (ref 3.87–5.11)
RDW: 14.3 % (ref 11.5–15.5)
WBC: 7.7 10*3/uL (ref 4.0–10.5)

## 2016-10-28 LAB — COMPREHENSIVE METABOLIC PANEL
ALBUMIN: 3.9 g/dL (ref 3.5–5.0)
ALK PHOS: 67 U/L (ref 38–126)
ALT: 12 U/L — ABNORMAL LOW (ref 14–54)
AST: 18 U/L (ref 15–41)
Anion gap: 4 — ABNORMAL LOW (ref 5–15)
BILIRUBIN TOTAL: 0.5 mg/dL (ref 0.3–1.2)
BUN: 6 mg/dL (ref 6–20)
CO2: 25 mmol/L (ref 22–32)
CREATININE: 0.74 mg/dL (ref 0.44–1.00)
Calcium: 9.3 mg/dL (ref 8.9–10.3)
Chloride: 107 mmol/L (ref 101–111)
GFR calc Af Amer: >60 mL/min (ref >60)
GLUCOSE: 89 mg/dL (ref 65–99)
POTASSIUM: 4 mmol/L (ref 3.5–5.1)
Sodium: 136 mmol/L (ref 135–145)
TOTAL PROTEIN: 7.5 g/dL (ref 6.5–8.1)

## 2016-10-28 LAB — URINALYSIS, ROUTINE W REFLEX MICROSCOPIC
BILIRUBIN URINE: NEGATIVE
Glucose, UA: NEGATIVE mg/dL
Hgb urine dipstick: NEGATIVE
KETONES UR: NEGATIVE mg/dL
LEUKOCYTES UA: NEGATIVE
NITRITE: NEGATIVE
PH: 7 (ref 5.0–8.0)
PROTEIN: NEGATIVE mg/dL
Specific Gravity, Urine: 1.005 (ref 1.005–1.030)

## 2016-10-28 LAB — LIPASE, BLOOD: Lipase: 20 U/L (ref 11–51)

## 2016-10-28 MED ORDER — IOPAMIDOL (ISOVUE-300) INJECTION 61%
30.0000 mL | Freq: Once | INTRAVENOUS | Status: AC | PRN
  Administered 2016-10-28: 30 mL via ORAL

## 2016-10-28 MED ORDER — SODIUM CHLORIDE 0.9 % IV BOLUS (SEPSIS)
1000.0000 mL | Freq: Once | INTRAVENOUS | Status: AC
  Administered 2016-10-28: 1000 mL via INTRAVENOUS

## 2016-10-28 MED ORDER — IOPAMIDOL (ISOVUE-300) INJECTION 61%
100.0000 mL | Freq: Once | INTRAVENOUS | Status: AC | PRN
  Administered 2016-10-28: 100 mL via INTRAVENOUS

## 2016-10-28 MED ORDER — IOPAMIDOL (ISOVUE-300) INJECTION 61%
INTRAVENOUS | Status: AC
  Filled 2016-10-28: qty 100

## 2016-10-28 MED ORDER — IOPAMIDOL (ISOVUE-300) INJECTION 61%
INTRAVENOUS | Status: AC
  Filled 2016-10-28: qty 30

## 2016-10-28 MED ORDER — METHYLCELLULOSE (LAXATIVE) PO POWD: 1.0000 | Freq: Every day | ORAL | 0 refills | Status: DC

## 2016-10-28 NOTE — ED Provider Notes (Signed)
[facility] DEPT Provider Note   CSN: [id number] Arrival date & time: 10/28/16  8127   History   Chief Complaint Chief Complaint  Patient presents with  . Abdominal Pain  . Diarrhea  . Emesis    HPI Lynn Gonzalez is a 56 y.o. female.  HPI Pt has been having trouble with diarrhea and abdominal pain since June.  She was seeing a doctor for this but became frustrated.  She was given several different medications that did not help.  Pt states it was a GI specialist but she does not remember the name. She said no tests were run. Pt has loose stools every time she eats and shortly after she eats.    It happens sometimes more than 4-5 times per day.  Sometimes she has vomiting but the primary issue is the frequent loose stools.  Pt has not been able to treat properly.   No foreign travel.  No camping, out in the woods.  Baseline weight is 178.  Pt was seeing [facility] and asked to be seen at [facility] at the end of February according to the records.  She was declined.  Past Medical History:  Diagnosis Date  . ANEMIA   . Anxiety   . Asthma   . B12 DEFICIENCY   . Bipolar affective disorder ([place]) 1985   ?nervous breakdown  . BREAST CYST, LEFT   . Breast tumor   . GASTROESOPHAGEAL REFLUX DISEASE   . GOITER   . H/O cardiovascular stress test    Lex MV 4/14:  No ischemia, EF 72%, normal wall motion  . Hyperthyroidism   . INTERSTITIAL LUNG DISEASE   . Syncope   . Thyroid disease    ? overactive thyroid    Patient Active Problem List   Diagnosis Date Noted  . Paresthesias 06/25/2016  . Headache 06/25/2016  . Hypokalemia 06/25/2016  . Prolonged Q-T interval on ECG 06/25/2016  . Thumb numbness 10/28/2013  . Hypertension 10/24/2013  . Hip pain 06/21/2013  . Low back pain radiating to right leg 05/04/2013  . Elevated blood pressure 10/12/2012  . Snoring 04/07/2012  . GERD (gastroesophageal reflux disease) 01/22/2012  . Gout 03/12/2011  . GOITER 10/21/2010  . BREAST CYST,  LEFT 07/23/2010  . B12 DEFICIENCY 03/08/2010  . Bipolar 1 disorder ([place]) 03/08/2010  . HYPERTHYROIDISM 02/11/2010  . ANEMIA 02/11/2010  . INTERSTITIAL LUNG DISEASE 02/11/2010  . GASTROESOPHAGEAL REFLUX DISEASE 02/11/2010    Past Surgical History:  Procedure Laterality Date  . ABDOMINAL HYSTERECTOMY  06/03/10  . BUNIONECTOMY     bilateral  . GALLBLADDER SURGERY  1985   stones removed  . KNEE SURGERY  1987   bilateral  . LUMBAR LAMINECTOMY/DECOMPRESSION MICRODISCECTOMY Right 01/13/2013   Procedure: MICRODISCECTOMY FORAMINOTOMY OF L4-5 ON THE RIGHT;  Surgeon: Magnus Sinning, MD;  Location: [facility];  Service: Orthopedics;  Laterality: Right;  . MEDIASTINOSCOPY  2001  . STERNOTOMY  2001  . TONSILLECTOMY    . [place]    OB History    No data available       Home Medications    Prior to Admission medications   Medication Sig Start Date End Date Taking? Authorizing Provider  albuterol (PROVENTIL HFA;VENTOLIN HFA) 108 (90 BASE) MCG/ACT inhaler Inhale 2 puffs into the lungs every 6 (six) hours as needed for wheezing or shortness of breath.    Yes Historical Provider, MD  amLODipine (NORVASC) 5 MG tablet take 1 tablet by mouth  once daily 08/23/14  Yes Debbrah Alar, NP  estradiol (ESTRACE) 2 MG tablet take 1 tablet by mouth once daily 08/23/14  Yes Debbrah Alar, NP  lithium 300 MG tablet Take 300-600 mg by mouth 2 (two) times daily. Pt takes one tablet in the morning and two at bedtime.   Yes Historical Provider, MD  QUEtiapine (SEROQUEL) 300 MG tablet Take 300 mg by mouth at bedtime.   Yes Historical Provider, MD  methylcellulose (CITRUCEL) oral powder Take 1 packet by mouth daily. 10/28/16   Dorie Rank, MD    Family History Family History  Problem Relation Age of Onset  . Cancer Father     ?type  . Hypertension Father   . Diabetes Mother   . Hypertension Mother     Social History Social History  Substance Use Topics  . Smoking status: Never  Smoker  . Smokeless tobacco: Never Used  . Alcohol use No     Allergies   Adhesive [tape] and Latex   Review of Systems Review of Systems  Constitutional: Positive for unexpected weight change. Negative for fever.  Gastrointestinal: Negative for blood in stool.  All other systems reviewed and are negative.    Physical Exam Updated Vital Signs BP 112/77 (BP Location: Left Arm)   Pulse 70   Temp 97.9 F (36.6 C)   Resp 16   Ht 5\' 6"  (1.676 m)   Wt 78.5 kg   LMP 04/18/2010   SpO2 98%   BMI 27.92 kg/m   Physical Exam  Constitutional: She appears well-developed and well-nourished. No distress.  HENT:  Head: Normocephalic and atraumatic.  Right Ear: External ear normal.  Left Ear: External ear normal.  Eyes: Conjunctivae are normal. Right eye exhibits no discharge. Left eye exhibits no discharge. No scleral icterus.  Neck: Neck supple. No tracheal deviation present.  Cardiovascular: Normal rate, regular rhythm and intact distal pulses.   Pulmonary/Chest: Effort normal and breath sounds normal. No stridor. No respiratory distress. She has no wheezes. She has no rales.  Abdominal: Soft. Bowel sounds are normal. She exhibits no distension. There is generalized tenderness. There is no rebound and no guarding.  Mild in the lower quadrants  Musculoskeletal: She exhibits no edema or tenderness.  Neurological: She is alert. She has normal strength. No cranial nerve deficit (no facial droop, extraocular movements intact, no slurred speech) or sensory deficit. She exhibits normal muscle tone. She displays no seizure activity. Coordination normal.  Skin: Skin is warm and dry. No rash noted.  Psychiatric: She has a normal mood and affect.  Nursing note and vitals reviewed.    ED Treatments / Results  Labs (all labs ordered are listed, but only abnormal results are displayed) Labs Reviewed  COMPREHENSIVE METABOLIC PANEL - Abnormal; Notable for the following:       Result Value     ALT 12 (*)    Anion gap 4 (*)    All other components within normal limits  CBC - Abnormal; Notable for the following:    Hemoglobin 11.5 (*)    All other components within normal limits  URINALYSIS, ROUTINE W REFLEX MICROSCOPIC - Abnormal; Notable for the following:    Color, Urine STRAW (*)    All other components within normal limits  LIPASE, BLOOD    Radiology Ct Abdomen Pelvis W Contrast  Result Date: 10/28/2016 CLINICAL DATA:  Abdominal pain, diarrhea, vomiting since June 2017. EXAM: CT ABDOMEN AND PELVIS WITH CONTRAST TECHNIQUE: Multidetector CT imaging of the abdomen  and pelvis was performed using the standard protocol following bolus administration of intravenous contrast. CONTRAST:  159mL ISOVUE-300 IOPAMIDOL (ISOVUE-300) INJECTION 61% COMPARISON:  None FINDINGS: Lower chest: Heart is borderline in size. No acute findings in the lung bases. No effusions. Hepatobiliary: Prior cholecystectomy. No focal hepatic abnormality or biliary ductal dilatation. Pancreas: No focal abnormality or ductal dilatation. Spleen: No focal abnormality.  Normal size. Adrenals/Urinary Tract: No adrenal abnormality. No focal renal abnormality. No stones or hydronephrosis. Urinary bladder is unremarkable. Stomach/Bowel: Sigmoid and descending colonic diverticulosis. No active diverticulitis. Moderate stool burden in the left colon. Stomach and small bowel decompressed. Vascular/Lymphatic: No evidence of aneurysm or adenopathy. Reproductive: Prior hysterectomy.  No adnexal masses. Other: No free fluid or free air. Musculoskeletal: No acute bony abnormality. IMPRESSION: No acute findings in the abdomen or pelvis. Left colonic diverticulosis.  Moderate stool burden. Prior cholecystectomy. Electronically Signed   By: Rolm Baptise M.D.   On: 10/28/2016 12:41    Procedures Procedures (including critical care time)  Medications Ordered in ED Medications  sodium chloride 0.9 % bolus 1,000 mL (0 mLs Intravenous  Stopped 10/28/16 1237)  iopamidol (ISOVUE-300) 61 % injection 30 mL (30 mLs Oral Contrast Given 10/28/16 1016)  iopamidol (ISOVUE-300) 61 % injection 100 mL (100 mLs Intravenous Contrast Given 10/28/16 1220)     Initial Impression / Assessment and Plan / ED Course  I have reviewed the triage vital signs and the nursing notes.  Pertinent labs & imaging results that were available during my care of the patient were reviewed by me and considered in my medical decision making (see chart for details).   patient's laboratory tests are reassuring. CT scan does not show any evidence of colitis. She does have findings to suggest constipation. Possible the patient is having loose stool coming around the colonic constipation.   Pt is stable for discharge.  Follow up with GI.  Discussed increasing her fiber intake in the meantime  Final Clinical Impressions(s) / ED Diagnoses   Final diagnoses:  Functional diarrhea    New Prescriptions New Prescriptions   METHYLCELLULOSE (CITRUCEL) ORAL POWDER    Take 1 packet by mouth daily.     Dorie Rank, MD 10/28/16 [phone]

## 2016-10-28 NOTE — Discharge Instructions (Signed)
Follow-up with your primary care doctor. Consider seeing a gastroenterologist as we discussed. Try taking Citrucel fiber supplement to help with the diarrhea as we discussed

## 2016-10-28 NOTE — ED Triage Notes (Signed)
Patient c/o abd pain, diarrhea and vomiting since June 2017. Patient states that she has seen a MD "but I aint seeing him no more!"  Patient states that pain is mid abd and 10/10. Diarrhea is more than vomiting but she hasnt kept anything sown since last week.

## 2016-10-28 NOTE — ED Notes (Signed)
MD is with pt.

## 2016-10-28 NOTE — ED Notes (Signed)
Informed of need for urine.  Will try after pt has drinks contrast.
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
## 2017-01-16 ENCOUNTER — Emergency Department (HOSPITAL_COMMUNITY)
Admission: EM | Admit: 2017-01-16 | Discharge: 2017-01-16 | Disposition: A | Discharge status: Home / Self Care | Payer: Medicare Other | Attending: Emergency Medicine | Admitting: Emergency Medicine

## 2017-01-16 ENCOUNTER — Encounter (HOSPITAL_COMMUNITY): Payer: Self-pay | Admitting: Emergency Medicine

## 2017-01-16 ENCOUNTER — Emergency Department (HOSPITAL_COMMUNITY): Payer: Medicare Other

## 2017-01-16 DIAGNOSIS — Z7951 Long term (current) use of inhaled steroids: Secondary | ICD-10-CM | POA: Insufficient documentation

## 2017-01-16 DIAGNOSIS — Z79899 Other long term (current) drug therapy: Secondary | ICD-10-CM | POA: Insufficient documentation

## 2017-01-16 DIAGNOSIS — J45909 Unspecified asthma, uncomplicated: Secondary | ICD-10-CM | POA: Diagnosis not present

## 2017-01-16 DIAGNOSIS — M25532 Pain in left wrist: Secondary | ICD-10-CM | POA: Diagnosis not present

## 2017-01-16 DIAGNOSIS — Y92811 Bus as the place of occurrence of the external cause: Secondary | ICD-10-CM | POA: Diagnosis not present

## 2017-01-16 DIAGNOSIS — Y999 Unspecified external cause status: Secondary | ICD-10-CM | POA: Diagnosis not present

## 2017-01-16 DIAGNOSIS — M25552 Pain in left hip: Secondary | ICD-10-CM | POA: Insufficient documentation

## 2017-01-16 DIAGNOSIS — I10 Essential (primary) hypertension: Secondary | ICD-10-CM | POA: Diagnosis not present

## 2017-01-16 DIAGNOSIS — M25512 Pain in left shoulder: Secondary | ICD-10-CM | POA: Diagnosis not present

## 2017-01-16 DIAGNOSIS — S79912A Unspecified injury of left hip, initial encounter: Secondary | ICD-10-CM | POA: Diagnosis present

## 2017-01-16 DIAGNOSIS — W19XXXA Unspecified fall, initial encounter: Secondary | ICD-10-CM

## 2017-01-16 DIAGNOSIS — Y939 Activity, unspecified: Secondary | ICD-10-CM | POA: Diagnosis not present

## 2017-01-16 DIAGNOSIS — M25562 Pain in left knee: Secondary | ICD-10-CM | POA: Diagnosis not present

## 2017-01-16 DIAGNOSIS — W010XXA Fall on same level from slipping, tripping and stumbling without subsequent striking against object, initial encounter: Secondary | ICD-10-CM | POA: Insufficient documentation

## 2017-01-16 MED ORDER — KETOROLAC TROMETHAMINE 60 MG/2ML IM SOLN
15.0000 mg | Freq: Once | INTRAMUSCULAR | Status: AC
  Administered 2017-01-16: 15 mg via INTRAMUSCULAR
  Filled 2017-01-16: qty 2
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
## 2017-01-16 NOTE — Discharge Instructions (Signed)
Alternate 600 mg of ibuprofen and [phone] mg of Tylenol every 3 hours. Apply ice or heat to the affected areas for comfort. Gently stretch in the shower. Follow-up with your primary care for reevaluation in one week. Return to the ED if any concerning symptoms develop.  Use Moist heat therapy by taking a dish rag and soaking in water then ringing out excess water then microwave for 10-15 seconds until hot but not so hot that it would burn and heat to areas of soreness for 15 minutes, multiple times a day.

## 2017-01-16 NOTE — Progress Notes (Signed)
Orthopedic Tech Progress Note Patient Details:  Lynn Gonzalez Mar 25, 1961 [id number]  Ortho Devices Type of Ortho Device: Crutches, Knee Sleeve Ortho Device/Splint Location: Lt Knee Ortho Device/Splint Interventions: Ordered, Application   [name] 01/16/2017, 11:58 AM

## 2017-02-10 ENCOUNTER — Encounter: Payer: Self-pay | Admitting: Physical Therapy

## 2017-02-10 ENCOUNTER — Ambulatory Visit: Payer: Medicare Other | Attending: Specialist | Admitting: Physical Therapy

## 2017-02-10 DIAGNOSIS — M25552 Pain in left hip: Secondary | ICD-10-CM

## 2017-02-10 DIAGNOSIS — R2689 Other abnormalities of gait and mobility: Secondary | ICD-10-CM

## 2017-02-10 DIAGNOSIS — M62838 Other muscle spasm: Secondary | ICD-10-CM | POA: Diagnosis present

## 2017-02-10 DIAGNOSIS — G8929 Other chronic pain: Secondary | ICD-10-CM | POA: Diagnosis present

## 2017-02-10 DIAGNOSIS — M25562 Pain in left knee: Secondary | ICD-10-CM | POA: Diagnosis present

## 2017-02-10 DIAGNOSIS — M6281 Muscle weakness (generalized): Secondary | ICD-10-CM
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
## 2017-02-23 ENCOUNTER — Ambulatory Visit: Payer: Medicare Other | Attending: Specialist | Admitting: Physical Therapy

## 2017-02-23 DIAGNOSIS — M6281 Muscle weakness (generalized): Secondary | ICD-10-CM

## 2017-02-23 DIAGNOSIS — R2689 Other abnormalities of gait and mobility: Secondary | ICD-10-CM | POA: Diagnosis present

## 2017-02-23 DIAGNOSIS — M25562 Pain in left knee: Secondary | ICD-10-CM | POA: Diagnosis present

## 2017-02-23 DIAGNOSIS — M62838 Other muscle spasm: Secondary | ICD-10-CM | POA: Diagnosis present

## 2017-02-23 DIAGNOSIS — G8929 Other chronic pain: Secondary | ICD-10-CM

## 2017-02-23 DIAGNOSIS — M25552 Pain in left hip: Secondary | ICD-10-CM

## 2017-02-23 NOTE — Therapy (Signed)
[place], [place], [address] Phone: [phone]   Fax:  [phone]  Physical Therapy Treatment  Patient Details  Name: Lynn Gonzalez MRN: [id number] Date of Birth: 12-Mar-1961 Referring Provider: Ruthy Dick PA-C  Encounter Date: 02/23/2017      PT End of Session - 02/23/17 0905    Visit Number 2   Number of Visits 13   Date for PT Re-Evaluation 03/24/17   Authorization Type MCR: Kx mod by 15th, progress note by 10th or 7/26    PT Start Time 0845   PT Stop Time 0940   PT Time Calculation (min) 55 min      Past Medical History:  Diagnosis Date  . ANEMIA   . Anxiety   . Asthma   . B12 DEFICIENCY   . Bipolar affective disorder ([place]) 1985   ?nervous breakdown  . BREAST CYST, LEFT   . Breast tumor   . GASTROESOPHAGEAL REFLUX DISEASE   . GOITER   . H/O cardiovascular stress test    Lex MV 4/14:  No ischemia, EF 72%, normal wall motion  . Hyperthyroidism   . INTERSTITIAL LUNG DISEASE   . Syncope   . Thyroid disease    ? overactive thyroid    Past Surgical History:  Procedure Laterality Date  . ABDOMINAL HYSTERECTOMY  06/03/10  . BUNIONECTOMY     bilateral  . GALLBLADDER SURGERY  1985   stones removed  . KNEE SURGERY  1987   bilateral  . LUMBAR LAMINECTOMY/DECOMPRESSION MICRODISCECTOMY Right 01/13/2013   Procedure: MICRODISCECTOMY FORAMINOTOMY OF L4-5 ON THE RIGHT;  Surgeon: Magnus Sinning, MD;  Location: [facility];  Service: Orthopedics;  Laterality: Right;  . MEDIASTINOSCOPY  2001  . STERNOTOMY  2001  . TONSILLECTOMY    . [place]    There were no vitals filed for this visit.      Subjective Assessment - 02/23/17 0848    Subjective I feel like I do the exercises better with someone helping me.    Currently in Pain? Yes   Pain Score 6    Pain Location Hip   Pain Orientation Left   Pain Descriptors / Indicators Sharp   Aggravating Factors  prolonged  sitting, then sit-stand, prolonged walking or standing   Pain Relieving Factors laying down, sitting short periods    Pain Score 6   Pain Location Knee   Pain Orientation Left   Pain Descriptors / Indicators Sharp   Aggravating Factors  walking, prolonged standing, cleaning    Pain Relieving Factors resting, sitting                         [facility] Adult PT Treatment/Exercise - 02/23/17 0001      Knee/Hip Exercises: Stretches   Hip Flexor Stretch 60 seconds;2 reps   ITB Stretch 1 rep;60 seconds   ITB Stretch Limitations sidelying   Piriformis Stretch Limitations figure 4 push and pull 3 x 30 sec each Left    Other Knee/Hip Stretches pelvic tilt x 10      Knee/Hip Exercises: Aerobic   Nustep L4 LE only x 5 minutes      Knee/Hip Exercises: Supine   Short Arc Quad Sets Limitations with ball squeeze x 5, increased pain in hip , done without ball over bolster x 20    Bridges 10 reps   Bridges Limitations mini lifts    Other Supine  Knee/Hip Exercises supine marching x 10 with abdominal bracing   Other Supine Knee/Hip Exercises ball squeeze x 10      Modalities   Modalities Moist Heat     Moist Heat Therapy   Number Minutes Moist Heat 15 Minutes   Moist Heat Location Hip  lateral hip and thigh     Manual Therapy   Manual Therapy Soft tissue mobilization   Soft tissue mobilization massage roller to left ITB, glute med, vastus lateralis                  PT Short Term Goals - 02/10/17 1302      PT SHORT TERM GOAL #1   Title pt to be I with inital HEP (03/03/2017)   Time 3   Period Weeks   Status New     PT SHORT TERM GOAL #2   Title pt to verbalize and demo techniques to reduce pain and inflammation via RICE and HEP (03/03/2017)   Time 3   Period Weeks   Status New     PT SHORT TERM GOAL #3   Title pt will demonstrate decreased vastus lateralis / glute med tightness to reduce pain to </= 5/10 in the hip and knee (03/03/2017)   Time 3   Period  Weeks   Status New           PT Long Term Goals - 02/10/17 1304      PT LONG TERM GOAL #1   Title increase knee flexion to >/= 120 and extension 0 degreesand increase hip flexion/ extension by >/= 8 degrees with </= 1/10 pain for functional and efficient gait pattern (03/24/2017)   Time 6   Period Weeks   Status New     PT LONG TERM GOAL #2   Title pt will increase over LLE strength to >/= 4/5 with </= 2/10 pain for to provide stability for the hip/ knee with standing/ walking activities (03/24/2017)   Time 6   Period Weeks   Status New     PT LONG TERM GOAL #3   Title pt will be able to sit/ walk or stand for >/= 45 min with </= 2/10 pain for functional endurance required for community ambulation and ADLs (03/24/2017)   Time 6   Period Weeks   Status New     PT LONG TERM GOAL #4   Title increase FOTO score to </= 40% limited to demo improvment in function (03/24/2017)   Time 6   Period Weeks   Status New     PT LONG TERM GOAL #5   Title pt to be I with all HEP given as of last visit (03/24/2017)   Time 6   Period Weeks   Status New               Plan - 02/23/17 1208    Clinical Impression Statement Pt reports less pain at 6/10 which stil limits prolonged positions and walking/standing tolerance. She is performing HEP. We reviewed all exercises with pt reporting increased pain with SAQ/ball squeeze. Began Nustep today with pt reporting muscle burning, not pain. Began massage rolling to Lateral thigh with HMP following to decrease tenderness. Added lateral hip stretches to HEP.    PT Next Visit Plan assess/ review and updated HEP PRN, manual over vastus lateralis/ glute medius, hip/knee strengthening, patellar taping? modalities PRN   PT Home Exercise Plan SAQ with ball squeeze, ITB stretch, supine marching, hip flexor stretch, figure 4 push  and pull   Consulted and Agree with Plan of Care Patient      Patient will benefit from skilled therapeutic intervention in order  to improve the following deficits and impairments:  Abnormal gait, Pain, Improper body mechanics, Postural dysfunction, Decreased endurance, Decreased activity tolerance, Decreased balance, Increased fascial restricitons, Decreased strength  Visit Diagnosis: Pain in left hip  Chronic pain of left knee  Muscle weakness (generalized)  Other abnormalities of gait and mobility  Other muscle spasm     Problem List Patient Active Problem List   Diagnosis Date Noted  . Paresthesias 06/25/2016  . Headache 06/25/2016  . Hypokalemia 06/25/2016  . Prolonged Q-T interval on ECG 06/25/2016  . Thumb numbness 10/28/2013  . Hypertension 10/24/2013  . Hip pain 06/21/2013  . Low back pain radiating to right leg 05/04/2013  . Elevated blood pressure 10/12/2012  . Snoring 04/07/2012  . GERD (gastroesophageal reflux disease) 01/22/2012  . Gout 03/12/2011  . GOITER 10/21/2010  . BREAST CYST, LEFT 07/23/2010  . B12 DEFICIENCY 03/08/2010  . Bipolar 1 disorder ([place]) 03/08/2010  . HYPERTHYROIDISM 02/11/2010  . ANEMIA 02/11/2010  . INTERSTITIAL LUNG DISEASE 02/11/2010  . GASTROESOPHAGEAL REFLUX DISEASE 02/11/2010    Dorene Ar, PTA 02/23/2017, 12:11 PM  [facility] [address] [place], [place], [address] Phone: [phone]   Fax:  [phone]  Name: Lynn Gonzalez MRN: [id number] Date of Birth: 03/11/1961

## 2017-02-26 ENCOUNTER — Ambulatory Visit: Payer: Medicare Other | Admitting: Physical Therapy

## 2017-02-26 ENCOUNTER — Encounter: Payer: Self-pay | Admitting: Physical Therapy

## 2017-02-26 DIAGNOSIS — R2689 Other abnormalities of gait and mobility: Secondary | ICD-10-CM

## 2017-02-26 DIAGNOSIS — G8929 Other chronic pain: Secondary | ICD-10-CM

## 2017-02-26 DIAGNOSIS — M25552 Pain in left hip: Secondary | ICD-10-CM | POA: Diagnosis not present

## 2017-02-26 DIAGNOSIS — M62838 Other muscle spasm: Secondary | ICD-10-CM

## 2017-02-26 DIAGNOSIS — M6281 Muscle weakness (generalized): Secondary | ICD-10-CM

## 2017-02-26 DIAGNOSIS — M25562 Pain in left knee: Secondary | ICD-10-CM

## 2017-02-26 NOTE — Therapy (Signed)
[place], [place], [address] Phone: [phone]   Fax:  [phone]  Physical Therapy Treatment  Patient Details  Name: Lynn Gonzalez MRN: [id number] Date of Birth: [birth year] Referring Provider: Ruthy Dick PA-C  Encounter Date: 02/26/2017      PT End of Session - 02/26/17 0927    Visit Number 3   Number of Visits 13   Date for PT Re-Evaluation 03/24/17   PT Start Time 0845   PT Stop Time 0928   PT Time Calculation (min) 43 min   Activity Tolerance Patient tolerated treatment well   Behavior During Therapy [facility] for tasks assessed/performed      Past Medical History:  Diagnosis Date  . ANEMIA   . Anxiety   . Asthma   . B12 DEFICIENCY   . Bipolar affective disorder ([place]) 1985   ?nervous breakdown  . BREAST CYST, LEFT   . Breast tumor   . GASTROESOPHAGEAL REFLUX DISEASE   . GOITER   . H/O cardiovascular stress test    Lex MV 4/14:  No ischemia, EF 72%, normal wall motion  . Hyperthyroidism   . INTERSTITIAL LUNG DISEASE   . Syncope   . Thyroid disease    ? overactive thyroid    Past Surgical History:  Procedure Laterality Date  . ABDOMINAL HYSTERECTOMY  06/03/10  . BUNIONECTOMY     bilateral  . GALLBLADDER SURGERY  1985   stones removed  . KNEE SURGERY  1987   bilateral  . LUMBAR LAMINECTOMY/DECOMPRESSION MICRODISCECTOMY Right 01/13/2013   Procedure: MICRODISCECTOMY FORAMINOTOMY OF L4-5 ON THE RIGHT;  Surgeon: Magnus Sinning, MD;  Location: [facility];  Service: Orthopedics;  Laterality: Right;  . MEDIASTINOSCOPY  2001  . STERNOTOMY  2001  . TONSILLECTOMY    . [place]    There were no vitals filed for this visit.      Subjective Assessment - 02/26/17 0850    Subjective "I was alittle sore this morning, I do my exercises every day"    Currently in Pain? Yes   Pain Score 7    Pain Location Hip   Pain Orientation Left   Pain Type Chronic pain   Pain  Onset More than a month ago   Pain Frequency Constant   Pain Score 6   Pain Location Knee   Pain Orientation Left   Pain Descriptors / Indicators Aching;Sharp;Sore   Pain Type Chronic pain   Pain Onset More than a month ago   Pain Frequency Constant                         [facility] Adult PT Treatment/Exercise - 02/26/17 0851      Knee/Hip Exercises: Stretches   Active Hamstring Stretch 2 reps;30 seconds   Hip Flexor Stretch 2 reps;30 seconds     Knee/Hip Exercises: Aerobic   Nustep L4 x 5 min LE/ UE     Knee/Hip Exercises: Supine   Short Arc Quad Sets Strengthening;Left;3 sets;10 reps  with ball squeeze for VMO facilitation   Bridges 2 sets;10 reps;Both;Strengthening  with ball squeeze between knees     Knee/Hip Exercises: Sidelying   Hip ABduction 2 sets;10 reps;Left  clam shell     Manual Therapy   Manual Therapy Joint mobilization   Manual therapy comments manual trigger point release over the L vastus lateralis and glute medius   Joint Mobilization L hip long  axis distration grade 4, posterior hip mobs grade 3-4    Soft tissue mobilization massage roller to left ITB/ vastus lateralis, glute med, vastus lateralis                PT Education - 02/26/17 0924    Education provided Yes   Education Details eduaction regarding nerves and benefits of gradual exercise to help reduce pain.   performed during Nu-step   Person(s) Educated Patient   Methods Explanation;Verbal cues   Comprehension Verbalized understanding;Verbal cues required          PT Short Term Goals - 02/10/17 1302      PT SHORT TERM GOAL #1   Title pt to be I with inital HEP (03/03/2017)   Time 3   Period Weeks   Status New     PT SHORT TERM GOAL #2   Title pt to verbalize and demo techniques to reduce pain and inflammation via RICE and HEP (03/03/2017)   Time 3   Period Weeks   Status New     PT SHORT TERM GOAL #3   Title pt will demonstrate decreased vastus lateralis  / glute med tightness to reduce pain to </= 5/10 in the hip and knee (03/03/2017)   Time 3   Period Weeks   Status New           PT Long Term Goals - 02/10/17 1304      PT LONG TERM GOAL #1   Title increase knee flexion to >/= 120 and extension 0 degreesand increase hip flexion/ extension by >/= 8 degrees with </= 1/10 pain for functional and efficient gait pattern (03/24/2017)   Time 6   Period Weeks   Status New     PT LONG TERM GOAL #2   Title pt will increase over LLE strength to >/= 4/5 with </= 2/10 pain for to provide stability for the hip/ knee with standing/ walking activities (03/24/2017)   Time 6   Period Weeks   Status New     PT LONG TERM GOAL #3   Title pt will be able to sit/ walk or stand for >/= 45 min with </= 2/10 pain for functional endurance required for community ambulation and ADLs (03/24/2017)   Time 6   Period Weeks   Status New     PT LONG TERM GOAL #4   Title increase FOTO score to </= 40% limited to demo improvment in function (03/24/2017)   Time 6   Period Weeks   Status New     PT LONG TERM GOAL #5   Title pt to be I with all HEP given as of last visit (03/24/2017)   Time 6   Period Weeks   Status New               Plan - 02/26/17 0762    Clinical Impression Statement cointinued focus on hip/ knee manual to decrease vastus lateralis and glute med tightness. strengthening to promote VMO facilitation and hip aduction/ extension strength. opted to avoid McConnel taping due to Adhesive allergy. pt reported 5/10 pain post session.    PT Next Visit Plan update HEP PRN,  manual over vastus lateralis/ glute medius, hip/knee strengthening, modalities PRN, VMO facilitation techniques, hip mobs   PT Home Exercise Plan SAQ with ball squeeze, ITB stretch, supine marching, hip flexor stretch, figure 4 push and pull   Consulted and Agree with Plan of Care Patient      Patient will  benefit from skilled therapeutic intervention in order to improve the  following deficits and impairments:  Abnormal gait, Pain, Improper body mechanics, Postural dysfunction, Decreased endurance, Decreased activity tolerance, Decreased balance, Increased fascial restricitons, Decreased strength  Visit Diagnosis: Pain in left hip  Chronic pain of left knee  Muscle weakness (generalized)  Other abnormalities of gait and mobility  Other muscle spasm     Problem List Patient Active Problem List   Diagnosis Date Noted  . Paresthesias 06/25/2016  . Headache 06/25/2016  . Hypokalemia 06/25/2016  . Prolonged Q-T interval on ECG 06/25/2016  . Thumb numbness 10/28/2013  . Hypertension 10/24/2013  . Hip pain 06/21/2013  . Low back pain radiating to right leg 05/04/2013  . Elevated blood pressure 10/12/2012  . Snoring 04/07/2012  . GERD (gastroesophageal reflux disease) 01/22/2012  . Gout 03/12/2011  . GOITER 10/21/2010  . BREAST CYST, LEFT 07/23/2010  . B12 DEFICIENCY 03/08/2010  . Bipolar 1 disorder ([place]) 03/08/2010  . HYPERTHYROIDISM 02/11/2010  . ANEMIA 02/11/2010  . INTERSTITIAL LUNG DISEASE 02/11/2010  . GASTROESOPHAGEAL REFLUX DISEASE 02/11/2010   Starr [place] PT, DPT, LAT, ATC  02/26/17  9:31 AM      [facility] [address] [place], [place], [address] Phone: [phone]   Fax:  [phone]  Name: Lynn Gonzalez MRN: [id number] Date of Birth: 1960-11-02

## 2017-03-02 ENCOUNTER — Ambulatory Visit: Payer: Medicare Other | Admitting: Physical Therapy

## 2017-03-02 DIAGNOSIS — G8929 Other chronic pain: Secondary | ICD-10-CM

## 2017-03-02 DIAGNOSIS — M25552 Pain in left hip: Secondary | ICD-10-CM | POA: Diagnosis not present

## 2017-03-02 DIAGNOSIS — R2689 Other abnormalities of gait and mobility: Secondary | ICD-10-CM

## 2017-03-02 DIAGNOSIS — M6281 Muscle weakness (generalized): Secondary | ICD-10-CM

## 2017-03-02 DIAGNOSIS — M62838 Other muscle spasm: Secondary | ICD-10-CM

## 2017-03-02 DIAGNOSIS — M25562 Pain in left knee: Principal | ICD-10-CM

## 2017-03-02 NOTE — Therapy (Signed)
[place], [place], [address] Phone: [phone]   Fax:  [phone]  Physical Therapy Treatment  Patient Details  Name: Lynn Gonzalez MRN: [id number] Date of Birth: November 12, 1960 Referring Provider: Ruthy Dick PA-C  Encounter Date: 03/02/2017      PT End of Session - 03/02/17 0859    Visit Number 4   Number of Visits 13   Date for PT Re-Evaluation 03/24/17   Authorization Type MCR: Kx mod by 15th, progress note by 10th or 7/26    PT Start Time 0842   PT Stop Time 0942   PT Time Calculation (min) 60 min      Past Medical History:  Diagnosis Date  . ANEMIA   . Anxiety   . Asthma   . B12 DEFICIENCY   . Bipolar affective disorder ([place]) 1985   ?nervous breakdown  . BREAST CYST, LEFT   . Breast tumor   . GASTROESOPHAGEAL REFLUX DISEASE   . GOITER   . H/O cardiovascular stress test    Lex MV 4/14:  No ischemia, EF 72%, normal wall motion  . Hyperthyroidism   . INTERSTITIAL LUNG DISEASE   . Syncope   . Thyroid disease    ? overactive thyroid    Past Surgical History:  Procedure Laterality Date  . ABDOMINAL HYSTERECTOMY  06/03/10  . BUNIONECTOMY     bilateral  . GALLBLADDER SURGERY  1985   stones removed  . KNEE SURGERY  1987   bilateral  . LUMBAR LAMINECTOMY/DECOMPRESSION MICRODISCECTOMY Right 01/13/2013   Procedure: MICRODISCECTOMY FORAMINOTOMY OF L4-5 ON THE RIGHT;  Surgeon: Magnus Sinning, MD;  Location: [facility];  Service: Orthopedics;  Laterality: Right;  . MEDIASTINOSCOPY  2001  . STERNOTOMY  2001  . TONSILLECTOMY    . [place]    There were no vitals filed for this visit.      Subjective Assessment - 03/02/17 0847    Subjective Some days are better.    Currently in Pain? No/denies            [facility] PT Assessment - 03/02/17 0001      AROM   Left Hip Flexion 75   Left Knee Extension -4   Left Knee Flexion 125                      [facility] Adult PT Treatment/Exercise - 03/02/17 0001      Knee/Hip Exercises: Stretches   Active Hamstring Stretch 2 reps;30 seconds   Hip Flexor Stretch 2 reps;30 seconds   Piriformis Stretch Limitations figure 4 push and pull 3 x 30 sec each Left    Other Knee/Hip Stretches pelvic tilt x 10      Knee/Hip Exercises: Aerobic   Nustep L5 x 5 minutes      Knee/Hip Exercises: Supine   Short Arc Quad Sets Strengthening;Left;3 sets;10 reps  with ball squeeze for VMO facilitation   Bridges 2 sets;10 reps;Both;Strengthening  with ball squeeze between knees     Knee/Hip Exercises: Sidelying   Hip ABduction 2 sets;10 reps;Left  clam shell     Moist Heat Therapy   Number Minutes Moist Heat 15 Minutes   Moist Heat Location Hip  lateral hip and thigh     Manual Therapy   Soft tissue mobilization massage roller to left ITB, glute med, vastus lateralis  PT Short Term Goals - 02/10/17 1302      PT SHORT TERM GOAL #1   Title pt to be I with inital HEP (03/03/2017)   Time 3   Period Weeks   Status New     PT SHORT TERM GOAL #2   Title pt to verbalize and demo techniques to reduce pain and inflammation via RICE and HEP (03/03/2017)   Time 3   Period Weeks   Status New     PT SHORT TERM GOAL #3   Title pt will demonstrate decreased vastus lateralis / glute med tightness to reduce pain to </= 5/10 in the hip and knee (03/03/2017)   Time 3   Period Weeks   Status New           PT Long Term Goals - 02/10/17 1304      PT LONG TERM GOAL #1   Title increase knee flexion to >/= 120 and extension 0 degreesand increase hip flexion/ extension by >/= 8 degrees with </= 1/10 pain for functional and efficient gait pattern (03/24/2017)   Time 6   Period Weeks   Status New     PT LONG TERM GOAL #2   Title pt will increase over LLE strength to >/= 4/5 with </= 2/10 pain for to provide stability for the hip/ knee with standing/ walking activities (03/24/2017)   Time  6   Period Weeks   Status New     PT LONG TERM GOAL #3   Title pt will be able to sit/ walk or stand for >/= 45 min with </= 2/10 pain for functional endurance required for community ambulation and ADLs (03/24/2017)   Time 6   Period Weeks   Status New     PT LONG TERM GOAL #4   Title increase FOTO score to </= 40% limited to demo improvment in function (03/24/2017)   Time 6   Period Weeks   Status New     PT LONG TERM GOAL #5   Title pt to be I with all HEP given as of last visit (03/24/2017)   Time 6   Period Weeks   Status New               Plan - 03/02/17 5638    Clinical Impression Statement Pt reports no pain today. She noticies some days are less painful. Continued strengthening and flexibility monitoring for pain.    PT Next Visit Plan CHECK GOALS update HEP PRN,  manual over vastus lateralis/ glute medius, hip/knee strengthening, modalities PRN, VMO facilitation techniques, hip mobs   PT Home Exercise Plan SAQ with ball squeeze, ITB stretch, supine marching, hip flexor stretch, figure 4 push and pull   Consulted and Agree with Plan of Care Patient      Patient will benefit from skilled therapeutic intervention in order to improve the following deficits and impairments:  Abnormal gait, Pain, Improper body mechanics, Postural dysfunction, Decreased endurance, Decreased activity tolerance, Decreased balance, Increased fascial restricitons, Decreased strength  Visit Diagnosis: Chronic pain of left knee  Other abnormalities of gait and mobility  Other muscle spasm  Muscle weakness (generalized)  Pain in left hip     Problem List Patient Active Problem List   Diagnosis Date Noted  . Paresthesias 06/25/2016  . Headache 06/25/2016  . Hypokalemia 06/25/2016  . Prolonged Q-T interval on ECG 06/25/2016  . Thumb numbness 10/28/2013  . Hypertension 10/24/2013  . Hip pain 06/21/2013  . Low back pain  radiating to right leg 05/04/2013  . Elevated blood pressure  10/12/2012  . Snoring 04/07/2012  . GERD (gastroesophageal reflux disease) 01/22/2012  . Gout 03/12/2011  . GOITER 10/21/2010  . BREAST CYST, LEFT 07/23/2010  . B12 DEFICIENCY 03/08/2010  . Bipolar 1 disorder ([place]) 03/08/2010  . HYPERTHYROIDISM 02/11/2010  . ANEMIA 02/11/2010  . INTERSTITIAL LUNG DISEASE 02/11/2010  . GASTROESOPHAGEAL REFLUX DISEASE 02/11/2010    Dorene Ar, PTA 03/02/2017, 9:39 AM  [facility] [address] [place], [place], [address] Phone: [phone]   Fax:  [phone]  Name: Lynn Gonzalez MRN: [id number] Date of Birth: August 09, 1961

## 2017-03-05 ENCOUNTER — Ambulatory Visit: Payer: Medicare Other | Admitting: Physical Therapy

## 2017-03-05 VITALS — BP 120/82 | HR 74

## 2017-03-05 DIAGNOSIS — M62838 Other muscle spasm: Secondary | ICD-10-CM

## 2017-03-05 DIAGNOSIS — M25552 Pain in left hip: Secondary | ICD-10-CM

## 2017-03-05 DIAGNOSIS — M6281 Muscle weakness (generalized): Secondary | ICD-10-CM

## 2017-03-05 DIAGNOSIS — M25562 Pain in left knee: Principal | ICD-10-CM

## 2017-03-05 DIAGNOSIS — R2689 Other abnormalities of gait and mobility: Secondary | ICD-10-CM

## 2017-03-05 DIAGNOSIS — G8929 Other chronic pain: Secondary | ICD-10-CM

## 2017-03-05 NOTE — Therapy (Signed)
[place], [place], [address] Phone: [phone]   Fax:  [phone]  Physical Therapy Treatment  Patient Details  Name: Lynn Gonzalez MRN: [id number] Date of Birth: [birth year] Referring Provider: Ruthy Dick PA-C  Encounter Date: 03/05/2017      PT End of Session - 03/05/17 0851    Visit Number 5   Number of Visits 13   Date for PT Re-Evaluation 03/24/17   Authorization Type MCR: Kx mod by 15th, progress note by 10th or 7/26    PT Start Time 0845   PT Stop Time 0908  Ended early due to High BP   PT Time Calculation (min) 23 min      Past Medical History:  Diagnosis Date  . ANEMIA   . Anxiety   . Asthma   . B12 DEFICIENCY   . Bipolar affective disorder ([place]) 1985   ?nervous breakdown  . BREAST CYST, LEFT   . Breast tumor   . GASTROESOPHAGEAL REFLUX DISEASE   . GOITER   . H/O cardiovascular stress test    Lex MV 4/14:  No ischemia, EF 72%, normal wall motion  . Hyperthyroidism   . INTERSTITIAL LUNG DISEASE   . Syncope   . Thyroid disease    ? overactive thyroid    Past Surgical History:  Procedure Laterality Date  . ABDOMINAL HYSTERECTOMY  06/03/10  . BUNIONECTOMY     bilateral  . GALLBLADDER SURGERY  1985   stones removed  . KNEE SURGERY  1987   bilateral  . LUMBAR LAMINECTOMY/DECOMPRESSION MICRODISCECTOMY Right 01/13/2013   Procedure: MICRODISCECTOMY FORAMINOTOMY OF L4-5 ON THE RIGHT;  Surgeon: Magnus Sinning, MD;  Location: [facility];  Service: Orthopedics;  Laterality: Right;  . MEDIASTINOSCOPY  2001  . STERNOTOMY  2001  . TONSILLECTOMY    . TUBAL LIGATION  1985, 1996    Vitals:   03/05/17 0902 03/05/17 0909  BP: (!) 156/110 120/82  Pulse: 74 74  SpO2: 99% 99%        Subjective Assessment - 03/05/17 0850    Subjective Had sharp pain in my knee last night when going to sleep    Currently in Pain? No/denies                         [facility]  Adult PT Treatment/Exercise - 03/05/17 0001      Self-Care   Self-Care Other Self-Care Comments   Other Self-Care Comments  Use of wrist BP cuff, need for a different monitor due to descrepency with reading verses manual cuff. Sign and symptoms indicating need to go to ED related to High BP     Knee/Hip Exercises: Aerobic   Nustep L5 x 7 minutes, which checking functional goals                   PT Short Term Goals - 03/05/17 3846      PT SHORT TERM GOAL #1   Title pt to be I with inital HEP (03/03/2017)   Time 3   Period Weeks   Status Achieved     PT SHORT TERM GOAL #2   Title pt to verbalize and demo techniques to reduce pain and inflammation via RICE and HEP (03/03/2017)   Time 3   Period Weeks   Status Achieved     PT SHORT TERM GOAL #3   Title pt will demonstrate decreased vastus lateralis / glute med  tightness to reduce pain to </= 5/10 in the hip and knee (03/03/2017)   Time 3   Period Weeks   Status Achieved           PT Long Term Goals - 03/05/17 4650      PT LONG TERM GOAL #1   Title increase knee flexion to >/= 120 and extension 0 degreesand increase hip flexion/ extension by >/= 8 degrees with </= 1/10 pain for functional and efficient gait pattern (03/24/2017)   Time 6   Period Weeks   Status Unable to assess     PT LONG TERM GOAL #2   Title pt will increase over LLE strength to >/= 4/5 with </= 2/10 pain for to provide stability for the hip/ knee with standing/ walking activities (03/24/2017)   Time 6   Period Weeks   Status Unable to assess     PT LONG TERM GOAL #3   Title pt will be able to sit/ walk or stand for >/= 45 min with </= 2/10 pain for functional endurance required for community ambulation and ADLs (03/24/2017)   Baseline pain with inclines, otherwise no pain with flat surfaces    Time 6   Period Weeks   Status Partially Met     PT LONG TERM GOAL #4   Title increase FOTO score to </= 40% limited to demo improvment in function  (03/24/2017)   Time 6   Period Weeks   Status Unable to assess     PT LONG TERM GOAL #5   Title pt to be I with all HEP given as of last visit (03/24/2017)   Time 6   Period Weeks   Status On-going               Plan - 03/05/17 0914    Clinical Impression Statement Pt arrives with reports of high BP and lightheadedness. She has a primary care MD appointment immediately following PT today. Patient with questions about proper use of wrist BP cuff. In clinic, wrist cuff measures averaged 160/110 however our automatic arm BP cuff measured 127/96 and manual cuff 120/82. Decided to end PT treatement for today and send her to her MD.  She has noticed an increase in walking/standing toerance and walks around shopping center for a couple of hours every evening. She is independent with her HEP and understands RICE. All STGs MET. LTG#3 partially met.    PT Next Visit Plan How was MD appt? check BP, CHECK GOALS update HEP PRN ,  manual over vastus lateralis/ glute medius, hip/knee strengthening, modalities PRN, VMO facilitation techniques, hip mobs   PT Home Exercise Plan SAQ with ball squeeze, ITB stretch, supine marching, hip flexor stretch, figure 4 push and pull   Consulted and Agree with Plan of Care Patient      Patient will benefit from skilled therapeutic intervention in order to improve the following deficits and impairments:  Abnormal gait, Pain, Improper body mechanics, Postural dysfunction, Decreased endurance, Decreased activity tolerance, Decreased balance, Increased fascial restricitons, Decreased strength  Visit Diagnosis: Chronic pain of left knee  Other abnormalities of gait and mobility  Other muscle spasm  Muscle weakness (generalized)  Pain in left hip     Problem List Patient Active Problem List   Diagnosis Date Noted  . Paresthesias 06/25/2016  . Headache 06/25/2016  . Hypokalemia 06/25/2016  . Prolonged Q-T interval on ECG 06/25/2016  . Thumb numbness  10/28/2013  . Hypertension 10/24/2013  . Hip pain  06/21/2013  . Low back pain radiating to right leg 05/04/2013  . Elevated blood pressure 10/12/2012  . Snoring 04/07/2012  . GERD (gastroesophageal reflux disease) 01/22/2012  . Gout 03/12/2011  . GOITER 10/21/2010  . BREAST CYST, LEFT 07/23/2010  . B12 DEFICIENCY 03/08/2010  . Bipolar 1 disorder ([place]) 03/08/2010  . HYPERTHYROIDISM 02/11/2010  . ANEMIA 02/11/2010  . INTERSTITIAL LUNG DISEASE 02/11/2010  . GASTROESOPHAGEAL REFLUX DISEASE 02/11/2010    Dorene Ar , PTA 03/05/2017, 9:27 AM  [place], [place], [address] Phone: [phone]   Fax:  [phone]  Name: Malijah Lietz MRN: [id number] Date of Birth: 1960-12-11

## 2017-03-10 ENCOUNTER — Ambulatory Visit: Payer: Medicare Other | Admitting: Physical Therapy

## 2017-03-10 VITALS — BP 134/98 | HR 74

## 2017-03-10 DIAGNOSIS — M25562 Pain in left knee: Principal | ICD-10-CM

## 2017-03-10 DIAGNOSIS — R2689 Other abnormalities of gait and mobility: Secondary | ICD-10-CM

## 2017-03-10 DIAGNOSIS — M6281 Muscle weakness (generalized): Secondary | ICD-10-CM

## 2017-03-10 DIAGNOSIS — M62838 Other muscle spasm: Secondary | ICD-10-CM

## 2017-03-10 DIAGNOSIS — M25552 Pain in left hip: Secondary | ICD-10-CM | POA: Diagnosis not present

## 2017-03-10 DIAGNOSIS — G8929 Other chronic pain: Secondary | ICD-10-CM

## 2017-03-10 NOTE — Therapy (Signed)
[place], [place], [address] Phone: [phone]   Fax:  [phone]  Physical Therapy Treatment  Patient Details  Name: Lynn Gonzalez MRN: [id number] Date of Birth: [birth year] Referring Provider: Ruthy Dick PA-C  Encounter Date: 03/10/2017      PT End of Session - 03/10/17 0854    Visit Number 6   Number of Visits 13   Date for PT Re-Evaluation 03/24/17   Authorization Type MCR: Kx mod by 15th, progress note by 10th or 7/26    PT Start Time 0845   PT Stop Time 0945   PT Time Calculation (min) 60 min      Past Medical History:  Diagnosis Date  . ANEMIA   . Anxiety   . Asthma   . B12 DEFICIENCY   . Bipolar affective disorder ([place]) 1985   ?nervous breakdown  . BREAST CYST, LEFT   . Breast tumor   . GASTROESOPHAGEAL REFLUX DISEASE   . GOITER   . H/O cardiovascular stress test    Lex MV 4/14:  No ischemia, EF 72%, normal wall motion  . Hyperthyroidism   . INTERSTITIAL LUNG DISEASE   . Syncope   . Thyroid disease    ? overactive thyroid    Past Surgical History:  Procedure Laterality Date  . ABDOMINAL HYSTERECTOMY  06/03/10  . BUNIONECTOMY     bilateral  . GALLBLADDER SURGERY  1985   stones removed  . KNEE SURGERY  1987   bilateral  . LUMBAR LAMINECTOMY/DECOMPRESSION MICRODISCECTOMY Right 01/13/2013   Procedure: MICRODISCECTOMY FORAMINOTOMY OF L4-5 ON THE RIGHT;  Surgeon: Magnus Sinning, MD;  Location: [facility];  Service: Orthopedics;  Laterality: Right;  . MEDIASTINOSCOPY  2001  . STERNOTOMY  2001  . TONSILLECTOMY    . [place]:   03/10/17 0851  BP: (!) 134/98  Pulse: 74        Subjective Assessment - 03/10/17 0852    Subjective Only have hip pain if I do alot of walking up to 7-9/10    Currently in Pain? No/denies   Aggravating Factors  prolonged standing/walking, cleaining house    Pain Relieving Factors laying down, short sitting  rest break    Pain Score 0   Pain Location Knee   Pain Orientation Left            [facility] PT Assessment - 03/10/17 0001      AROM   Left Hip Flexion 105     Strength   Right Hip Flexion 4-/5   Right Hip Extension 3+/5   Right Hip ABduction 4-/5   Left Hip Flexion 4/5   Left Hip Extension 3+/5   Left Hip ABduction 4/5   Left Knee Flexion 4+/5   Left Knee Extension 4+/5                     [facility] Adult PT Treatment/Exercise - 03/10/17 0001      Lumbar Exercises: Stretches   Single Knee to Chest Stretch 10 seconds;3 reps   Pelvic Tilt Limitations --   Piriformis Stretch Limitations figure 4 push and pull 3 x 30 sec each Left      Knee/Hip Exercises: Stretches   Other Knee/Hip Stretches pelvic tilt x 20, max cues for abdominal draw in      Knee/Hip Exercises: Supine   Bridges Limitations x 10, x 5 with clam shell.  Straight Leg Raises 10 reps;Both   Straight Leg Raises Limitations with abdominal draw in    Other Supine Knee/Hip Exercises supine clam red band x 10    Other Supine Knee/Hip Exercises ball squeeze x 10  with abdominal draw in     Knee/Hip Exercises: Sidelying   Hip ABduction 10 reps     Knee/Hip Exercises: Prone   Hip Extension 10 reps;Both     Moist Heat Therapy   Number Minutes Moist Heat 15 Minutes   Moist Heat Location Hip                  PT Short Term Goals - 03/05/17 4654      PT SHORT TERM GOAL #1   Title pt to be I with inital HEP (03/03/2017)   Time 3   Period Weeks   Status Achieved     PT SHORT TERM GOAL #2   Title pt to verbalize and demo techniques to reduce pain and inflammation via RICE and HEP (03/03/2017)   Time 3   Period Weeks   Status Achieved     PT SHORT TERM GOAL #3   Title pt will demonstrate decreased vastus lateralis / glute med tightness to reduce pain to </= 5/10 in the hip and knee (03/03/2017)   Time 3   Period Weeks   Status Achieved           PT Long Term Goals - 03/10/17 1047       PT LONG TERM GOAL #1   Title increase knee flexion to >/= 120 and extension 0 degreesand increase hip flexion/ extension by >/= 8 degrees with </= 1/10 pain for functional and efficient gait pattern (03/24/2017)   Baseline -5 knee extension   Time 6   Period Weeks   Status Partially Met     PT LONG TERM GOAL #2   Title pt will increase over LLE strength to >/= 4/5 with </= 2/10 pain for to provide stability for the hip/ knee with standing/ walking activities (03/24/2017)   Baseline 3+ to 4/5    Time 6   Period Weeks   Status Partially Met     PT LONG TERM GOAL #3   Title pt will be able to sit/ walk or stand for >/= 45 min with </= 2/10 pain for functional endurance required for community ambulation and ADLs (03/24/2017)   Baseline pain with inclines, otherwise no pain with flat surfaces    Time 6   Period Weeks   Status Partially Met     PT LONG TERM GOAL #4   Title increase FOTO score to </= 40% limited to demo improvment in function (03/24/2017)   Status Unable to assess     PT LONG TERM GOAL #5   Title pt to be I with all HEP given as of last visit (03/24/2017)   Status On-going               Plan - 03/10/17 1010    Clinical Impression Statement Pt reports she "did not make it" to the MD for BP check after last visit and was reluctant to tell me why. She is scheduled to see MD this thursday regarding BP. BP measured at 138/98. We decided to stay on the mat for therex today. Her hip AROM flexion is improved, her knee strength and some hip strength has improved on left side. Her right hip is unchanged. LTG# 1,2,3 partially met.    PT Next Visit  Plan How was MD appt? check BP, CHECK GOALS update HEP PRN ,  manual over vastus lateralis/ glute medius, hip/knee strengthening, modalities PRN, VMO facilitation techniques, hip mobs   PT Home Exercise Plan SAQ with ball squeeze, ITB stretch, supine marching, hip flexor stretch, figure 4 push and pull   Consulted and Agree with Plan  of Care Patient      Patient will benefit from skilled therapeutic intervention in order to improve the following deficits and impairments:  Abnormal gait, Pain, Improper body mechanics, Postural dysfunction, Decreased endurance, Decreased activity tolerance, Decreased balance, Increased fascial restricitons, Decreased strength  Visit Diagnosis: Chronic pain of left knee  Other abnormalities of gait and mobility  Other muscle spasm  Muscle weakness (generalized)  Pain in left hip     Problem List Patient Active Problem List   Diagnosis Date Noted  . Paresthesias 06/25/2016  . Headache 06/25/2016  . Hypokalemia 06/25/2016  . Prolonged Q-T interval on ECG 06/25/2016  . Thumb numbness 10/28/2013  . Hypertension 10/24/2013  . Hip pain 06/21/2013  . Low back pain radiating to right leg 05/04/2013  . Elevated blood pressure 10/12/2012  . Snoring 04/07/2012  . GERD (gastroesophageal reflux disease) 01/22/2012  . Gout 03/12/2011  . GOITER 10/21/2010  . BREAST CYST, LEFT 07/23/2010  . B12 DEFICIENCY 03/08/2010  . Bipolar 1 disorder ([place]) 03/08/2010  . HYPERTHYROIDISM 02/11/2010  . ANEMIA 02/11/2010  . INTERSTITIAL LUNG DISEASE 02/11/2010  . GASTROESOPHAGEAL REFLUX DISEASE 02/11/2010    Dorene Ar , PTA 03/10/2017, 1:07 PM  [facility] [address] [place], [place], [address] Phone: [phone]   Fax:  [phone]  Name: Lynn Gonzalez MRN: [id number] Date of Birth: 24-Nov-1960

## 2017-03-12 ENCOUNTER — Ambulatory Visit: Payer: Medicare Other | Admitting: Physical Therapy

## 2017-03-12 VITALS — BP 118/84

## 2017-03-12 DIAGNOSIS — M6281 Muscle weakness (generalized): Secondary | ICD-10-CM

## 2017-03-12 DIAGNOSIS — G8929 Other chronic pain: Secondary | ICD-10-CM

## 2017-03-12 DIAGNOSIS — M25552 Pain in left hip: Secondary | ICD-10-CM | POA: Diagnosis not present

## 2017-03-12 DIAGNOSIS — M62838 Other muscle spasm: Secondary | ICD-10-CM

## 2017-03-12 DIAGNOSIS — M25562 Pain in left knee: Principal | ICD-10-CM

## 2017-03-12 DIAGNOSIS — R2689 Other abnormalities of gait and mobility: Secondary | ICD-10-CM

## 2017-03-12 NOTE — Therapy (Signed)
[place], [place], [address] Phone: [phone]   Fax:  [phone]  Physical Therapy Treatment  Patient Details  Name: Lynn Gonzalez MRN: [id number] Date of Birth: [birth year] Referring Provider: Ruthy Dick PA-C  Encounter Date: 03/12/2017      PT End of Session - 03/12/17 0842    Visit Number 7   Number of Visits 13   Date for PT Re-Evaluation 03/24/17   Authorization Type MCR: Kx mod by 15th, progress note by 10th or 7/26    PT Start Time 0838   PT Stop Time 0920   PT Time Calculation (min) 42 min      Past Medical History:  Diagnosis Date  . ANEMIA   . Anxiety   . Asthma   . B12 DEFICIENCY   . Bipolar affective disorder ([place]) 1985   ?nervous breakdown  . BREAST CYST, LEFT   . Breast tumor   . GASTROESOPHAGEAL REFLUX DISEASE   . GOITER   . H/O cardiovascular stress test    Lex MV 4/14:  No ischemia, EF 72%, normal wall motion  . Hyperthyroidism   . INTERSTITIAL LUNG DISEASE   . Syncope   . Thyroid disease    ? overactive thyroid    Past Surgical History:  Procedure Laterality Date  . ABDOMINAL HYSTERECTOMY  06/03/10  . BUNIONECTOMY     bilateral  . GALLBLADDER SURGERY  1985   stones removed  . KNEE SURGERY  1987   bilateral  . LUMBAR LAMINECTOMY/DECOMPRESSION MICRODISCECTOMY Right 01/13/2013   Procedure: MICRODISCECTOMY FORAMINOTOMY OF L4-5 ON THE RIGHT;  Surgeon: Magnus Sinning, MD;  Location: [facility];  Service: Orthopedics;  Laterality: Right;  . MEDIASTINOSCOPY  2001  . STERNOTOMY  2001  . TONSILLECTOMY    . [place]    Vitals:   03/12/17 0919  BP: 118/84        Subjective Assessment - 03/12/17 0844    Subjective Hip is doing alright. I cleaned up last night without pain.    Currently in Pain? No/denies                         [facility] Adult PT Treatment/Exercise - 03/12/17 0001      Lumbar Exercises: Stretches   ITB  Stretch 1 rep;60 seconds     Knee/Hip Exercises: Stretches   Hip Flexor Stretch Both;1 rep;60 seconds   Piriformis Stretch Limitations figure 4 push and pull 3 x 30 sec each Left and right     Knee/Hip Exercises: Aerobic   Nustep L5 x 5 minutes      Knee/Hip Exercises: Supine   Bridges 10 reps;2 sets   Straight Leg Raises 10 reps;Both   Straight Leg Raises Limitations with abdominal draw in    Other Supine Knee/Hip Exercises supine clam green band x 10      Knee/Hip Exercises: Sidelying   Hip ABduction 10 reps;Both     Knee/Hip Exercises: Prone   Hip Extension 10 reps;Both     Moist Heat Therapy   Number Minutes Moist Heat 15 Minutes   Moist Heat Location Hip                  PT Short Term Goals - 03/05/17 9470      PT SHORT TERM GOAL #1   Title pt to be I with inital HEP (03/03/2017)   Time 3   Period  Weeks   Status Achieved     PT SHORT TERM GOAL #2   Title pt to verbalize and demo techniques to reduce pain and inflammation via RICE and HEP (03/03/2017)   Time 3   Period Weeks   Status Achieved     PT SHORT TERM GOAL #3   Title pt will demonstrate decreased vastus lateralis / glute med tightness to reduce pain to </= 5/10 in the hip and knee (03/03/2017)   Time 3   Period Weeks   Status Achieved           PT Long Term Goals - 03/10/17 1047      PT LONG TERM GOAL #1   Title increase knee flexion to >/= 120 and extension 0 degreesand increase hip flexion/ extension by >/= 8 degrees with </= 1/10 pain for functional and efficient gait pattern (03/24/2017)   Baseline -5 knee extension   Time 6   Period Weeks   Status Partially Met     PT LONG TERM GOAL #2   Title pt will increase over LLE strength to >/= 4/5 with </= 2/10 pain for to provide stability for the hip/ knee with standing/ walking activities (03/24/2017)   Baseline 3+ to 4/5    Time 6   Period Weeks   Status Partially Met     PT LONG TERM GOAL #3   Title pt will be able to sit/ walk or  stand for >/= 45 min with </= 2/10 pain for functional endurance required for community ambulation and ADLs (03/24/2017)   Baseline pain with inclines, otherwise no pain with flat surfaces    Time 6   Period Weeks   Status Partially Met     PT LONG TERM GOAL #4   Title increase FOTO score to </= 40% limited to demo improvment in function (03/24/2017)   Status Unable to assess     PT LONG TERM GOAL #5   Title pt to be I with all HEP given as of last visit (03/24/2017)   Status On-going               Plan - 03/12/17 0857    Clinical Impression Statement Pt reports decreased hip pain noted yesterday while cleaning. She would like to finish next 2 appointments and be discharged. Added 3 way hip to HEP today. Will focus on comprehensive HEP over next 2 visits so patietnt will be ready for discharge.    PT Next Visit Plan FOTO, How was MD appt? check BP, CHECK GOALS, finalize HEP (wants to disharge 8/2) ,  manual over vastus lateralis/ glute medius, hip/knee strengthening, modalities PRN, VMO facilitation techniques, hip mobs   PT Home Exercise Plan SAQ with ball squeeze, ITB stretch, supine marching, hip flexor stretch, figure 4 push and pull,  3 way hip    Consulted and Agree with Plan of Care Patient      Patient will benefit from skilled therapeutic intervention in order to improve the following deficits and impairments:  Abnormal gait, Pain, Improper body mechanics, Postural dysfunction, Decreased endurance, Decreased activity tolerance, Decreased balance, Increased fascial restricitons, Decreased strength  Visit Diagnosis: Chronic pain of left knee  Other abnormalities of gait and mobility  Other muscle spasm  Muscle weakness (generalized)  Pain in left hip     Problem List Patient Active Problem List   Diagnosis Date Noted  . Paresthesias 06/25/2016  . Headache 06/25/2016  . Hypokalemia 06/25/2016  . Prolonged Q-T interval on ECG  06/25/2016  . Thumb numbness  10/28/2013  . Hypertension 10/24/2013  . Hip pain 06/21/2013  . Low back pain radiating to right leg 05/04/2013  . Elevated blood pressure 10/12/2012  . Snoring 04/07/2012  . GERD (gastroesophageal reflux disease) 01/22/2012  . Gout 03/12/2011  . GOITER 10/21/2010  . BREAST CYST, LEFT 07/23/2010  . B12 DEFICIENCY 03/08/2010  . Bipolar 1 disorder ([place]) 03/08/2010  . HYPERTHYROIDISM 02/11/2010  . ANEMIA 02/11/2010  . INTERSTITIAL LUNG DISEASE 02/11/2010  . GASTROESOPHAGEAL REFLUX DISEASE 02/11/2010    Dorene Ar, PTA 03/12/2017, 9:20 AM  [facility] [address] [place], [place], [address] Phone: [phone]   Fax:  [phone]  Name: Lynn Gonzalez MRN: [id number] Date of Birth: [birth year]

## 2017-03-12 NOTE — Patient Instructions (Signed)
Hip Extension (Prone)   Lift left leg  from floor, keeping knee locked. Repeat _10___ times per set. Do __2__ sets per session. Do __2__ sessions per day.   Abduction: Side Leg Lift (Eccentric) - Side-Lying   Lie on side. Lift top leg slightly higher than shoulder level. Keep top leg straight with body, toes pointing forward. Slowly lower for 3-5 seconds. _10__ reps per set, __2_ sets per day, __7_ days per week.   Copyright  VHI. All rights reserved.  Strengthening: Straight Leg Raise (Phase 1)   Tighten muscles on front of right thigh, then lift leg from surface, keeping knee locked.  Repeat __10__ times per set. Do _2___ sets per session. Do _2___ sessions per day.  http://orth.exer.us/614   Copyright  VHI. All rights reserved.

## 2017-03-16 ENCOUNTER — Ambulatory Visit: Payer: Medicare Other | Admitting: Physical Therapy

## 2017-03-16 ENCOUNTER — Other Ambulatory Visit: Payer: Self-pay | Admitting: Gastroenterology

## 2017-03-16 DIAGNOSIS — R112 Nausea with vomiting, unspecified: Secondary | ICD-10-CM

## 2017-03-16 DIAGNOSIS — R634 Abnormal weight loss: Secondary | ICD-10-CM

## 2017-03-16 DIAGNOSIS — R1084 Generalized abdominal pain: Secondary | ICD-10-CM

## 2017-03-19 ENCOUNTER — Ambulatory Visit: Payer: Medicare Other | Attending: Specialist | Admitting: Physical Therapy

## 2017-03-19 ENCOUNTER — Ambulatory Visit
Admission: RE | Admit: 2017-03-19 | Discharge: 2017-03-19 | Disposition: A | Discharge status: Home / Self Care | Payer: Medicare Other | Source: Ambulatory Visit | Attending: Gastroenterology | Admitting: Gastroenterology

## 2017-03-19 DIAGNOSIS — M25552 Pain in left hip: Secondary | ICD-10-CM | POA: Diagnosis present

## 2017-03-19 DIAGNOSIS — M25562 Pain in left knee: Secondary | ICD-10-CM | POA: Insufficient documentation

## 2017-03-19 DIAGNOSIS — M62838 Other muscle spasm: Secondary | ICD-10-CM

## 2017-03-19 DIAGNOSIS — R634 Abnormal weight loss: Secondary | ICD-10-CM

## 2017-03-19 DIAGNOSIS — R1084 Generalized abdominal pain: Secondary | ICD-10-CM

## 2017-03-19 DIAGNOSIS — R2689 Other abnormalities of gait and mobility: Secondary | ICD-10-CM | POA: Diagnosis present

## 2017-03-19 DIAGNOSIS — M6281 Muscle weakness (generalized): Secondary | ICD-10-CM

## 2017-03-19 DIAGNOSIS — G8929 Other chronic pain: Secondary | ICD-10-CM | POA: Diagnosis present

## 2017-03-19 DIAGNOSIS — R112 Nausea with vomiting, unspecified: Secondary | ICD-10-CM

## 2017-03-19 MED ORDER — IOPAMIDOL (ISOVUE-300) INJECTION 61%
100.0000 mL | Freq: Once | INTRAVENOUS | Status: AC | PRN
  Administered 2017-03-19: 100 mL via INTRAVENOUS

## 2017-03-19 NOTE — Patient Instructions (Signed)
Pelvic Tilt    Flatten back by tightening stomach muscles and buttocks. Repeat ___20_ times per set. Do __2__ sets per session. Do __2__ sessions per day. CAN ALSO SQUEEZE BALL BETWEEN KNEES    Bridge    Lie back, legs bent. Inhale, pressing hips up. Keeping ribs in, lengthen lower back. Exhale, rolling down along spine from top. Repeat 10____ times. Do __2__ sessions per day.  http://pm.exer.us/55   Copyright  VHI. All rights reserved.

## 2017-03-19 NOTE — Therapy (Addendum)
[place], [place], [address] Phone: [phone]   Fax:  [phone]  Physical Therapy Treatment / Discharge Summary  Patient Details  Name: Lynn Gonzalez MRN: [id number] Date of Birth: [birth year] Referring Provider: Ruthy Dick PA-C  Encounter Date: 03/19/2017      PT End of Session - 03/19/17 0858    Visit Number 8   Number of Visits 13   Date for PT Re-Evaluation 03/24/17   Authorization Type MCR: Kx mod by 15th, progress note by 10th or 7/26    PT Start Time 0847   PT Stop Time 0945   PT Time Calculation (min) 58 min      Past Medical History:  Diagnosis Date  . ANEMIA   . Anxiety   . Asthma   . B12 DEFICIENCY   . Bipolar affective disorder ([place]) 1985   ?nervous breakdown  . BREAST CYST, LEFT   . Breast tumor   . GASTROESOPHAGEAL REFLUX DISEASE   . GOITER   . H/O cardiovascular stress test    Lex MV 4/14:  No ischemia, EF 72%, normal wall motion  . Hyperthyroidism   . INTERSTITIAL LUNG DISEASE   . Syncope   . Thyroid disease    ? overactive thyroid    Past Surgical History:  Procedure Laterality Date  . ABDOMINAL HYSTERECTOMY  06/03/10  . BUNIONECTOMY     bilateral  . GALLBLADDER SURGERY  1985   stones removed  . KNEE SURGERY  1987   bilateral  . LUMBAR LAMINECTOMY/DECOMPRESSION MICRODISCECTOMY Right 01/13/2013   Procedure: MICRODISCECTOMY FORAMINOTOMY OF L4-5 ON THE RIGHT;  Surgeon: Magnus Sinning, MD;  Location: [facility];  Service: Orthopedics;  Laterality: Right;  . MEDIASTINOSCOPY  2001  . STERNOTOMY  2001  . TONSILLECTOMY    . [place]    There were no vitals filed for this visit.      Subjective Assessment - 03/19/17 0853    Subjective left hip does not hurt. The right hip is hurting more now.    Currently in Pain? Yes   Pain Score 7    Pain Location Hip   Pain Orientation Right   Aggravating Factors  walking, laying on right side              [facility] PT Assessment - 03/19/17 0001      Observation/Other Assessments   Focus on Therapeutic Outcomes (FOTO)  48% limited   improved from 63% limited      AROM   Right Hip Extension 10   Right Hip Flexion 105   Left Hip Extension 10   Left Hip Flexion 105   Left Knee Extension -4   Left Knee Flexion 125     Strength   Right Hip Extension 3+/5   Left Hip Flexion 4/5   Left Hip Extension 3+/5   Left Hip ABduction 4/5   Right Knee Flexion 4+/5   Right Knee Extension 4+/5   Left Knee Flexion 4+/5   Left Knee Extension 4+/5                     [facility] Adult PT Treatment/Exercise - 03/19/17 0001      Lumbar Exercises: Stretches   Single Knee to Chest Stretch 10 seconds;3 reps   ITB Stretch 1 rep;60 seconds     Knee/Hip Exercises: Stretches   Active Hamstring Stretch 2 reps;30 seconds   Hip Flexor Stretch Both;1  rep;60 seconds   Piriformis Stretch Limitations figure 4 push and pull 3 x 30 sec each Left and right     Knee/Hip Exercises: Aerobic   Nustep L6 x 6 minutes      Knee/Hip Exercises: Supine   Bridges 10 reps;2 sets   Straight Leg Raises 10 reps;Both   Straight Leg Raises Limitations with abdominal draw in    Other Supine Knee/Hip Exercises supine clam green band x 10 , pelvic tilit x 10    Other Supine Knee/Hip Exercises supine marching with abdominal bracing.      Knee/Hip Exercises: Sidelying   Hip ABduction 10 reps;Both     Knee/Hip Exercises: Prone   Hip Extension 10 reps;Both     Moist Heat Therapy   Number Minutes Moist Heat 15 Minutes   Moist Heat Location Hip                PT Education - 03/19/17 0911    Education provided Yes   Education Details HEP   Person(s) Educated Patient   Methods Explanation;Handout   Comprehension Verbalized understanding          PT Short Term Goals - 03/05/17 0853      PT SHORT TERM GOAL #1   Title pt to be I with inital HEP (03/03/2017)   Time 3   Period Weeks   Status  Achieved     PT SHORT TERM GOAL #2   Title pt to verbalize and demo techniques to reduce pain and inflammation via RICE and HEP (03/03/2017)   Time 3   Period Weeks   Status Achieved     PT SHORT TERM GOAL #3   Title pt will demonstrate decreased vastus lateralis / glute med tightness to reduce pain to </= 5/10 in the hip and knee (03/03/2017)   Time 3   Period Weeks   Status Achieved           PT Long Term Goals - 03/19/17 0950      PT LONG TERM GOAL #1   Title increase knee flexion to >/= 120 and extension 0 degreesand increase hip flexion/ extension by >/= 8 degrees with </= 1/10 pain for functional and efficient gait pattern (03/24/2017)   Baseline -5 knee extension   Time 6   Period Weeks   Status Partially Met     PT LONG TERM GOAL #2   Title pt will increase over LLE strength to >/= 4/5 with </= 2/10 pain for to provide stability for the hip/ knee with standing/ walking activities (03/24/2017)   Baseline 3+ HIP EXTENSION   Time 6   Period Weeks   Status Partially Met     PT LONG TERM GOAL #3   Title pt will be able to sit/ walk or stand for >/= 45 min with </= 2/10 pain for functional endurance required for community ambulation and ADLs (03/24/2017)   Baseline pain with inclines, otherwise no pain with flat surfaces left hip/knee , right hip is now painful    Time 6   Period Weeks   Status Partially Met     PT LONG TERM GOAL #4   Title increase FOTO score to </= 40% limited to demo improvment in function (03/24/2017)   Baseline 48% limited    Time 6   Period Weeks   Status Partially Met     PT LONG TERM GOAL #5   Title pt to be I with all HEP given as of last visit (  03/24/2017)   Time 6   Period Weeks   Status Achieved             G-Codes - 04-12-17 1158    Functional Assessment Tool Used (Outpatient Only) FOTO/ ROM and strength   Functional Limitation Mobility: Walking and moving around   Mobility: Walking and Moving Around Goal Status [phone]) At least 20  percent but less than 40 percent impaired, limited or restricted   Mobility: Walking and Moving Around Discharge Status [phone]) At least 20 percent but less than 40 percent impaired, limited or restricted             Plan - 04-12-17 0911    Clinical Impression Statement Pt has partially met most goals. Her FOTO score has improved. Her hip/ knee ROM have improved. She still has some hip weakness but would like to continue her HEP and dicharge today. Her left hip and knee pain have mostly resolved. She now c/ o right hip pain limiting walking and sleep.    PT Next Visit Plan discharge today   PT Home Exercise Plan SAQ with ball squeeze, ITB stretch, supine marching, hip flexor stretch, figure 4 push and pull,  3 way hip , bridge, pelvic tilit    Consulted and Agree with Plan of Care Patient      Patient will benefit from skilled therapeutic intervention in order to improve the following deficits and impairments:  Abnormal gait, Pain, Improper body mechanics, Postural dysfunction, Decreased endurance, Decreased activity tolerance, Decreased balance, Increased fascial restricitons, Decreased strength  Visit Diagnosis: Chronic pain of left knee  Other abnormalities of gait and mobility  Other muscle spasm  Muscle weakness (generalized)  Pain in left hip     Problem List Patient Active Problem List   Diagnosis Date Noted  . Paresthesias 06/25/2016  . Headache 06/25/2016  . Hypokalemia 06/25/2016  . Prolonged Q-T interval on ECG 06/25/2016  . Thumb numbness 10/28/2013  . Hypertension 10/24/2013  . Hip pain 06/21/2013  . Low back pain radiating to right leg 05/04/2013  . Elevated blood pressure 10/12/2012  . Snoring 04/07/2012  . GERD (gastroesophageal reflux disease) 01/22/2012  . Gout 03/12/2011  . GOITER 10/21/2010  . BREAST CYST, LEFT 07/23/2010  . B12 DEFICIENCY 03/08/2010  . Bipolar 1 disorder ([place]) 03/08/2010  . HYPERTHYROIDISM 02/11/2010  . ANEMIA 02/11/2010  .  INTERSTITIAL LUNG DISEASE 02/11/2010  . GASTROESOPHAGEAL REFLUX DISEASE 02/11/2010    Dorene Ar, PTA 04/12/2017, 10:44 AM  [facility] [address] [place], [place], [address] Phone: [phone]   Fax:  [phone]  Name: Lynn Gonzalez MRN: [id number] Date of Birth: Jan 12, 1961      PHYSICAL THERAPY DISCHARGE SUMMARY  Visits from Start of Care: 8  Current functional level related to goals / functional outcomes: See goals, FOTO 48% limited   Remaining deficits: Mild weakness noted in the L/R hip extensors,    Education / Equipment: HEP, theraband, posture   Plan: Patient agrees to discharge.  Patient goals were partially met. Patient is being discharged due to being pleased with the current functional level.  ?????     Kristoffer Leamon PT, DPT, LAT, ATC  2017/04/12  12:01 PM

## 2017-04-15 ENCOUNTER — Emergency Department (HOSPITAL_COMMUNITY)
Admission: EM | Admit: 2017-04-15 | Discharge: 2017-04-15 | Disposition: A | Discharge status: Home / Self Care | Payer: Medicare Other | Attending: Emergency Medicine | Admitting: Emergency Medicine

## 2017-04-15 ENCOUNTER — Emergency Department (HOSPITAL_COMMUNITY): Payer: Medicare Other

## 2017-04-15 ENCOUNTER — Encounter (HOSPITAL_COMMUNITY): Payer: Self-pay | Admitting: *Deleted

## 2017-04-15 DIAGNOSIS — I10 Essential (primary) hypertension: Secondary | ICD-10-CM | POA: Insufficient documentation

## 2017-04-15 DIAGNOSIS — W19XXXA Unspecified fall, initial encounter: Secondary | ICD-10-CM

## 2017-04-15 DIAGNOSIS — Y9302 Activity, running: Secondary | ICD-10-CM | POA: Diagnosis not present

## 2017-04-15 DIAGNOSIS — W01198A Fall on same level from slipping, tripping and stumbling with subsequent striking against other object, initial encounter: Secondary | ICD-10-CM | POA: Diagnosis not present

## 2017-04-15 DIAGNOSIS — Z79899 Other long term (current) drug therapy: Secondary | ICD-10-CM | POA: Insufficient documentation

## 2017-04-15 DIAGNOSIS — M79641 Pain in right hand: Secondary | ICD-10-CM

## 2017-04-15 DIAGNOSIS — J45909 Unspecified asthma, uncomplicated: Secondary | ICD-10-CM | POA: Diagnosis not present

## 2017-04-15 DIAGNOSIS — M25561 Pain in right knee: Secondary | ICD-10-CM | POA: Insufficient documentation

## 2017-04-15 DIAGNOSIS — Y92007 Garden or yard of unspecified non-institutional (private) residence as the place of occurrence of the external cause: Secondary | ICD-10-CM | POA: Diagnosis not present

## 2017-04-15 NOTE — ED Notes (Signed)
Pt called for triage with no response. 

## 2017-04-15 NOTE — Discharge Instructions (Signed)
You may apply ice for 15-20 minutes up to 3-4 times a day for any areas that are sore. You may also take Tylenol or ibuprofen as needed for pain and inflammation control.  If you develop new or worsening symptoms, including the worst headache of your life, dizziness, or vomiting, please return to the emergency department for reevaluation.

## 2017-04-15 NOTE — ED Provider Notes (Signed)
[facility] DEPT Provider Note   CSN: [id number] Arrival date & time: 04/15/17  1550     History   Chief Complaint Chief Complaint  Patient presents with  . Fall    HPI Lynn Gonzalez is a 56 y.o. female presents to the emergency department with a chief complaint of fall. She reports that she was in her storage unit in her backyard when she heard someone whistling and states that she saw a black dog run through her backyard outside of the storage unit. She reports the dog was unknown to her and scared her so she ran outside of the storage unit through her backyard towards her house. She reports that she was wearing her slippers, tripped, and fell forward hitting her right knee, fingers on her right hand, and her forehead. She denies LOC, HA, weakness, numbness, nausea, or emesis. She reports that she was able to stand and ambulate within seconds after the fall. She denies lightheadedness or dizziness. She is not on blood thinners. No treatment prior to arrival.  The history is provided by the patient. No language interpreter was used.    Past Medical History:  Diagnosis Date  . ANEMIA   . Anxiety   . Asthma   . B12 DEFICIENCY   . Bipolar affective disorder ([place]) 1985   ?nervous breakdown  . BREAST CYST, LEFT   . Breast tumor   . GASTROESOPHAGEAL REFLUX DISEASE   . GOITER   . H/O cardiovascular stress test    Lex MV 4/14:  No ischemia, EF 72%, normal wall motion  . Hyperthyroidism   . INTERSTITIAL LUNG DISEASE   . Syncope   . Thyroid disease    ? overactive thyroid    Patient Active Problem List   Diagnosis Date Noted  . Paresthesias 06/25/2016  . Headache 06/25/2016  . Hypokalemia 06/25/2016  . Prolonged Q-T interval on ECG 06/25/2016  . Thumb numbness 10/28/2013  . Hypertension 10/24/2013  . Hip pain 06/21/2013  . Low back pain radiating to right leg 05/04/2013  . Elevated blood pressure 10/12/2012  . Snoring 04/07/2012  . GERD (gastroesophageal reflux  disease) 01/22/2012  . Gout 03/12/2011  . GOITER 10/21/2010  . BREAST CYST, LEFT 07/23/2010  . B12 DEFICIENCY 03/08/2010  . Bipolar 1 disorder ([place]) 03/08/2010  . HYPERTHYROIDISM 02/11/2010  . ANEMIA 02/11/2010  . INTERSTITIAL LUNG DISEASE 02/11/2010  . GASTROESOPHAGEAL REFLUX DISEASE 02/11/2010    Past Surgical History:  Procedure Laterality Date  . ABDOMINAL HYSTERECTOMY  06/03/10  . BUNIONECTOMY     bilateral  . GALLBLADDER SURGERY  1985   stones removed  . KNEE SURGERY  1987   bilateral  . LUMBAR LAMINECTOMY/DECOMPRESSION MICRODISCECTOMY Right 01/13/2013   Procedure: MICRODISCECTOMY FORAMINOTOMY OF L4-5 ON THE RIGHT;  Surgeon: Magnus Sinning, MD;  Location: [facility];  Service: Orthopedics;  Laterality: Right;  . MEDIASTINOSCOPY  2001  . STERNOTOMY  2001  . TONSILLECTOMY    . [place]    OB History    No data available       Home Medications    Prior to Admission medications   Medication Sig Start Date End Date Taking? Authorizing Provider  albuterol (PROVENTIL HFA;VENTOLIN HFA) 108 (90 BASE) MCG/ACT inhaler Inhale 2 puffs into the lungs every 6 (six) hours as needed for wheezing or shortness of breath.     [provider]  amLODipine (NORVASC) 5 MG tablet take 1 tablet by mouth once daily 08/23/14  Debbrah Alar, NP  estradiol (ESTRACE) 2 MG tablet take 1 tablet by mouth once daily 08/23/14   Debbrah Alar, NP  lithium 300 MG tablet Take 300-600 mg by mouth 2 (two) times daily. Pt takes one tablet in the morning and two at bedtime.    [provider]  methylcellulose (CITRUCEL) oral powder Take 1 packet by mouth daily. 10/28/16   Dorie Rank, MD  QUEtiapine (SEROQUEL) 300 MG tablet Take 300 mg by mouth at bedtime.    [provider]    Family History Family History  Problem Relation Age of Onset  . Cancer Father        ?type  . Hypertension Father   . Diabetes Mother   . Hypertension Mother     Social  History Social History  Substance Use Topics  . Smoking status: Never Smoker  . Smokeless tobacco: Never Used  . Alcohol use No     Allergies   Adhesive [tape] and Latex   Review of Systems Review of Systems  Constitutional: Negative for activity change.  Respiratory: Negative for shortness of breath.   Cardiovascular: Negative for chest pain.  Gastrointestinal: Negative for abdominal pain, nausea and vomiting.  Musculoskeletal: Positive for arthralgias and myalgias. Negative for back pain, gait problem and neck pain.  Skin: Negative for rash and wound.  Neurological: Negative for dizziness, weakness, light-headedness, numbness and headaches.     Physical Exam Updated Vital Signs BP (!) 127/92 (BP Location: Left Arm)   Pulse 80   Temp 98.7 F (37.1 C) (Oral)   Resp 18   LMP 04/18/2010   SpO2 98%   Physical Exam  Constitutional: She appears well-developed and well-nourished. No distress.  HENT:  Head: Normocephalic and atraumatic.  Eyes: Conjunctivae and EOM are normal.  Neck: Neck supple.  Cardiovascular: Normal rate, regular rhythm, normal heart sounds and intact distal pulses.  Exam reveals no gallop and no friction rub.   No murmur heard. Pulmonary/Chest: Effort normal and breath sounds normal. No respiratory distress. She has no wheezes. She has no rales.  Abdominal: Soft. She exhibits no distension.  Musculoskeletal: She exhibits no tenderness.  5 out of 5 strength of bilateral upper and lower extremities. Sensation is intact throughout. Full range of motion of the right knee and all digits of the right hand.   Neurological: She is alert.  Ambulatory without difficulty.  Skin: Skin is warm. No rash noted. She is not diaphoretic.  No ecchymosis, abrasion, or wound noted to the forehead.  Psychiatric: Her behavior is normal.  Nursing note and vitals reviewed.    ED Treatments / Results  Labs (all labs ordered are listed, but only abnormal results are  displayed) Labs Reviewed - No data to display  EKG  EKG Interpretation None       Radiology Dg Knee Complete 4 Views Right  Result Date: 04/15/2017 CLINICAL DATA:  Medial knee pain after falling at home today. Initial encounter. EXAM: RIGHT KNEE - COMPLETE 4+ VIEW COMPARISON:  None. FINDINGS: The mineralization and alignment are normal. There is no evidence of acute fracture or dislocation. The joint spaces are maintained, although there is minimal medial and patellofemoral compartment spurring. There is no joint effusion. IMPRESSION: No acute osseous findings.  Minimal degenerative changes. Electronically Signed   By: Richardean Sale M.D.   On: 04/15/2017 16:48   Dg Hand Complete Right  Result Date: 04/15/2017 CLINICAL DATA:  Status post fall outside patient's home today. Patient porch pain in the  second through fourth PIP joint region. EXAM: RIGHT HAND - COMPLETE 3+ VIEW COMPARISON:  Right fifth finger radiographs of April 01, 2012 FINDINGS: The bones of the right hand are subjectively adequately mineralized. There is some irregularity of the cortex of the fifth metacarpal that suggests an old fracture. The joint spaces are well-maintained. Specific attention to the interphalangeal joints of the second through fourth digits reveals no acute abnormality. The soft tissues are unremarkable. IMPRESSION: There is no acute bony abnormality of the right hand. There is deformity of the shaft of the fifth metacarpal consistent with an old fracture. Electronically Signed   By: David  Martinique M.D.   On: 04/15/2017 16:48    Procedures Procedures (including critical care time)  Medications Ordered in ED Medications - No data to display   Initial Impression / Assessment and Plan / ED Course  I have reviewed the triage vital signs and the nursing notes.  Pertinent labs & imaging results that were available during my care of the patient were reviewed by me and considered in my medical decision making  (see chart for details).     56 year old female presenting with a chief complaint of fall. She complains of right knee, right hand, and states that she struck her forehead on concrete. The skin of the forehead is not erythematous, edematous. No ecchymosis. No overlying skin tears or abrasions. No evidence of any recent injury. She reports pain to her right fingers and right knee have resolved at this time. Imaging of the right hand and right knee are unremarkable for acute fracture. The remainder of her physical exam is unremarkable. Will discharge the patient to home with conservative management including anti-inflammatories and ice if she develops symptoms at home. Strict return precautions given. No acute distress. The patient is safe for discharge at this time.  Final Clinical Impressions(s) / ED Diagnoses   Final diagnoses:  Fall, initial encounter  Acute pain of right knee  Right hand pain    New Prescriptions New Prescriptions   No medications on file     Joanne Gavel, PA-C 04/15/17 1731    Milton Ferguson, MD 04/16/17 1258

## 2017-04-15 NOTE — ED Triage Notes (Signed)
Pt reports running from dogs today and tripped and fell. Has pain to right hand and right knee and reports hitting her forehead on cement, possible loc. No acute distress is noted at triage.

## 2017-04-24 ENCOUNTER — Ambulatory Visit: Payer: Self-pay | Admitting: Surgery

## 2017-04-24 NOTE — H&P (Signed)
Lynn Gonzalez 04/24/2017 10:22 AM Location: [facility] Patient #: [id number] DOB: 10/16/1960 Single / Language: Lynn Gonzalez / Race: Black or African American Female  History of Present Illness Lynn Gonzalez A. Viliami Bracco MD; 04/24/2017 10:41 AM) Patient words: Patient sent at the request of Dr. 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 She is had this condition for many years. She's been managed medically for many years with mediocre control symptoms. She does not have any recent flareups of drainage, redness or pain. The location of diseases bilateral axilla. She has never had surgery in the past for this condition. The condition is progressing and not improving with medical management.  The patient is a 56 year old female.   Past Surgical History Lynn Gonzalez, [place]; 04/24/2017 10:22 AM) Breast Biopsy Right. Hysterectomy (not due to cancer) - Complete  Diagnostic Studies History Lynn Gonzalez, [place]; 04/24/2017 10:22 AM) Mammogram 1-3 years ago Pap Smear 1-5 years ago  Allergies Lynn Gonzalez, RMA; 04/24/2017 10:24 AM) Latex Iodinated Contrast Media Adhesive 1"x6yd *MEDICAL DEVICES AND SUPPLIES*  Medication History Lynn Gonzalez, RMA; 04/24/2017 10:25 AM) AmLODIPine Besylate (5MG  Tablet, Oral) Active. Estradiol (2MG  Tablet, Oral) Active. Lithium Carbonate (300MG  Tablet, Oral) Active. QUEtiapine Fumarate (300MG  Tablet, Oral) Active. ProAir HFA (108 (90 Base)MCG/ACT Aerosol Soln, Inhalation) Active. Medications Reconciled  Social History Lynn Gonzalez, [place]; 04/24/2017 10:22 AM) No alcohol use No drug use Tobacco use Never smoker.  Family History Lynn Gonzalez, [place]; 04/24/2017 10:22 AM) Colon Cancer Father. Diabetes Mellitus Mother. Hypertension Father, Mother, Sister. Prostate Cancer Father.  Pregnancy / Birth History Lynn Gonzalez, [place]; 04/24/2017 10:22 AM) Age at menarche 81 years. Age of menopause 34-50 Gravida 3 Maternal age 85-20 Para  2  Other Problems Lynn Gonzalez, [place]; 04/24/2017 10:22 AM) Asthma Back Pain High blood pressure     Review of Systems Lynn Gonzalez RMA; 04/24/2017 10:22 AM) Skin Present- Hives and Rash. Not Present- Change in Wart/Mole, Dryness, Jaundice, New Lesions, Non-Healing Wounds and Ulcer. HEENT Present- Wears glasses/contact lenses. Not Present- Earache, Hearing Loss, Hoarseness, Nose Bleed, Oral Ulcers, Ringing in the Ears, Seasonal Allergies, Sinus Pain, Sore Throat, Visual Disturbances and Yellow Eyes. Breast Not Present- Breast Mass, Breast Pain, Nipple Discharge and Skin Changes. Cardiovascular Not Present- Chest Pain, Difficulty Breathing Lying Down, Leg Cramps, Palpitations, Rapid Heart Rate, Shortness of Breath and Swelling of Extremities. Gastrointestinal Present- Abdominal Pain, Change in Bowel Habits and Chronic diarrhea. Not Present- Bloating, Bloody Stool, Constipation, Difficulty Swallowing, Excessive gas, Gets full quickly at meals, Hemorrhoids, Indigestion, Nausea, Rectal Pain and Vomiting. Musculoskeletal Present- Back Pain. Not Present- Joint Pain, Joint Stiffness, Muscle Pain, Muscle Weakness and Swelling of Extremities. Neurological Not Present- Decreased Memory, Fainting, Headaches, Numbness, Seizures, Tingling, Tremor, Trouble walking and Weakness. Psychiatric Present- Bipolar and Depression. Not Present- Anxiety, Change in Sleep Pattern, Fearful and Frequent crying. Endocrine Not Present- Cold Intolerance, Excessive Hunger, Hair Changes, Heat Intolerance, Hot flashes and New Diabetes. Hematology Not Present- Blood Thinners, Easy Bruising, Excessive bleeding, Gland problems, HIV and Persistent Infections.  Vitals Lynn Gonzalez RMA; 04/24/2017 10:25 AM) 04/24/2017 10:25 AM Weight: 177.8 lb Height: 66in Body Surface Area: 1.9 m Body Mass Index: 28.7 kg/m  Temp.: 98.2F  Pulse: 97 (Regular)  BP: 125/72 (Sitting, Left Arm, Standard)      Physical Exam (Lynn Gonzalez  A. Lynn Demo MD; 04/24/2017 10:42 AM)  General Mental Status-Alert. General Appearance-Consistent with stated age. Hydration-Well hydrated. Voice-Normal.  Integumentary Note: Bilateral July have areas of roughly 8 x 7 cm which is sporadic to both axilla  of hidradenitis without signs of active infection currently. There is mild tenderness to bilateral axilla. No lymphadenopathy noted. No draining wounds or fluctuance noted.  Chest and Lung Exam Chest and lung exam reveals -quiet, even and easy respiratory effort with no use of accessory muscles and on auscultation, normal breath sounds, no adventitious sounds and normal vocal resonance. Inspection Chest Wall - Normal. Back - normal. Note: Scars from previous surgery noted.  Cardiovascular Cardiovascular examination reveals -normal heart sounds, regular rate and rhythm with no murmurs and normal pedal pulses bilaterally.  Neurologic Neurologic evaluation reveals -alert and oriented x 3 with no impairment of recent or remote memory. Mental Status-Normal.  Musculoskeletal Normal Exam - Left-Upper Extremity Strength Normal and Lower Extremity Strength Normal. Normal Exam - Right-Upper Extremity Strength Normal and Lower Extremity Strength Normal.  Lymphatic Axillary  General Axillary Region: Bilateral - Description - Normal. Tenderness - Non Tender.    Assessment & Plan (Lynn Gonzalez A. Lynn Shedden MD; 04/24/2017 10:47 AM)  HIDRADENITIS AXILLARIS (L73.2) Impression: Discussed medical and surgical options for treatment. The risk and benefits of each discussed as well as long-term expectations. She has been managed medically for many years but unfortunately her condition his progress. She will require excision with possible advancement flap closure of both areas. I discussed what happens after surgery as well as long-term expectations and the possibility of contracture formation and loss of function of upper extremity requiring  physical therapy and/or additional surgery to restore that. I discussed wound complications, poor wound healing, use of drains, and the need further treatments and/or other modalities.  Current Plans Pt Education - [facility] Hidradenitis The anatomy and physiology of sweat glands and skin creases was discussed. Pathophysiology of hidradenitis suppurativa was discussed. I stressed good hygiene, avoiding razors and avoiding antiperspirants. Consideration of excision with primary closure versus need to place a drain, open packing, possible skin flaps/grafts was discussed.  Possibility of recurrence was discussed. Risks, benefits, alternatives were discussed. I noted a good likelihood this will help address the problem. Risks of anesthesia and other risks discussed. Questions answered. The patient is considering surgery. They wish to proceed.  HIDRADENITIS SUPPURATIVA OF LEFT AXILLA (L73.2)  HIDRADENITIS SUPPURATIVA OF RIGHT AXILLA (L73.2)

## 2017-05-05 IMAGING — CR DG WRIST COMPLETE 3+V*L*
4 series · 4 of 4 positions shown · non-contrast
Comparison: No priors.

CLINICAL DATA: 54-year-old female with history of trauma after
falling out of a chair complaining of distal left forearm pain.

EXAM:
LEFT WRIST - COMPLETE 3+ VIEW

[wrist pa]
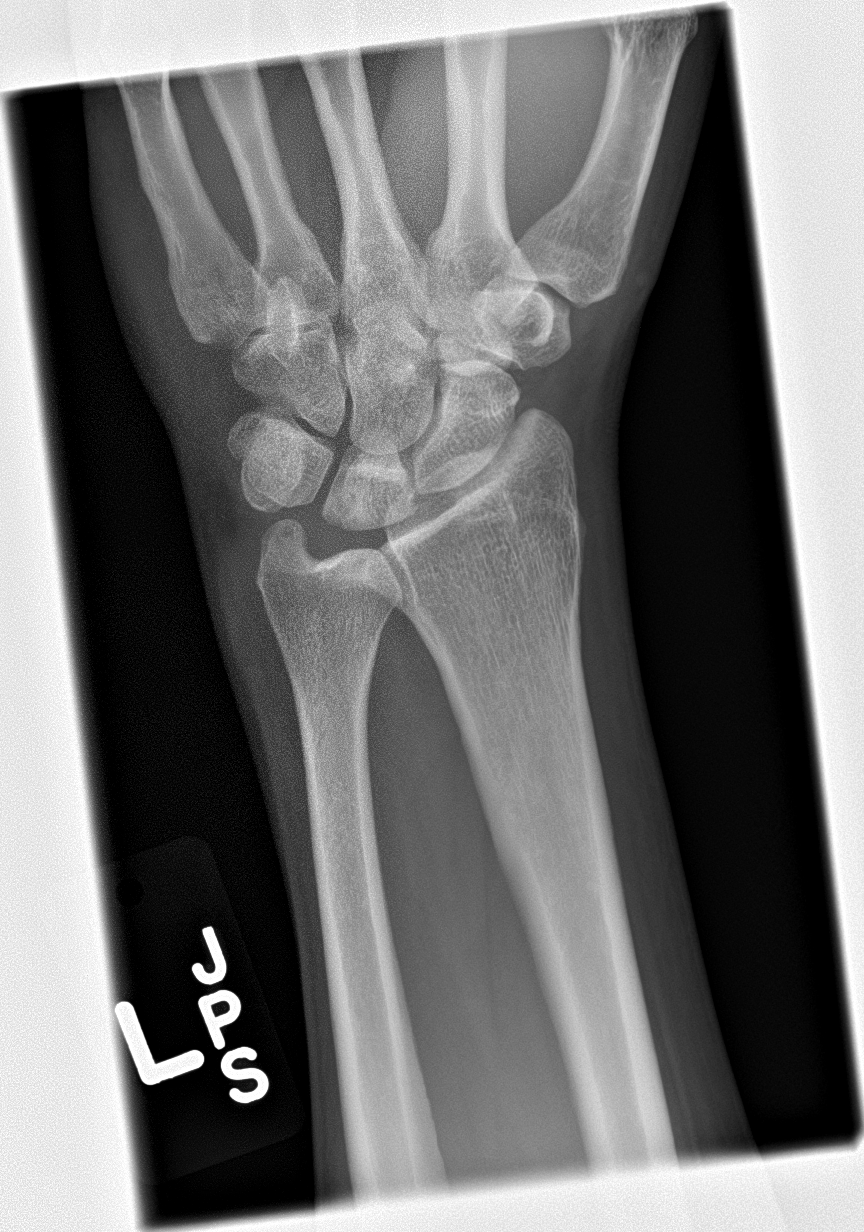

[wrist obl]
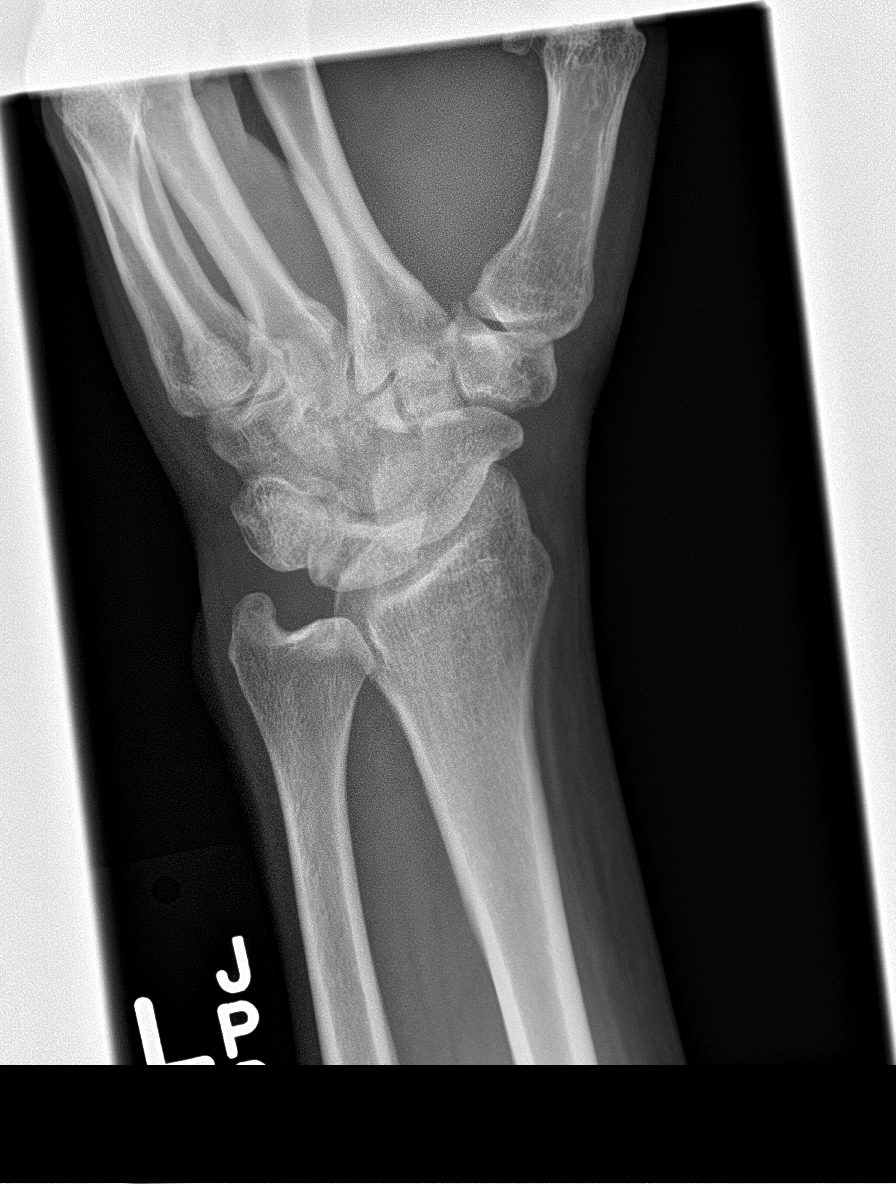

[wrist lat]
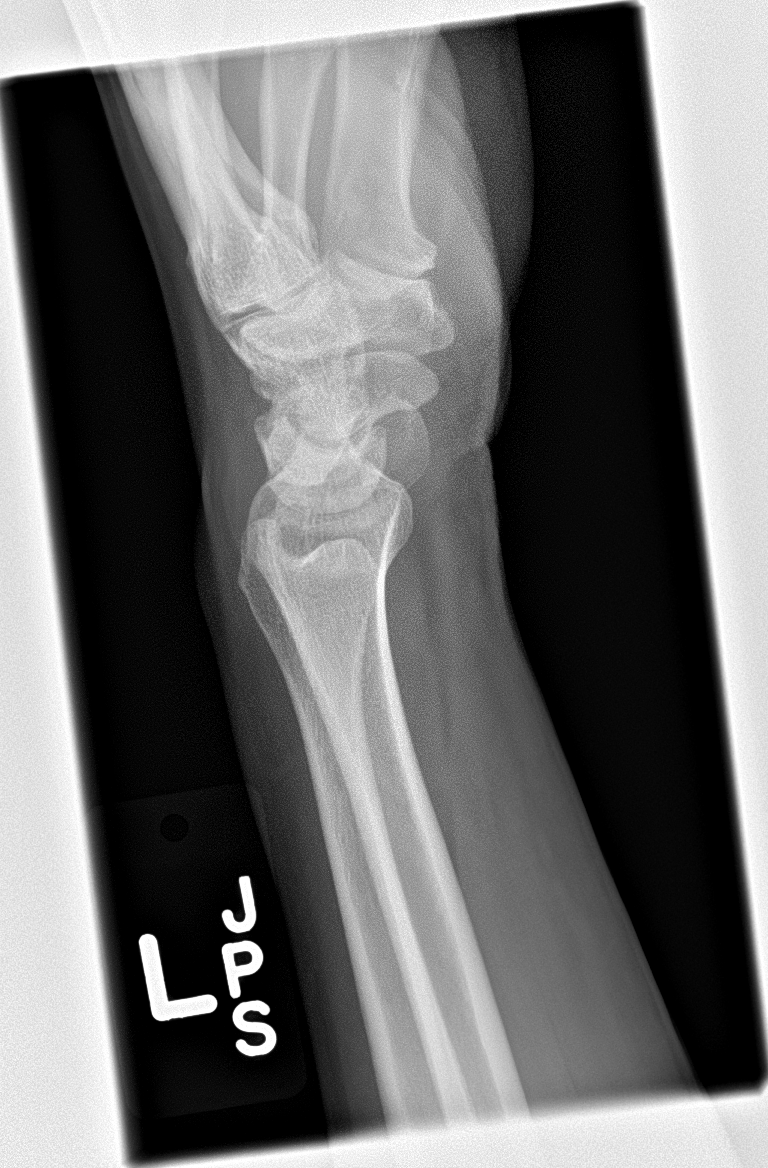

[wrist navicular]
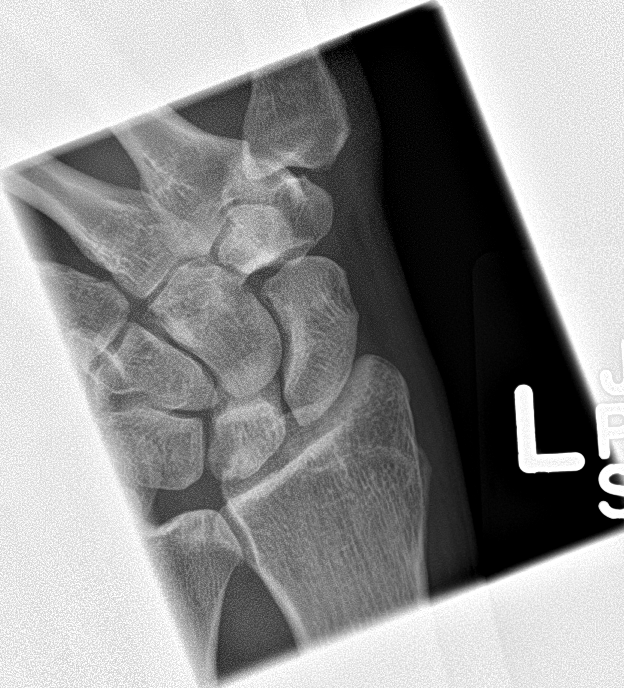

[4 of 4 positions shown; findings below may reference images not displayed]

FINDINGS: Multiple views of the left wrist demonstrate no acute displaced
fracture, subluxation, dislocation, or soft tissue abnormality.
IMPRESSION: No acute radiographic abnormality of the left wrist.

## 2017-05-05 IMAGING — CT CT MAXILLOFACIAL W/O CM
3 series · 15 of 47 positions shown, 18 images · non-contrast
Comparison: 10/15/2013

CLINICAL DATA: Fall this morning, hit left side of the face

EXAM:
CT MAXILLOFACIAL WITHOUT CONTRAST
TECHNIQUE: Multidetector CT imaging of the maxillofacial structures was
performed. Multiplanar CT image reconstructions were also generated.
A small metallic BB was placed on the right temple in order to
reliably differentiate right from left.

[Series 2: facialbone 2.0 st · axial · 0.38mm/px · z∈[-268,-116]mm · 9 of 90 slices shown, 12 images]
[im 7/90  brain]
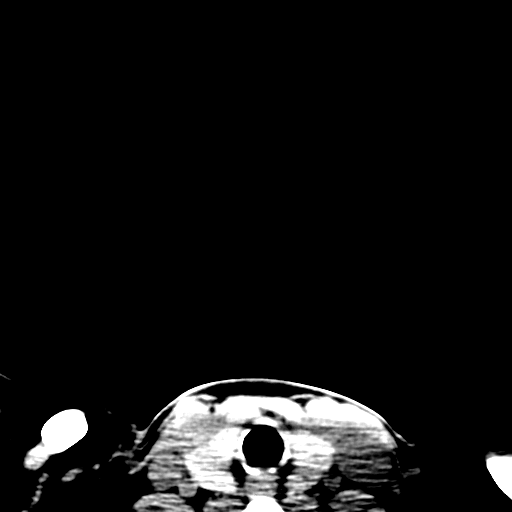
[im 7/90  bone]
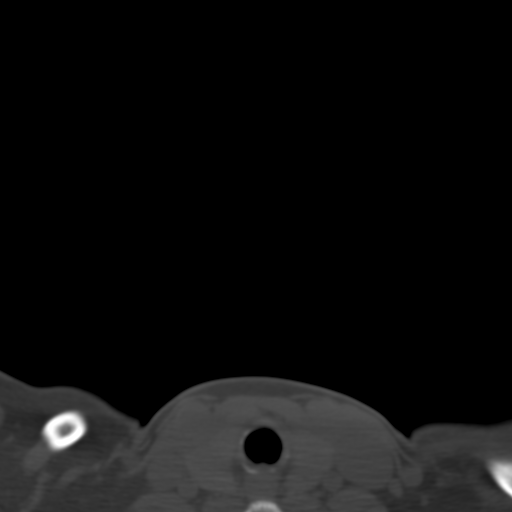
[im 16/90  bone]
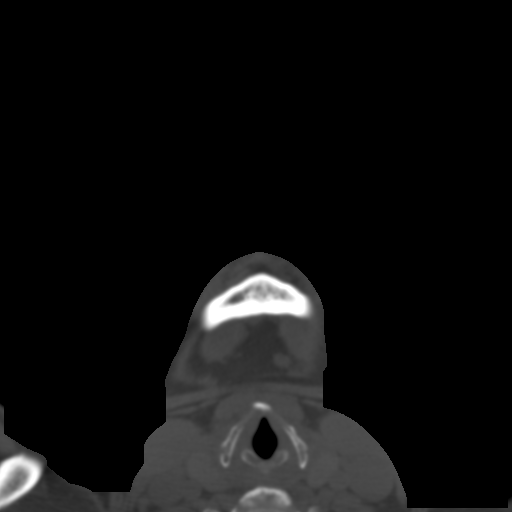
[im 25/90  bone]
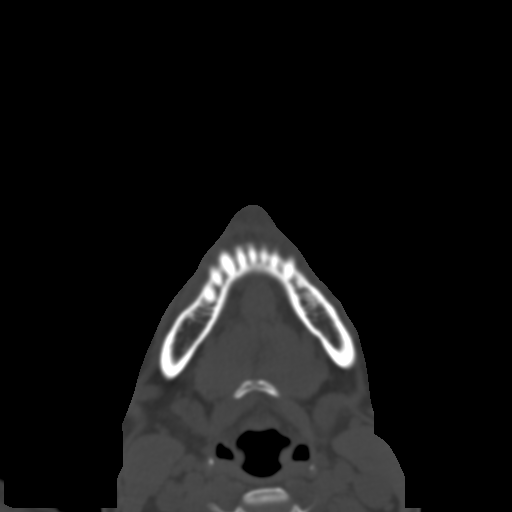
[im 34/90  bone]
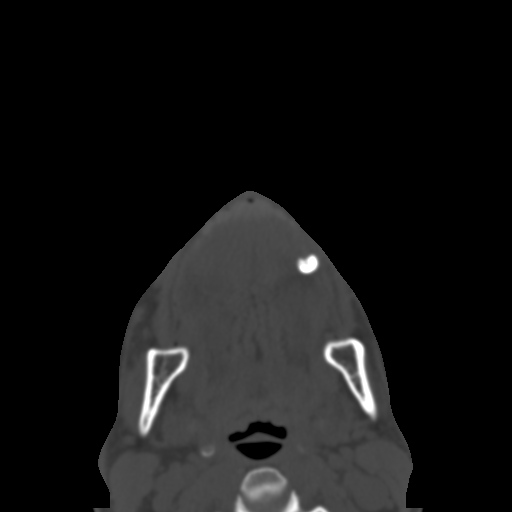
[im 47/90  brain]
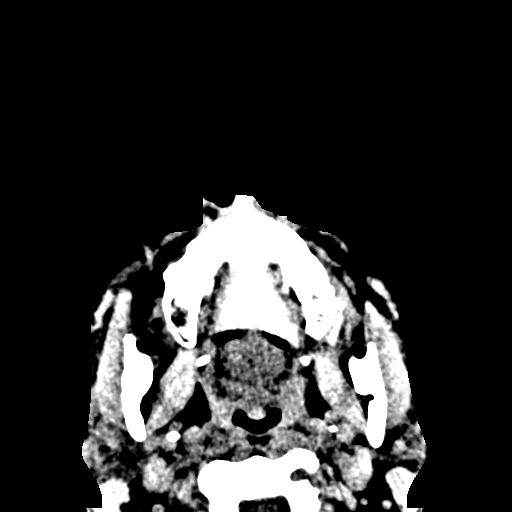
[im 47/90  bone]
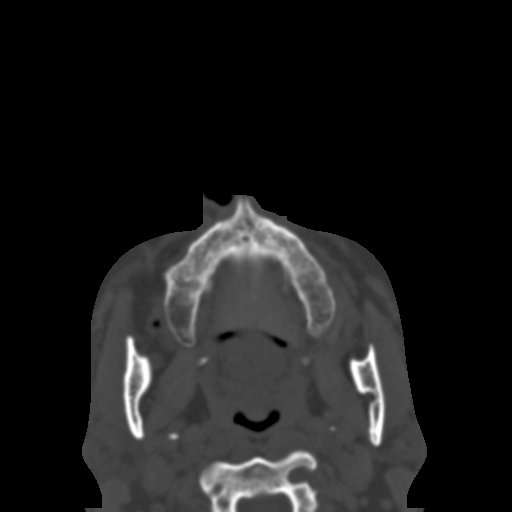
[im 56/90  bone]
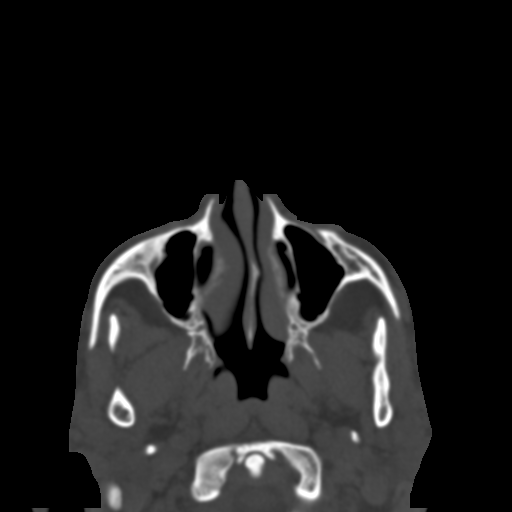
[im 65/90  bone]
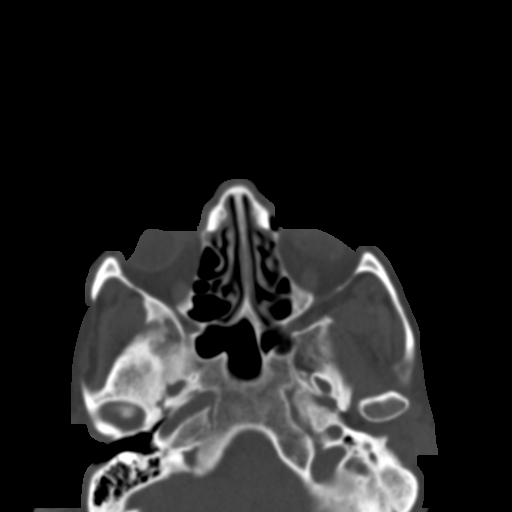
[im 74/90  bone]
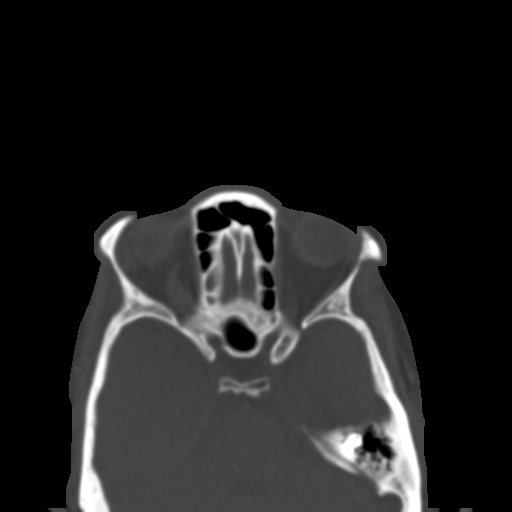
[im 83/90  brain]
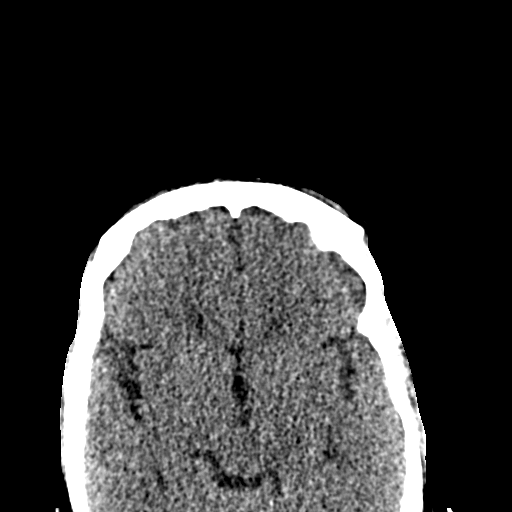
[im 83/90  bone]
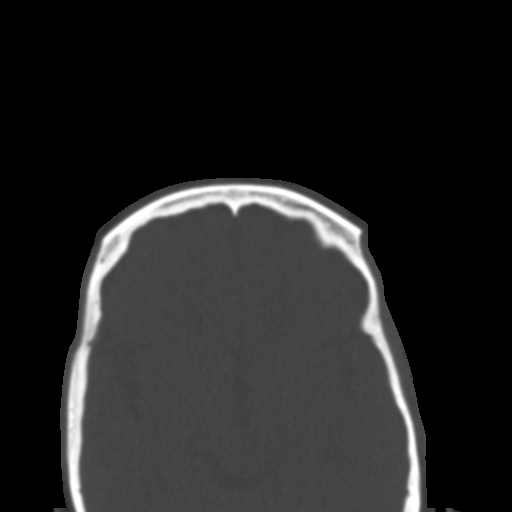

[Series 6: facialbone 2.0 cor st · coronal · 0.35mm/px · 3 of 77 slices shown]
[im 26/77  bone]
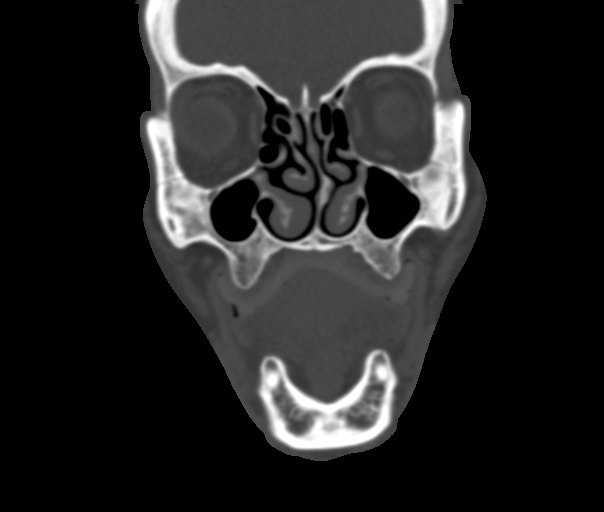
[im 34/77  bone]
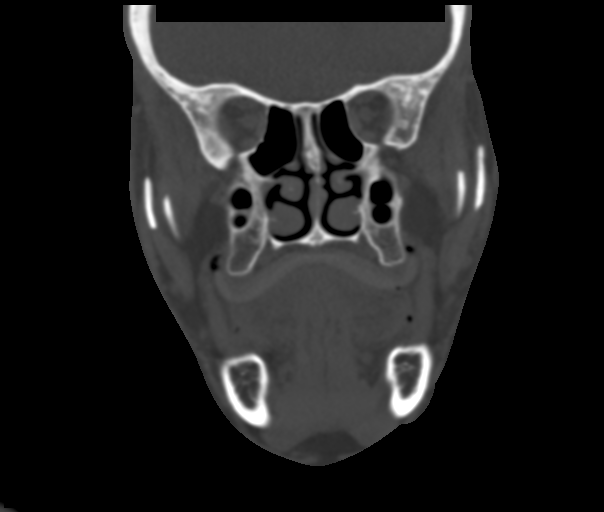
[im 43/77  bone]
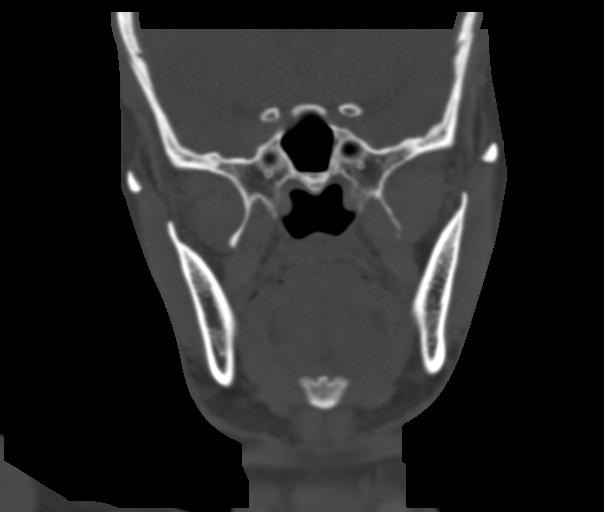

[Series 7: facialbone 2.0 sag st · sagittal · 0.39mm/px · 3 of 73 slices shown]
[im 25/73  bone]
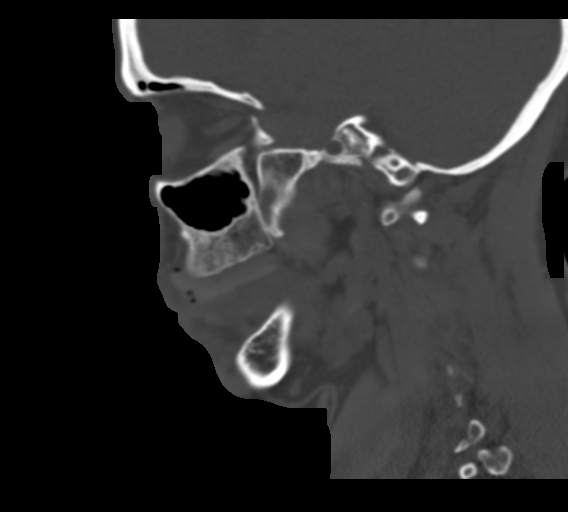
[im 37/73  bone]
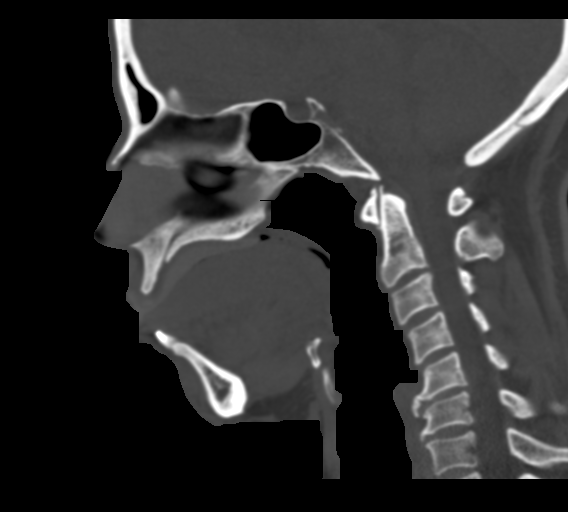
[im 49/73  bone]
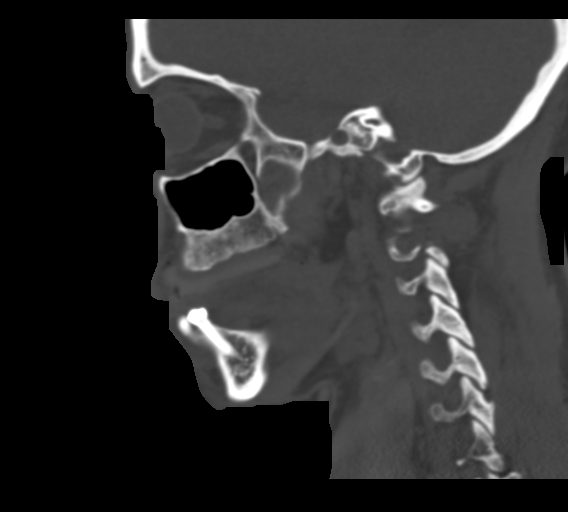

[15 of 47 positions shown; findings below may reference images not displayed]

FINDINGS: Axial images shows no acute fractures. Sagittal images shows no
maxillary spine fracture. The nasopharyngeal and oropharyngeal
airway is patent. Degenerative changes are noted C1-C2 articulation.
Anterior spurring noted lower endplate of C5 and C6 vertebral body.

No paranasal sinuses air-fluid levels. No nasal bone fracture. There
is no zygomatic fracture.

Coronal images shows no orbital floor or orbital rim fracture. There
is left deviation of nasal bony septum. The nasal turbinates are
unremarkable. Bilateral patent semilunar canal.

No TMJ dislocation. No mandibular fracture is noted. Again noted a
edentulous maxilla.

No intraorbital hematoma. Bilateral eye globe is symmetrical in
appearance.
IMPRESSION: 1. No facial fracture facial fluid collection.
2. Patent nasopharyngeal and oropharyngeal airway. No TMJ
dislocation. No mandibular fracture.
3. There is left deviation of nasal bony septum. Bilateral semilunar
canal is patent.

## 2017-05-05 IMAGING — CR DG HIP (WITH OR WITHOUT PELVIS) 2-3V*L*
3 series · 3 of 3 positions shown · non-contrast
Comparison: None.

CLINICAL DATA: Fell from chair.  Left hip pain

EXAM:
DG HIP (WITH OR WITHOUT PELVIS) 2-3V LEFT

[hip ap]
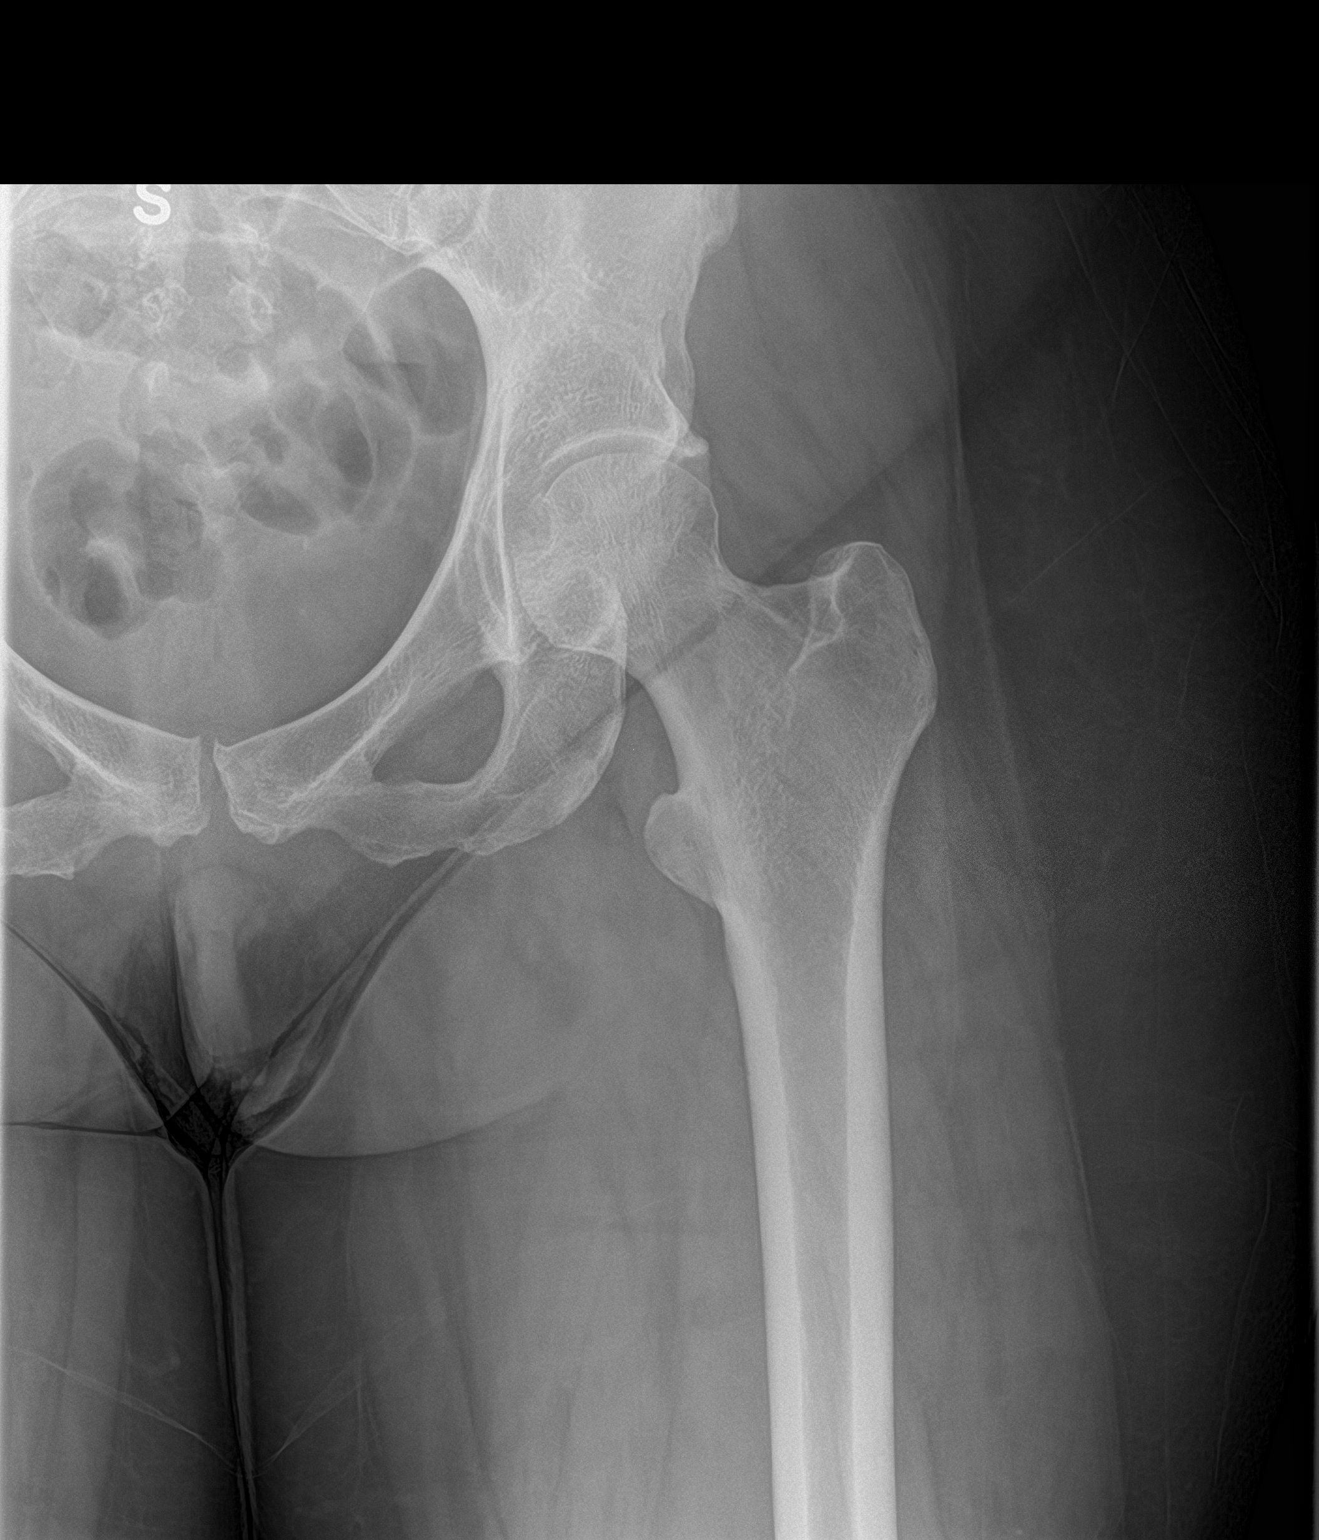

[hip lat]
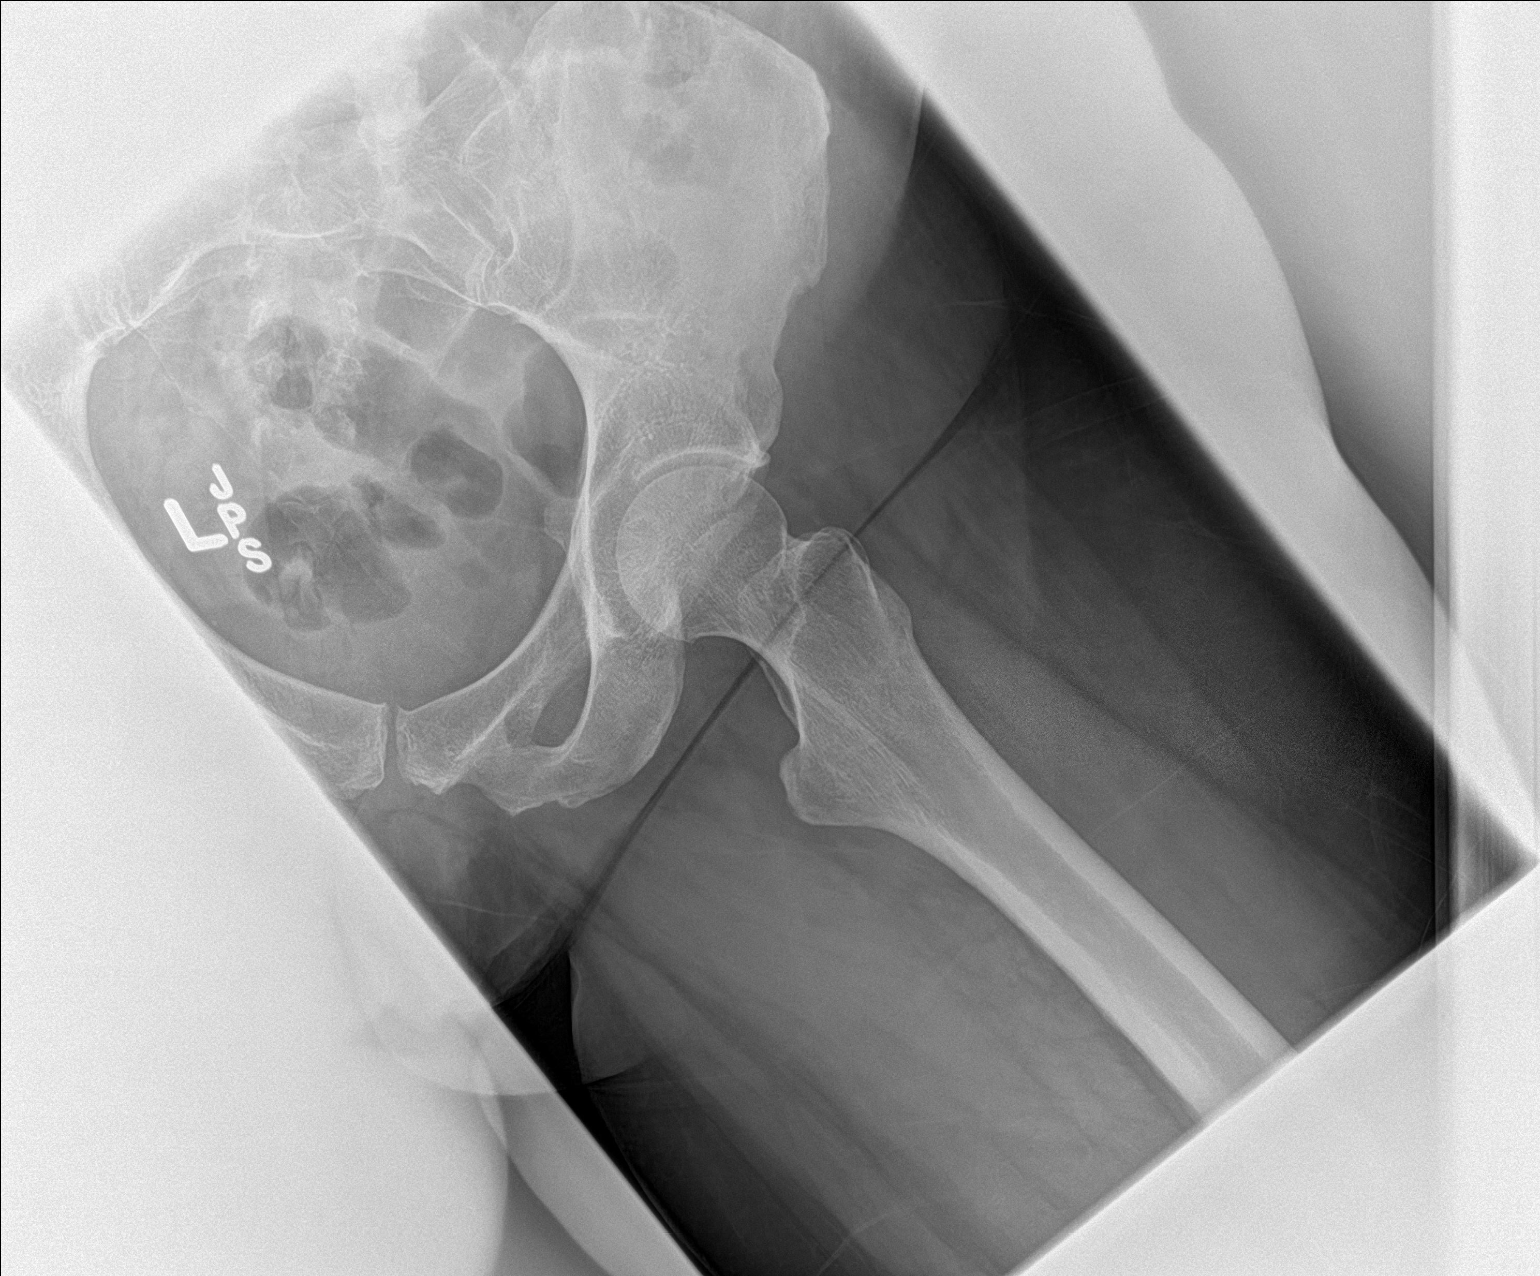

[pelvis ap]
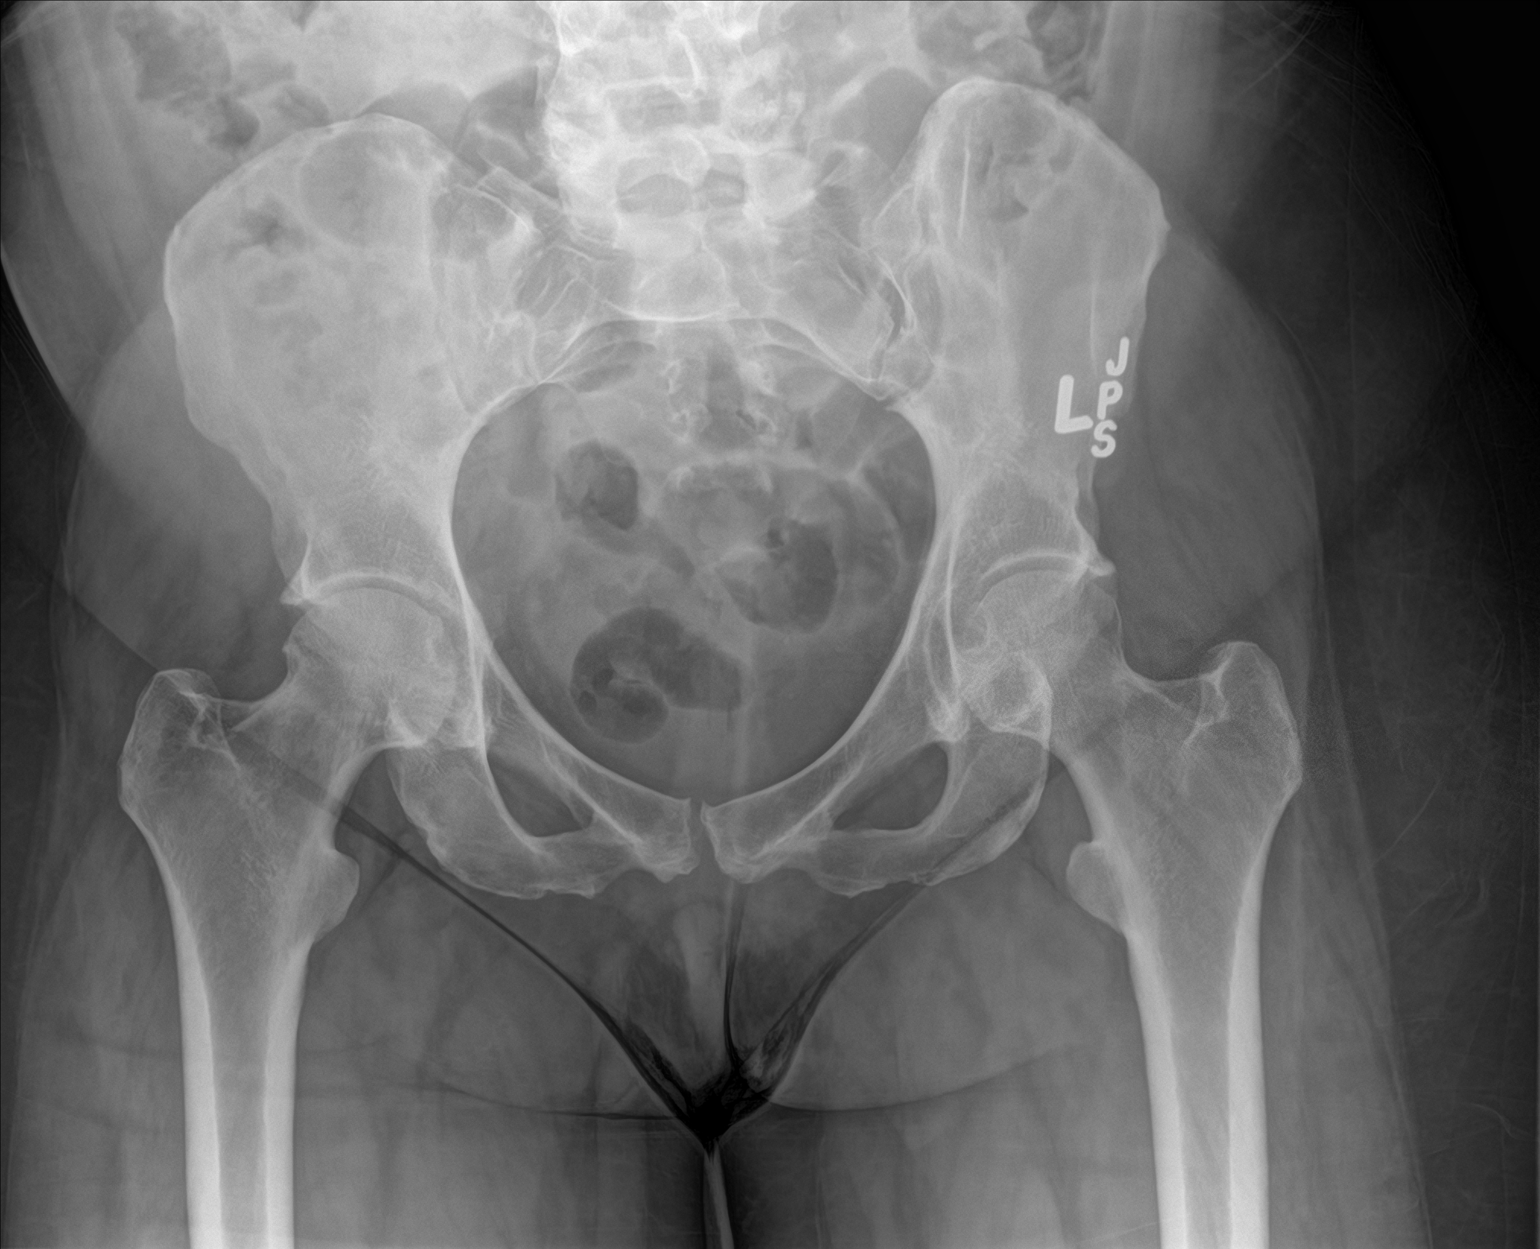

[3 of 3 positions shown; findings below may reference images not displayed]

FINDINGS: There is no evidence of hip fracture or dislocation. There is no
evidence of arthropathy or other focal bone abnormality.
IMPRESSION: Negative.

## 2017-05-15 NOTE — Pre-Procedure Instructions (Signed)
Lynn Gonzalez  05/15/2017      [id number], [place] [address] Phone: [phone] Fax: [phone]    Your procedure is scheduled on May 20, 2017.  Report to [facility] Admitting at 630 AM.  Call this number if you have problems the morning of surgery:  [phone]   Remember:  Do not eat food or drink liquids after midnight.  Take these medicines the morning of surgery with A SIP OF WATER albuterol inhaler (bring with you), amlodipine (norvasc), estradiol (estrace), lithium.  7 days prior to surgery STOP taking any Aspirin, Aleve, Naproxen, Ibuprofen, Motrin, Advil, Goody's, BC's, all herbal medications, fish oil, and all vitamins   Do not wear jewelry, make-up or nail polish.  Do not wear lotions, powders, or perfumes, or deoderant.  Do not shave 48 hours prior to surgery.  Men may shave face and neck.  Do not bring valuables to the hospital.  [facility] is not responsible for any belongings or valuables.  Contacts, dentures or bridgework may not be worn into surgery.  Leave your suitcase in the car.  After surgery it may be brought to your room.  For patients admitted to the hospital, discharge time will be determined by your treatment team.  Patients discharged the day of surgery will not be allowed to drive home.   Special instructions:   [facility]- Preparing For Surgery  Before surgery, you can play an important role. Because skin is not sterile, your skin needs to be as free of germs as possible. You can reduce the number of germs on your skin by washing with CHG (chlorahexidine gluconate) Soap before surgery.  CHG is an antiseptic cleaner which kills germs and bonds with the skin to continue killing germs even after washing.  Please do not use if you have an allergy to CHG or antibacterial soaps. If your skin becomes reddened/irritated stop using the CHG.  Do  not shave (including legs and underarms) for at least 48 hours prior to first CHG shower. It is OK to shave your face.  Please follow these instructions carefully.   1. Shower the NIGHT BEFORE SURGERY and the MORNING OF SURGERY with CHG.   2. If you chose to wash your hair, wash your hair first as usual with your normal shampoo.  3. After you shampoo, rinse your hair and body thoroughly to remove the shampoo.  4. Use CHG as you would any other liquid soap. You can apply CHG directly to the skin and wash gently with a scrungie or a clean washcloth.   5. Apply the CHG Soap to your body ONLY FROM THE NECK DOWN.  Do not use on open wounds or open sores. Avoid contact with your eyes, ears, mouth and genitals (private parts). Wash genitals (private parts) with your normal soap.  6. Wash thoroughly, paying special attention to the area where your surgery will be performed.  7. Thoroughly rinse your body with warm water from the neck down.  8. DO NOT shower/wash with your normal soap after using and rinsing off the CHG Soap.  9. Pat yourself dry with a CLEAN TOWEL.   10. Wear CLEAN PAJAMAS   11. Place CLEAN SHEETS on your bed the night of your first shower and DO NOT SLEEP WITH PETS.    Day of Surgery: Do not apply any deodorants/lotions. Please wear clean clothes to the hospital/surgery center.  Please read over the following fact sheets that you were given. Pain Booklet, Coughing and Deep Breathing, MRSA Information and Surgical Site Infection Prevention

## 2017-05-18 ENCOUNTER — Encounter (HOSPITAL_COMMUNITY): Payer: Self-pay

## 2017-05-18 ENCOUNTER — Encounter (HOSPITAL_COMMUNITY)
Admission: RE | Admit: 2017-05-18 | Discharge: 2017-05-18 | Disposition: A | Discharge status: Home / Self Care | Payer: Medicare Other | Source: Ambulatory Visit | Attending: Surgery | Admitting: Surgery

## 2017-05-18 DIAGNOSIS — E0590 Thyrotoxicosis, unspecified without thyrotoxic crisis or storm: Secondary | ICD-10-CM | POA: Diagnosis not present

## 2017-05-18 DIAGNOSIS — I10 Essential (primary) hypertension: Secondary | ICD-10-CM | POA: Diagnosis not present

## 2017-05-18 DIAGNOSIS — R51 Headache: Secondary | ICD-10-CM | POA: Diagnosis not present

## 2017-05-18 DIAGNOSIS — F419 Anxiety disorder, unspecified: Secondary | ICD-10-CM | POA: Diagnosis not present

## 2017-05-18 DIAGNOSIS — J45909 Unspecified asthma, uncomplicated: Secondary | ICD-10-CM | POA: Diagnosis not present

## 2017-05-18 DIAGNOSIS — F319 Bipolar disorder, unspecified: Secondary | ICD-10-CM | POA: Diagnosis not present

## 2017-05-18 DIAGNOSIS — L732 Hidradenitis suppurativa: Secondary | ICD-10-CM | POA: Diagnosis present

## 2017-05-18 DIAGNOSIS — K219 Gastro-esophageal reflux disease without esophagitis: Secondary | ICD-10-CM | POA: Diagnosis not present

## 2017-05-18 DIAGNOSIS — Z79899 Other long term (current) drug therapy: Secondary | ICD-10-CM | POA: Diagnosis not present

## 2017-05-18 HISTORY — DX: Essential (primary) hypertension: I10

## 2017-05-18 LAB — COMPREHENSIVE METABOLIC PANEL
ALBUMIN: 3.7 g/dL (ref 3.5–5.0)
ALK PHOS: 63 U/L (ref 38–126)
ALT: 10 U/L — ABNORMAL LOW (ref 14–54)
AST: 18 U/L (ref 15–41)
Anion gap: 6 (ref 5–15)
BILIRUBIN TOTAL: 0.4 mg/dL (ref 0.3–1.2)
BUN: 6 mg/dL (ref 6–20)
CO2: 25 mmol/L (ref 22–32)
Calcium: 9.3 mg/dL (ref 8.9–10.3)
Chloride: 105 mmol/L (ref 101–111)
Creatinine, Ser: 0.76 mg/dL (ref 0.44–1.00)
GFR calc Af Amer: >60 mL/min (ref >60)
GFR calc non Af Amer: >60 mL/min (ref >60)
GLUCOSE: 100 mg/dL — AB (ref 65–99)
POTASSIUM: 3.2 mmol/L — AB (ref 3.5–5.1)
Sodium: 136 mmol/L (ref 135–145)
TOTAL PROTEIN: 7.3 g/dL (ref 6.5–8.1)

## 2017-05-18 LAB — CBC WITH DIFFERENTIAL/PLATELET
BASOS ABS: 0 10*3/uL (ref 0.0–0.1)
BASOS PCT: 0 %
Eosinophils Absolute: 0.2 10*3/uL (ref 0.0–0.7)
Eosinophils Relative: 2 %
HEMATOCRIT: 35.5 % — AB (ref 36.0–46.0)
HEMOGLOBIN: 10.9 g/dL — AB (ref 12.0–15.0)
Lymphocytes Relative: 30 %
Lymphs Abs: 2.8 10*3/uL (ref 0.7–4.0)
MCH: 26.7 pg (ref 26.0–34.0)
MCHC: 30.7 g/dL (ref 30.0–36.0)
MCV: 87 fL (ref 78.0–100.0)
Monocytes Absolute: 0.6 10*3/uL (ref 0.1–1.0)
Monocytes Relative: 7 %
NEUTROS ABS: 5.9 10*3/uL (ref 1.7–7.7)
NEUTROS PCT: 61 %
Platelets: 203 10*3/uL (ref 150–400)
RBC: 4.08 MIL/uL (ref 3.87–5.11)
RDW: 14.1 % (ref 11.5–15.5)
WBC: 9.6 10*3/uL (ref 4.0–10.5)

## 2017-05-18 MED ORDER — CHLORHEXIDINE GLUCONATE CLOTH 2 % EX PADS: 6.0000 | MEDICATED_PAD | Freq: Once | CUTANEOUS | Status: DC

## 2017-05-19 MED ORDER — CEFAZOLIN SODIUM-DEXTROSE 2-4 GM/100ML-% IV SOLN
2.0000 g | INTRAVENOUS | Status: AC
  Administered 2017-05-20: 2 g via INTRAVENOUS
  Filled 2017-05-19: qty 100

## 2017-05-20 ENCOUNTER — Encounter (HOSPITAL_COMMUNITY)
Admission: RE | Disposition: A | Discharge status: Home / Self Care | Payer: Self-pay | Source: Ambulatory Visit | Attending: Surgery

## 2017-05-20 ENCOUNTER — Ambulatory Visit (HOSPITAL_COMMUNITY)
Admission: RE | Admit: 2017-05-20 | Discharge: 2017-05-20 | Disposition: A | Discharge status: Home / Self Care | Payer: Medicare Other | Source: Ambulatory Visit | Attending: Surgery | Admitting: Surgery

## 2017-05-20 ENCOUNTER — Encounter (HOSPITAL_COMMUNITY): Payer: Self-pay | Admitting: *Deleted

## 2017-05-20 ENCOUNTER — Ambulatory Visit (HOSPITAL_COMMUNITY): Payer: Medicare Other | Admitting: Certified Registered Nurse Anesthetist

## 2017-05-20 DIAGNOSIS — F319 Bipolar disorder, unspecified: Secondary | ICD-10-CM | POA: Insufficient documentation

## 2017-05-20 DIAGNOSIS — I10 Essential (primary) hypertension: Secondary | ICD-10-CM | POA: Insufficient documentation

## 2017-05-20 DIAGNOSIS — K219 Gastro-esophageal reflux disease without esophagitis: Secondary | ICD-10-CM | POA: Insufficient documentation

## 2017-05-20 DIAGNOSIS — R51 Headache: Secondary | ICD-10-CM | POA: Insufficient documentation

## 2017-05-20 DIAGNOSIS — L732 Hidradenitis suppurativa: Secondary | ICD-10-CM | POA: Diagnosis not present

## 2017-05-20 DIAGNOSIS — F419 Anxiety disorder, unspecified: Secondary | ICD-10-CM | POA: Insufficient documentation

## 2017-05-20 DIAGNOSIS — E0590 Thyrotoxicosis, unspecified without thyrotoxic crisis or storm: Secondary | ICD-10-CM | POA: Insufficient documentation

## 2017-05-20 DIAGNOSIS — Z79899 Other long term (current) drug therapy: Secondary | ICD-10-CM | POA: Insufficient documentation

## 2017-05-20 DIAGNOSIS — J45909 Unspecified asthma, uncomplicated: Secondary | ICD-10-CM | POA: Insufficient documentation

## 2017-05-20 HISTORY — PX: HYDRADENITIS EXCISION: SHX5243

## 2017-05-20 SURGERY — EXCISION, HIDRADENITIS, AXILLA
Anesthesia: General | Site: Axilla | Laterality: Bilateral

## 2017-05-20 MED ORDER — DEXAMETHASONE SODIUM PHOSPHATE 10 MG/ML IJ SOLN
INTRAMUSCULAR | Status: DC | PRN
  Administered 2017-05-20: 10 mg via INTRAVENOUS

## 2017-05-20 MED ORDER — GABAPENTIN 300 MG PO CAPS
300.0000 mg | ORAL_CAPSULE | ORAL | Status: AC
  Administered 2017-05-20: 300 mg via ORAL
  Filled 2017-05-20: qty 1

## 2017-05-20 MED ORDER — CELECOXIB 200 MG PO CAPS
400.0000 mg | ORAL_CAPSULE | ORAL | Status: AC
  Administered 2017-05-20: 400 mg via ORAL
  Filled 2017-05-20: qty 2

## 2017-05-20 MED ORDER — LIDOCAINE HCL (CARDIAC) 20 MG/ML IV SOLN
INTRAVENOUS | Status: DC | PRN
  Administered 2017-05-20: 60 mg via INTRAVENOUS

## 2017-05-20 MED ORDER — LACTATED RINGERS IV SOLN: INTRAVENOUS | Status: DC

## 2017-05-20 MED ORDER — ROCURONIUM BROMIDE 100 MG/10ML IV SOLN
INTRAVENOUS | Status: DC | PRN
  Administered 2017-05-20: 40 mg via INTRAVENOUS

## 2017-05-20 MED ORDER — LACTATED RINGERS IV SOLN
INTRAVENOUS | Status: DC | PRN
  Administered 2017-05-20 (×2): via INTRAVENOUS

## 2017-05-20 MED ORDER — FENTANYL CITRATE (PF) 100 MCG/2ML IJ SOLN
INTRAMUSCULAR | Status: DC | PRN
  Administered 2017-05-20: 100 ug via INTRAVENOUS

## 2017-05-20 MED ORDER — MEPERIDINE HCL 25 MG/ML IJ SOLN: 6.2500 mg | INTRAMUSCULAR | Status: DC | PRN

## 2017-05-20 MED ORDER — 0.9 % SODIUM CHLORIDE (POUR BTL) OPTIME
TOPICAL | Status: DC | PRN
  Administered 2017-05-20: 1000 mL

## 2017-05-20 MED ORDER — ONDANSETRON HCL 4 MG/2ML IJ SOLN
INTRAMUSCULAR | Status: AC
  Filled 2017-05-20: qty 2

## 2017-05-20 MED ORDER — PROPOFOL 10 MG/ML IV BOLUS
INTRAVENOUS | Status: DC | PRN
Start: 2017-05-20 — End: 2017-05-20
  Administered 2017-05-20: 140 mg via INTRAVENOUS

## 2017-05-20 MED ORDER — IBUPROFEN 800 MG PO TABS: 800.0000 mg | ORAL_TABLET | Freq: Three times a day (TID) | ORAL | 0 refills | Status: DC | PRN

## 2017-05-20 MED ORDER — OXYCODONE HCL 5 MG PO TABS: 5.0000 mg | ORAL_TABLET | Freq: Four times a day (QID) | ORAL | 0 refills | Status: DC | PRN

## 2017-05-20 MED ORDER — SCOPOLAMINE 1 MG/3DAYS TD PT72
MEDICATED_PATCH | TRANSDERMAL | Status: DC | PRN
  Administered 2017-05-20: 1 via TRANSDERMAL

## 2017-05-20 MED ORDER — ACETAMINOPHEN 500 MG PO TABS
1000.0000 mg | ORAL_TABLET | ORAL | Status: AC
  Administered 2017-05-20: 1000 mg via ORAL
  Filled 2017-05-20: qty 2

## 2017-05-20 MED ORDER — PHENYLEPHRINE 40 MCG/ML (10ML) SYRINGE FOR IV PUSH (FOR BLOOD PRESSURE SUPPORT)
PREFILLED_SYRINGE | INTRAVENOUS | Status: DC | PRN
  Administered 2017-05-20: 40 ug via INTRAVENOUS
  Administered 2017-05-20: 80 ug via INTRAVENOUS
  Administered 2017-05-20: 40 ug via INTRAVENOUS
  Administered 2017-05-20: 80 ug via INTRAVENOUS

## 2017-05-20 MED ORDER — PROPOFOL 10 MG/ML IV BOLUS
INTRAVENOUS | Status: AC
  Filled 2017-05-20: qty 20

## 2017-05-20 MED ORDER — HYDROMORPHONE HCL 1 MG/ML IJ SOLN: 0.2500 mg | INTRAMUSCULAR | Status: DC | PRN

## 2017-05-20 MED ORDER — ONDANSETRON HCL 4 MG/2ML IJ SOLN
INTRAMUSCULAR | Status: DC | PRN
  Administered 2017-05-20: 4 mg via INTRAVENOUS

## 2017-05-20 MED ORDER — EPHEDRINE 5 MG/ML INJ
INTRAVENOUS | Status: AC
  Filled 2017-05-20: qty 10

## 2017-05-20 MED ORDER — BUPIVACAINE-EPINEPHRINE 0.25% -1:200000 IJ SOLN
INTRAMUSCULAR | Status: DC | PRN
  Administered 2017-05-20: 30 mL

## 2017-05-20 MED ORDER — SUGAMMADEX SODIUM 200 MG/2ML IV SOLN
INTRAVENOUS | Status: DC | PRN
  Administered 2017-05-20: 200 mg via INTRAVENOUS

## 2017-05-20 MED ORDER — SUCCINYLCHOLINE CHLORIDE 200 MG/10ML IV SOSY
PREFILLED_SYRINGE | INTRAVENOUS | Status: AC
  Filled 2017-05-20: qty 10

## 2017-05-20 MED ORDER — FENTANYL CITRATE (PF) 250 MCG/5ML IJ SOLN
INTRAMUSCULAR | Status: AC
  Filled 2017-05-20: qty 5

## 2017-05-20 MED ORDER — MIDAZOLAM HCL 2 MG/2ML IJ SOLN
INTRAMUSCULAR | Status: AC
  Filled 2017-05-20: qty 2

## 2017-05-20 MED ORDER — BUPIVACAINE-EPINEPHRINE (PF) 0.25% -1:200000 IJ SOLN
INTRAMUSCULAR | Status: AC
  Filled 2017-05-20: qty 30

## 2017-05-20 MED ORDER — MIDAZOLAM HCL 5 MG/5ML IJ SOLN
INTRAMUSCULAR | Status: DC | PRN
  Administered 2017-05-20: 1 mg via INTRAVENOUS

## 2017-05-20 SURGICAL SUPPLY — 43 items
APPLIER CLIP 9.375 MED OPEN (MISCELLANEOUS)
BLADE CLIPPER SURG (BLADE) IMPLANT
BLADE SURG 15 STRL LF DISP TIS (BLADE) ×1 IMPLANT
BLADE SURG 15 STRL SS (BLADE) ×1
CANISTER SUCT 3000ML PPV (MISCELLANEOUS) ×2 IMPLANT
CHLORAPREP W/TINT 26ML (MISCELLANEOUS) ×2 IMPLANT
CLIP APPLIE 9.375 MED OPEN (MISCELLANEOUS) IMPLANT
COVER SURGICAL LIGHT HANDLE (MISCELLANEOUS) ×2 IMPLANT
DECANTER SPIKE VIAL GLASS SM (MISCELLANEOUS) ×2 IMPLANT
DERMABOND ADVANCED (GAUZE/BANDAGES/DRESSINGS) ×1
DERMABOND ADVANCED .7 DNX12 (GAUZE/BANDAGES/DRESSINGS) ×1 IMPLANT
DRAIN CHANNEL 19F RND (DRAIN) IMPLANT
DRAPE CHEST BREAST 15X10 FENES (DRAPES) ×2 IMPLANT
DRAPE ORTHO SPLIT 77X108 STRL (DRAPES) ×2
DRAPE SURG ORHT 6 SPLT 77X108 (DRAPES) ×2 IMPLANT
DRSG PAD ABDOMINAL 8X10 ST (GAUZE/BANDAGES/DRESSINGS) ×2 IMPLANT
ELECT CAUTERY BLADE 6.4 (BLADE) ×2 IMPLANT
ELECT REM PT RETURN 9FT ADLT (ELECTROSURGICAL) ×2
ELECTRODE REM PT RTRN 9FT ADLT (ELECTROSURGICAL) ×1 IMPLANT
EVACUATOR SILICONE 100CC (DRAIN) ×2 IMPLANT
GAUZE SPONGE 4X4 12PLY STRL (GAUZE/BANDAGES/DRESSINGS) ×2 IMPLANT
GLOVE BIO SURGEON STRL SZ8 (GLOVE) ×2 IMPLANT
GLOVE BIOGEL PI IND STRL 8 (GLOVE) ×1 IMPLANT
GLOVE BIOGEL PI INDICATOR 8 (GLOVE) ×1
GOWN STRL REUS W/ TWL LRG LVL3 (GOWN DISPOSABLE) ×1 IMPLANT
GOWN STRL REUS W/ TWL XL LVL3 (GOWN DISPOSABLE) ×1 IMPLANT
GOWN STRL REUS W/TWL LRG LVL3 (GOWN DISPOSABLE) ×1
GOWN STRL REUS W/TWL XL LVL3 (GOWN DISPOSABLE) ×1
KIT BASIN OR (CUSTOM PROCEDURE TRAY) ×2 IMPLANT
KIT ROOM TURNOVER OR (KITS) ×2 IMPLANT
NEEDLE HYPO 25GX1X1/2 BEV (NEEDLE) ×2 IMPLANT
NS IRRIG 1000ML POUR BTL (IV SOLUTION) ×2 IMPLANT
PACK GENERAL/GYN (CUSTOM PROCEDURE TRAY) ×2 IMPLANT
PAD ARMBOARD 7.5X6 YLW CONV (MISCELLANEOUS) ×2 IMPLANT
SPECIMEN JAR MEDIUM (MISCELLANEOUS) ×2 IMPLANT
SUT ETHILON 2 0 FS 18 (SUTURE) ×4 IMPLANT
SUT MNCRL AB 4-0 PS2 18 (SUTURE) ×4 IMPLANT
SUT VIC AB 2-0 SH 18 (SUTURE) ×2 IMPLANT
SUT VIC AB 3-0 SH 18 (SUTURE) ×2 IMPLANT
SUT VICRYL AB 3 0 TIES (SUTURE) ×2 IMPLANT
SYR CONTROL 10ML LL (SYRINGE) ×2 IMPLANT
TOWEL OR 17X24 6PK STRL BLUE (TOWEL DISPOSABLE) ×2 IMPLANT
TOWEL OR 17X26 10 PK STRL BLUE (TOWEL DISPOSABLE) ×2 IMPLANT

## 2017-05-20 NOTE — Transfer of Care (Signed)
Immediate Anesthesia Transfer of Care Note  Patient: Lynn Gonzalez  Procedure(s) Performed: EXCISION BILATERAL HIDRADENITIS AXILLA (Bilateral Axilla)  Patient Location: PACU  Anesthesia Type:General  Level of Consciousness: drowsy, patient cooperative and responds to stimulation  Airway & Oxygen Therapy: Patient Spontanous Breathing and Patient connected to nasal cannula oxygen  Post-op Assessment: Report given to RN and Post -op Vital signs reviewed and stable  Post vital signs: Reviewed and stable  Last Vitals:  Vitals:   05/20/17 0654  BP: 113/77  Pulse: 77  Resp: 18  Temp: 36.6 C  SpO2: 97%    Last Pain:  Vitals:   05/20/17 0654  TempSrc: Oral  PainSc: 8       Patients Stated Pain Goal: 3 (04/88/89 1694)  Complications: No apparent anesthesia complications

## 2017-05-20 NOTE — H&P (View-Only) (Signed)
Lynn Gonzalez 04/24/2017 10:22 AM Location: [facility] Patient #: [id number] DOB: May 18, 1961 Single / Language: Cleophus Gonzalez / Race: Black or African American Female  History of Present Illness Lynn Gonzalez A. Lynn Hefter MD; 04/24/2017 10:41 AM) Patient words: Patient sent at the request of Dr. 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 She is had this condition for many years. She's been managed medically for many years with mediocre control symptoms. She does not have any recent flareups of drainage, redness or pain. The location of diseases bilateral axilla. She has never had surgery in the past for this condition. The condition is progressing and not improving with medical management.  The patient is a 56 year old female.   Past Surgical History Lynn Gonzalez, [place]; 04/24/2017 10:22 AM) Breast Biopsy Right. Hysterectomy (not due to cancer) - Complete  Diagnostic Studies History Lynn Gonzalez, [place]; 04/24/2017 10:22 AM) Mammogram 1-3 years ago Pap Smear 1-5 years ago  Allergies Lynn Gonzalez, RMA; 04/24/2017 10:24 AM) Latex Iodinated Contrast Media Adhesive 1"x6yd *MEDICAL DEVICES AND SUPPLIES*  Medication History Lynn Gonzalez, RMA; 04/24/2017 10:25 AM) AmLODIPine Besylate (5MG  Tablet, Oral) Active. Estradiol (2MG  Tablet, Oral) Active. Lithium Carbonate (300MG  Tablet, Oral) Active. QUEtiapine Fumarate (300MG  Tablet, Oral) Active. ProAir HFA (108 (90 Base)MCG/ACT Aerosol Soln, Inhalation) Active. Medications Reconciled  Social History Lynn Gonzalez, [place]; 04/24/2017 10:22 AM) No alcohol use No drug use Tobacco use Never smoker.  Family History Lynn Gonzalez, [place]; 04/24/2017 10:22 AM) Colon Cancer Father. Diabetes Mellitus Mother. Hypertension Father, Mother, Sister. Prostate Cancer Father.  Pregnancy / Birth History Lynn Gonzalez, [place]; 04/24/2017 10:22 AM) Age at menarche 81 years. Age of menopause 72-50 Gravida 3 Maternal age 38-20 Para  2  Other Problems Lynn Gonzalez, [place]; 04/24/2017 10:22 AM) Asthma Back Pain High blood pressure     Review of Systems Lynn Gonzalez RMA; 04/24/2017 10:22 AM) Skin Present- Hives and Rash. Not Present- Change in Wart/Mole, Dryness, Jaundice, New Lesions, Non-Healing Wounds and Ulcer. HEENT Present- Wears glasses/contact lenses. Not Present- Earache, Hearing Loss, Hoarseness, Nose Bleed, Oral Ulcers, Ringing in the Ears, Seasonal Allergies, Sinus Pain, Sore Throat, Visual Disturbances and Yellow Eyes. Breast Not Present- Breast Mass, Breast Pain, Nipple Discharge and Skin Changes. Cardiovascular Not Present- Chest Pain, Difficulty Breathing Lying Down, Leg Cramps, Palpitations, Rapid Heart Rate, Shortness of Breath and Swelling of Extremities. Gastrointestinal Present- Abdominal Pain, Change in Bowel Habits and Chronic diarrhea. Not Present- Bloating, Bloody Stool, Constipation, Difficulty Swallowing, Excessive gas, Gets full quickly at meals, Hemorrhoids, Indigestion, Nausea, Rectal Pain and Vomiting. Musculoskeletal Present- Back Pain. Not Present- Joint Pain, Joint Stiffness, Muscle Pain, Muscle Weakness and Swelling of Extremities. Neurological Not Present- Decreased Memory, Fainting, Headaches, Numbness, Seizures, Tingling, Tremor, Trouble walking and Weakness. Psychiatric Present- Bipolar and Depression. Not Present- Anxiety, Change in Sleep Pattern, Fearful and Frequent crying. Endocrine Not Present- Cold Intolerance, Excessive Hunger, Hair Changes, Heat Intolerance, Hot flashes and New Diabetes. Hematology Not Present- Blood Thinners, Easy Bruising, Excessive bleeding, Gland problems, HIV and Persistent Infections.  Vitals Lynn Gonzalez RMA; 04/24/2017 10:25 AM) 04/24/2017 10:25 AM Weight: 177.8 lb Height: 66in Body Surface Area: 1.9 m Body Mass Index: 28.7 kg/m  Temp.: 98.39F  Pulse: 97 (Regular)  BP: 125/72 (Sitting, Left Arm, Standard)      Physical Exam (Cordia Gonzalez  A. Annissa Andreoni MD; 04/24/2017 10:42 AM)  General Mental Status-Alert. General Appearance-Consistent with stated age. Hydration-Well hydrated. Voice-Normal.  Integumentary Note: Bilateral July have areas of roughly 8 x 7 cm which is sporadic to both axilla  of hidradenitis without signs of active infection currently. There is mild tenderness to bilateral axilla. No lymphadenopathy noted. No draining wounds or fluctuance noted.  Chest and Lung Exam Chest and lung exam reveals -quiet, even and easy respiratory effort with no use of accessory muscles and on auscultation, normal breath sounds, no adventitious sounds and normal vocal resonance. Inspection Chest Wall - Normal. Back - normal. Note: Scars from previous surgery noted.  Cardiovascular Cardiovascular examination reveals -normal heart sounds, regular rate and rhythm with no murmurs and normal pedal pulses bilaterally.  Neurologic Neurologic evaluation reveals -alert and oriented x 3 with no impairment of recent or remote memory. Mental Status-Normal.  Musculoskeletal Normal Exam - Left-Upper Extremity Strength Normal and Lower Extremity Strength Normal. Normal Exam - Right-Upper Extremity Strength Normal and Lower Extremity Strength Normal.  Lymphatic Axillary  General Axillary Region: Bilateral - Description - Normal. Tenderness - Non Tender.    Assessment & Plan (Kyrian Stage A. Ciera Beckum MD; 04/24/2017 10:47 AM)  HIDRADENITIS AXILLARIS (L73.2) Impression: Discussed medical and surgical options for treatment. The risk and benefits of each discussed as well as long-term expectations. She has been managed medically for many years but unfortunately her condition his progress. She will require excision with possible advancement flap closure of both areas. I discussed what happens after surgery as well as long-term expectations and the possibility of contracture formation and loss of function of upper extremity requiring  physical therapy and/or additional surgery to restore that. I discussed wound complications, poor wound healing, use of drains, and the need further treatments and/or other modalities.  Current Plans Pt Education - [facility] Hidradenitis The anatomy and physiology of sweat glands and skin creases was discussed. Pathophysiology of hidradenitis suppurativa was discussed. I stressed good hygiene, avoiding razors and avoiding antiperspirants. Consideration of excision with primary closure versus need to place a drain, open packing, possible skin flaps/grafts was discussed.  Possibility of recurrence was discussed. Risks, benefits, alternatives were discussed. I noted a good likelihood this will help address the problem. Risks of anesthesia and other risks discussed. Questions answered. The patient is considering surgery. They wish to proceed.  HIDRADENITIS SUPPURATIVA OF LEFT AXILLA (L73.2)  HIDRADENITIS SUPPURATIVA OF RIGHT AXILLA (L73.2)

## 2017-05-20 NOTE — Anesthesia Postprocedure Evaluation (Signed)
Anesthesia Post Note  Patient: Lynn Gonzalez  Procedure(s) Performed: EXCISION BILATERAL HIDRADENITIS AXILLA (Bilateral Axilla)     Patient location during evaluation: PACU Anesthesia Type: General Level of consciousness: awake and alert Pain management: pain level controlled Vital Signs Assessment: post-procedure vital signs reviewed and stable Respiratory status: spontaneous breathing, nonlabored ventilation, respiratory function stable and patient connected to nasal cannula oxygen Cardiovascular status: blood pressure returned to baseline and stable Postop Assessment: no apparent nausea or vomiting Anesthetic complications: no    Last Vitals:  Vitals:   05/20/17 1058 05/20/17 1113  BP: 134/84 137/90  Pulse: (!) 59 (!) 59  Resp: 15 15  Temp:  (!) 36.3 C  SpO2: 98% 96%    Last Pain:  Vitals:   05/20/17 1125  TempSrc:   PainSc: Asleep                 Effie Berkshire

## 2017-05-20 NOTE — Op Note (Signed)
Preoperative diagnosis: Bilateral complex hidradenitis axillaris   Postoperative diagnosis: Same  Procedure: Excision of bilateral hidradenitis axillaris measuring 4 cm x 3 cm in the right and 2 cm x 3 cm the left with VY advancement flaps  Surgeon: Erroll Luna M.D.  Anesthesia: Gen. with local  EBL: Minimal  Specimen: Skin to pathology from both axilla  Drains: None  Indications for procedure: The patient presents with bilateral hidradenitis in both axilla. She's been managed medically for some time and has had local incision and drainage procedures. Unfortunately, this has not resolved with medical management and she desires excision. This was discussed with the patient as well as potential complications of recurrence, numbness, pain, decreased range of motion of both upper extremity, contracture formation, the need for revisional surgery, poor wound healing, open wound care, and other potential complications of this procedure. The other option is continued intermittent medical observation and treatment. She opted for bilateral excisions. The procedure has been discussed with the patient.  Alternative therapies have been discussed with the patient.  Operative risks include bleeding,  Infection,  Organ injury,  Nerve injury,  Blood vessel injury,  DVT,  Pulmonary embolism,  Death,  And possible reoperation.  Medical management risks include worsening of present situation.  The success of the procedure is 50 -90 % at treating patients symptoms.  The patient understands and agrees to proceed.   Description of procedure: The patient was seen in the holding area. Questions are answered. He was brought to the operating room and placed upon the OR table with both arms at 90 angles. Gen. anesthesia was initiated. Both axilla were prepped and draped in a sterile fashion and timeout was done. The right side was done first. She had an area of 3 cm x 4 cm of disease with no active component. A VY  incision was used to excise the area. Flaps were subcutaneous flaps and these were made with cautery. We then closed the defect circumferentially with a deep layer of 3-0 Vicryl and 4-0 Monocryl. The flap was advanced to do this. This was mobilized for 2-3 cm circumferentially. Hemostasis was achieved prior to closing.  The Was done the summer fashion. The VY incision was made in the left axilla. The area of disease with 2 x 3 cm. It was excised. Hemostasis was achieved. The flap was advanced after mobilization the skin further with cautery. This was enclosed with 3-0 Vicryl for Monocryl. Dermabond applied. All final counts found to be correct. The patient was awoke extubated taken to recovery in satisfactory condition.

## 2017-05-20 NOTE — Discharge Instructions (Signed)
GENERAL SURGERY: POST OP INSTRUCTIONS ° °###################################################################### ° °EAT °Gradually transition to a high fiber diet with a fiber supplement over the next few weeks after discharge.  Start with a pureed / full liquid diet (see below) ° °WALK °Walk an hour a day.  Control your pain to do that.   ° °CONTROL PAIN °Control pain so that you can walk, sleep, tolerate sneezing/coughing, go up/down stairs. ° °HAVE A BOWEL MOVEMENT DAILY °Keep your bowels regular to avoid problems.  OK to try a laxative to override constipation.  OK to use an antidairrheal to slow down diarrhea.  Call if not better after 2 tries ° °CALL IF YOU HAVE PROBLEMS/CONCERNS °Call if you are still struggling despite following these instructions. °Call if you have concerns not answered by these instructions ° °###################################################################### ° ° ° °1. DIET: Follow a light bland diet the first 24 hours after arrival home, such as soup, liquids, crackers, etc.  Be sure to include lots of fluids daily.  Avoid fast food or heavy meals as your are more likely to get nauseated.   °2. Take your usually prescribed home medications unless otherwise directed. °3. PAIN CONTROL: °a. Pain is best controlled by a usual combination of three different methods TOGETHER: °i. Ice/Heat °ii. Over the counter pain medication °iii. Prescription pain medication °b. Most patients will experience some swelling and bruising around the incisions.  Ice packs or heating pads (30-60 minutes up to 6 times a day) will help. Use ice for the first few days to help decrease swelling and bruising, then switch to heat to help relax tight/sore spots and speed recovery.  Some people prefer to use ice alone, heat alone, alternating between ice & heat.  Experiment to what works for you.  Swelling and bruising can take several weeks to resolve.   °c. It is helpful to take an over-the-counter pain medication  regularly for the first few weeks.  Choose one of the following that works best for you: °i. Naproxen (Aleve, etc)  Two 220mg tabs twice a day °ii. Ibuprofen (Advil, etc) Three 200mg tabs four times a day (every meal & bedtime) °iii. Acetaminophen (Tylenol, etc) 500-650mg four times a day (every meal & bedtime) °d. A  prescription for pain medication (such as oxycodone, hydrocodone, etc) should be given to you upon discharge.  Take your pain medication as prescribed.  °i. If you are having problems/concerns with the prescription medicine (does not control pain, nausea, vomiting, rash, itching, etc), please call us [phone] to see if we need to switch you to a different pain medicine that will work better for you and/or control your side effect better. °ii. If you need a refill on your pain medication, please contact your pharmacy.  They will contact our office to request authorization. Prescriptions will not be filled after 5 pm or on week-ends. °4. Avoid getting constipated.  Between the surgery and the pain medications, it is common to experience some constipation.  Increasing fluid intake and taking a fiber supplement (such as Metamucil, Citrucel, FiberCon, MiraLax, etc) 1-2 times a day regularly will usually help prevent this problem from occurring.  A mild laxative (prune juice, Milk of Magnesia, MiraLax, etc) should be taken according to package directions if there are no bowel movements after 48 hours.   °5. Wash / shower every day.  You may shower over the dressings as they are waterproof.  Continue to shower over incision(s) after the dressing is off. °6. Remove your waterproof bandages   5 days after surgery.  You may leave the incision open to air.  You may have skin tapes (Steri Strips) covering the incision(s).  Leave them on until one week, then remove.  You may replace a dressing/Band-Aid to cover the incision for comfort if you wish.  ° ° ° ° °7. ACTIVITIES as tolerated:   °a. You may resume  regular (light) daily activities beginning the next day--such as daily self-care, walking, climbing stairs--gradually increasing activities as tolerated.  If you can walk 30 minutes without difficulty, it is safe to try more intense activity such as jogging, treadmill, bicycling, low-impact aerobics, swimming, etc. °b. Save the most intensive and strenuous activity for last such as sit-ups, heavy lifting, contact sports, etc  Refrain from any heavy lifting or straining until you are off narcotics for pain control.   °c. DO NOT PUSH THROUGH PAIN.  Let pain be your guide: If it hurts to do something, don't do it.  Pain is your body warning you to avoid that activity for another week until the pain goes down. °d. You may drive when you are no longer taking prescription pain medication, you can comfortably wear a seatbelt, and you can safely maneuver your car and apply brakes. °e. You may have sexual intercourse when it is comfortable.  °8. FOLLOW UP in our office °a. Please call [facility] at [phone] to set up an appointment to see your surgeon in the office for a follow-up appointment approximately 2-3 weeks after your surgery. °b. Make sure that you call for this appointment the day you arrive home to insure a convenient appointment time. °9. IF YOU HAVE DISABILITY OR FAMILY LEAVE FORMS, BRING THEM TO THE OFFICE FOR PROCESSING.  DO NOT GIVE THEM TO YOUR DOCTOR. ° ° °WHEN TO CALL US [phone]: °1. Poor pain control °2. Reactions / problems with new medications (rash/itching, nausea, etc)  °3. Fever over 101.5 F (38.5 C) °4. Worsening swelling or bruising °5. Continued bleeding from incision. °6. Increased pain, redness, or drainage from the incision °7. Difficulty breathing / swallowing ° ° The clinic staff is available to answer your questions during regular business hours (8:30am-5pm).  Please don’t hesitate to call and ask to speak to one of our nurses for clinical concerns.  ° If you have a medical emergency,  go to the nearest emergency room or call 911. ° A surgeon from [facility] is always on call at the hospitals ° ° °[facility] °[address], [address], [place], [place]  [address] ? °MAIN: [phone] ? TOLL FREE: [phone] ?  °FAX [phone] °[email or web address] ° °

## 2017-05-20 NOTE — Anesthesia Preprocedure Evaluation (Addendum)
Anesthesia Evaluation  Patient identified by MRN, date of birth, ID band Patient awake    Reviewed: Allergy & Precautions, NPO status , Patient's Chart, lab work & pertinent test results  Airway Mallampati: III  TM Distance: >3 FB Neck ROM: Full    Dental  (+) Edentulous Upper, Dental Advisory Given   Pulmonary asthma ,    breath sounds clear to auscultation       Cardiovascular hypertension, Pt. on medications negative cardio ROS   Rhythm:Regular Rate:Normal     Neuro/Psych  Headaches, PSYCHIATRIC DISORDERS Anxiety Bipolar Disorder    GI/Hepatic Neg liver ROS, GERD  Controlled,  Endo/Other  Hyperthyroidism   Renal/GU negative Renal ROS     Musculoskeletal negative musculoskeletal ROS (+)   Abdominal   Peds  Hematology negative hematology ROS (+)   Anesthesia Other Findings Day of surgery medications reviewed with the patient.  Reproductive/Obstetrics                           Lab Results  Component Value Date   WBC 9.6 05/18/2017   HGB 10.9 (L) 05/18/2017   HCT 35.5 (L) 05/18/2017   MCV 87.0 05/18/2017   PLT 203 05/18/2017   Lab Results  Component Value Date   CREATININE 0.76 05/18/2017   BUN 6 05/18/2017   NA 136 05/18/2017   K 3.2 (L) 05/18/2017   CL 105 05/18/2017   CO2 25 05/18/2017   Echo: - Left ventricle: The cavity size was normal. There was mild   concentric hypertrophy. Systolic function was vigorous. The   estimated ejection fraction was in the range of 65% to 70%. Wall   motion was normal; there were no regional wall motion   abnormalities. The study is not technically sufficient to allow   evaluation of LV diastolic function. - Aortic valve: Transvalvular velocity was within the normal range.   There was no stenosis. There was no regurgitation. - Mitral valve: Transvalvular velocity was within the normal range.   There was no evidence for stenosis. There was  trivial   regurgitation. - Left atrium: The atrium was moderately dilated. - Right ventricle: The cavity size was normal. Wall thickness was   normal. Systolic function was normal. - Atrial septum: No defect or patent foramen ovale was identified   by color flow Doppler. - Tricuspid valve: There was mild regurgitation. - Pulmonary arteries: Systolic pressure was within the normal   range. PA peak pressure: 24 mm Hg (S). - Pericardium, extracardiac: A trivial pericardial effusion was   identified.  Anesthesia Physical Anesthesia Plan  ASA: II  Anesthesia Plan: General   Post-op Pain Management:    Induction: Intravenous  PONV Risk Score and Plan: 4 or greater and Ondansetron, Dexamethasone, Midazolam, Scopolamine patch - Pre-op and Treatment may vary due to age or medical condition  Airway Management Planned: Oral ETT  Additional Equipment:   Intra-op Plan:   Post-operative Plan: Extubation in OR  Informed Consent: I have reviewed the patients History and Physical, chart, labs and discussed the procedure including the risks, benefits and alternatives for the proposed anesthesia with the patient or authorized representative who has indicated his/her understanding and acceptance.   Dental advisory given  Plan Discussed with: CRNA, Anesthesiologist and Surgeon  Anesthesia Plan Comments:        Anesthesia Quick Evaluation

## 2017-05-20 NOTE — Anesthesia Procedure Notes (Signed)
Procedure Name: Intubation Date/Time: 05/20/2017 8:41 AM Performed by: Carney Living Pre-anesthesia Checklist: Patient identified, Emergency Drugs available, Suction available, Patient being monitored and Timeout performed Patient Re-evaluated:Patient Re-evaluated prior to induction Oxygen Delivery Method: Circle system utilized Preoxygenation: Pre-oxygenation with 100% oxygen Induction Type: IV induction Ventilation: Mask ventilation without difficulty Laryngoscope Size: Mac and 4 Grade View: Grade I Tube type: Oral Tube size: 7.5 mm Number of attempts: 1 Airway Equipment and Method: Stylet Placement Confirmation: ETT inserted through vocal cords under direct vision,  positive ETCO2 and breath sounds checked- equal and bilateral Secured at: 21 cm Tube secured with: Tape Dental Injury: Teeth and Oropharynx as per pre-operative assessment

## 2017-05-20 NOTE — Interval H&P Note (Signed)
History and Physical Interval Note:  05/20/2017 8:03 AM  Lynn Gonzalez  has presented today for surgery, with the diagnosis of hidradenitis  The various methods of treatment have been discussed with the patient and family. After consideration of risks, benefits and other options for treatment, the patient has consented to  Procedure(s): EXCISION BILATERAL HIDRADENITIS AXILLA WITH ADVANCEMENT FLAP CLOSURE (Bilateral) as a surgical intervention .  The patient's history has been reviewed, patient examined, no change in status, stable for surgery.  I have reviewed the patient's chart and labs.  Questions were answered to the patient's satisfaction.     Iosefa Weintraub A.

## 2017-05-21 ENCOUNTER — Encounter (HOSPITAL_COMMUNITY): Payer: Self-pay | Admitting: Surgery

## 2018-03-19 ENCOUNTER — Other Ambulatory Visit: Payer: Self-pay

## 2018-03-19 ENCOUNTER — Encounter (HOSPITAL_COMMUNITY): Payer: Self-pay

## 2018-03-19 ENCOUNTER — Emergency Department (HOSPITAL_COMMUNITY): Payer: Medicare Other

## 2018-03-19 ENCOUNTER — Emergency Department (HOSPITAL_COMMUNITY)
Admission: EM | Admit: 2018-03-19 | Discharge: 2018-03-19 | Discharge status: Against Medical Advice | Payer: Medicare Other | Source: Home / Self Care

## 2018-03-19 ENCOUNTER — Emergency Department (HOSPITAL_COMMUNITY)
Admission: EM | Admit: 2018-03-19 | Discharge: 2018-03-19 | Disposition: A | Discharge status: Home / Self Care | Payer: Medicare Other | Attending: Emergency Medicine | Admitting: Emergency Medicine

## 2018-03-19 DIAGNOSIS — E039 Hypothyroidism, unspecified: Secondary | ICD-10-CM | POA: Insufficient documentation

## 2018-03-19 DIAGNOSIS — S99911A Unspecified injury of right ankle, initial encounter: Secondary | ICD-10-CM | POA: Insufficient documentation

## 2018-03-19 DIAGNOSIS — Y999 Unspecified external cause status: Secondary | ICD-10-CM | POA: Insufficient documentation

## 2018-03-19 DIAGNOSIS — I10 Essential (primary) hypertension: Secondary | ICD-10-CM | POA: Diagnosis not present

## 2018-03-19 DIAGNOSIS — Y929 Unspecified place or not applicable: Secondary | ICD-10-CM | POA: Insufficient documentation

## 2018-03-19 DIAGNOSIS — W208XXA Other cause of strike by thrown, projected or falling object, initial encounter: Secondary | ICD-10-CM | POA: Insufficient documentation

## 2018-03-19 DIAGNOSIS — Y939 Activity, unspecified: Secondary | ICD-10-CM | POA: Insufficient documentation

## 2018-03-19 DIAGNOSIS — Z79899 Other long term (current) drug therapy: Secondary | ICD-10-CM | POA: Insufficient documentation

## 2018-03-19 NOTE — ED Notes (Signed)
Called for Pt. No answer x3

## 2018-03-19 NOTE — ED Triage Notes (Signed)
Pt c/o right ankle pain after a shopping cart ran into the back of her ankle around 11:30 am today. Pt ambulatory to triage. Pt took 1000mg  acetaminophen around 12:30 pm

## 2018-03-19 NOTE — ED Notes (Signed)
No answer in waiting room 

## 2018-03-19 NOTE — Discharge Instructions (Addendum)
You have been seen today for an ankle injury. There were no acute abnormalities on the x-rays, including no sign of fracture or dislocation, however, there could be injuries to the soft tissues, such as the ligaments or tendons that are not seen on xrays. There could also be what are called occult fractures that are small fractures not seen on xray. Pain: Take 600 mg of ibuprofen every 6 hours or 440 mg (over the counter dose) to 500 mg (prescription dose) of naproxen every 12 hours for the next 3 days. After this time, these medications may be used as needed for pain. Take these medications with food to avoid upset stomach. Choose only one of these medications, do not take them together.  Tylenol: Should you continue to have additional pain while taking the ibuprofen or naproxen, you may add in tylenol as needed. Your daily total maximum amount of tylenol from all sources should be limited to 4000mg /day for persons without liver problems, or 2000mg /day for those with liver problems. Ice: May apply ice to the area over the next 24 hours for 15 minutes at a time to reduce swelling. Elevation: Keep the extremity elevated as often as possible to reduce pain and inflammation. Support: Wear the ankle brace for support and comfort. Wear this until pain resolves. You will be weight-bearing as tolerated, which means you can slowly start to put weight on the extremity and increase amount and frequency as pain allows. Exercises: Start by performing these exercises a few times a week, increasing the frequency until you are performing them twice daily.  Follow up: If symptoms are improving, you may follow up with your primary care provider for any continued management. If symptoms are not starting to improve within a week, you should follow up with the orthopedic specialist within two weeks. Return: Return to the ED for numbness, weakness, increasing pain, overall worsening symptoms, loss of function, or if symptoms are  not improving, you have tried to follow up with the orthopedic specialist, and have been unable to do so.
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
## 2018-03-30 ENCOUNTER — Ambulatory Visit: Payer: Medicare Other

## 2018-03-30 ENCOUNTER — Encounter (INDEPENDENT_AMBULATORY_CARE_PROVIDER_SITE_OTHER): Payer: Self-pay | Admitting: Podiatry

## 2018-03-30 DIAGNOSIS — M779 Enthesopathy, unspecified: Secondary | ICD-10-CM

## 2018-03-30 NOTE — Progress Notes (Signed)
This encounter was created in error - please disregard.

## 2018-04-01 ENCOUNTER — Ambulatory Visit (INDEPENDENT_AMBULATORY_CARE_PROVIDER_SITE_OTHER): Payer: Medicare Other | Admitting: Orthopedic Surgery

## 2018-04-01 ENCOUNTER — Encounter (INDEPENDENT_AMBULATORY_CARE_PROVIDER_SITE_OTHER): Payer: Self-pay | Admitting: Orthopedic Surgery

## 2018-04-01 ENCOUNTER — Ambulatory Visit (INDEPENDENT_AMBULATORY_CARE_PROVIDER_SITE_OTHER): Payer: Self-pay

## 2018-04-01 DIAGNOSIS — M25571 Pain in right ankle and joints of right foot: Secondary | ICD-10-CM | POA: Diagnosis not present

## 2018-04-01 DIAGNOSIS — S93411A Sprain of calcaneofibular ligament of right ankle, initial encounter: Secondary | ICD-10-CM
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
## 2018-04-08 ENCOUNTER — Other Ambulatory Visit: Payer: Self-pay | Admitting: Nurse Practitioner

## 2018-04-08 DIAGNOSIS — Z1231 Encounter for screening mammogram for malignant neoplasm of breast: Secondary | ICD-10-CM

## 2018-04-29 ENCOUNTER — Ambulatory Visit (INDEPENDENT_AMBULATORY_CARE_PROVIDER_SITE_OTHER): Payer: Medicare Other | Admitting: Orthopedic Surgery

## 2018-04-29 ENCOUNTER — Encounter (INDEPENDENT_AMBULATORY_CARE_PROVIDER_SITE_OTHER): Payer: Self-pay | Admitting: Orthopedic Surgery

## 2018-04-29 VITALS — Ht 66.0 in | Wt 170.0 lb

## 2018-04-29 DIAGNOSIS — S93491S Sprain of other ligament of right ankle, sequela: Secondary | ICD-10-CM | POA: Diagnosis not present

## 2018-04-29 DIAGNOSIS — M25571 Pain in right ankle and joints of right foot: Secondary | ICD-10-CM | POA: Diagnosis not present

## 2018-04-29 DIAGNOSIS — S93491A Sprain of other ligament of right ankle, initial encounter: Secondary | ICD-10-CM | POA: Insufficient documentation
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
## 2018-05-03 ENCOUNTER — Ambulatory Visit
Admission: RE | Admit: 2018-05-03 | Discharge: 2018-05-03 | Disposition: A | Discharge status: Home / Self Care | Payer: Medicare Other | Source: Ambulatory Visit | Attending: Nurse Practitioner | Admitting: Nurse Practitioner

## 2018-05-03 DIAGNOSIS — Z1231 Encounter for screening mammogram for malignant neoplasm of breast: Secondary | ICD-10-CM

## 2018-05-27 ENCOUNTER — Ambulatory Visit (INDEPENDENT_AMBULATORY_CARE_PROVIDER_SITE_OTHER): Payer: Medicare Other | Admitting: Orthopedic Surgery

## 2018-05-27 ENCOUNTER — Encounter (INDEPENDENT_AMBULATORY_CARE_PROVIDER_SITE_OTHER): Payer: Self-pay | Admitting: Orthopedic Surgery

## 2018-05-27 VITALS — Ht 66.0 in | Wt 170.0 lb

## 2018-05-27 DIAGNOSIS — M25571 Pain in right ankle and joints of right foot: Secondary | ICD-10-CM | POA: Diagnosis not present

## 2018-05-27 NOTE — Progress Notes (Signed)
Office Visit Note   Patient: Sharmeka Palmisano Finamore           Date of Birth: [birth year]           MRN: [id number] Visit Date: 05/27/2018              Requested by: Javier Docker, MD [address] [place], [place] [address] PCP: Javier Docker, MD  Chief Complaint  Patient presents with  . Right Foot - Follow-up      HPI: Patient is a 57 year old woman who is seen in follow-up for right ankle pain.  She has been using the ankle stabilizing orthosis and fracture boot for about 3 months.  She states that the fracture boot has helped with her symptoms significantly.  She states she feels much better.  Assessment & Plan: Visit Diagnoses:  1. Pain in right ankle and joints of right foot     Plan: Patient will wean out of fracture boot use her ASO with wide sneakers.  As per her request, patient was given a note that she is released from care at this time without restrictions.  Follow-Up Instructions: Return if symptoms worsen or fail to improve.   Ortho Exam  Patient is alert, oriented, no adenopathy, well-dressed, normal affect, normal respiratory effort. Examination patient has minimal tenderness to palpation of the anterior aspect the ankle there is no pain with range of motion of the ankle or subtalar joint.  Medial lateral malleolus are nontender to palpation.  The Achilles posterior tibial tendon and peroneal tendons are nontender to palpation there is no redness no cellulitis no swelling no signs of infection.  Imaging: No results found. No images are attached to the encounter.  Labs: Lab Results  Component Value Date   HGBA1C 5.2 06/25/2016   ESRSEDRATE 18 06/24/2013   ESRSEDRATE 15 03/01/2010   LABURIC 4.7 09/03/2011   LABURIC 7.6 (H) 03/03/2011     Lab Results  Component Value Date   ALBUMIN 3.7 05/18/2017   ALBUMIN 3.9 10/28/2016   ALBUMIN 3.6 06/25/2016   LABURIC 4.7 09/03/2011   LABURIC 7.6 (H) 03/03/2011    Body mass index is 27.44  kg/m.  Orders:  No orders of the defined types were placed in this encounter.  No orders of the defined types were placed in this encounter.    Procedures: No procedures performed  Clinical Data: No additional findings.  ROS:  All other systems negative, except as noted in the HPI. Review of Systems  Objective: Vital Signs: Ht 5\' 6"  (1.676 m)   Wt 170 lb (77.1 kg)   LMP 04/18/2010   BMI 27.44 kg/m   Specialty Comments:  No specialty comments available.  PMFS History: Patient Active Problem List   Diagnosis Date Noted  . Sprain of anterior talofibular ligament of right ankle 04/29/2018  . Paresthesias 06/25/2016  . Headache 06/25/2016  . Hypokalemia 06/25/2016  . Prolonged Q-T interval on ECG 06/25/2016  . Thumb numbness 10/28/2013  . Hypertension 10/24/2013  . Hip pain 06/21/2013  . Low back pain radiating to right leg 05/04/2013  . Elevated blood pressure 10/12/2012  . Snoring 04/07/2012  . GERD (gastroesophageal reflux disease) 01/22/2012  . Gout 03/12/2011  . GOITER 10/21/2010  . BREAST CYST, LEFT 07/23/2010  . B12 DEFICIENCY 03/08/2010  . Bipolar 1 disorder ([place]) 03/08/2010  . HYPERTHYROIDISM 02/11/2010  . ANEMIA 02/11/2010  . INTERSTITIAL LUNG DISEASE 02/11/2010  . GASTROESOPHAGEAL REFLUX DISEASE 02/11/2010   Past Medical  History:  Diagnosis Date  . ANEMIA   . Anxiety   . Asthma   . B12 DEFICIENCY   . Bipolar affective disorder ([place]) 1985   ?nervous breakdown  . BREAST CYST, LEFT   . Breast tumor   . GASTROESOPHAGEAL REFLUX DISEASE   . GOITER   . H/O cardiovascular stress test    Lex MV 4/14:  No ischemia, EF 72%, normal wall motion  . Hypertension   . Hyperthyroidism   . INTERSTITIAL LUNG DISEASE   . Syncope   . Thyroid disease    ? overactive thyroid    Family History  Problem Relation Age of Onset  . Cancer Father        ?type  . Hypertension Father   . Diabetes Mother   . Hypertension Mother     Past Surgical History:    Procedure Laterality Date  . ABDOMINAL HYSTERECTOMY  06/03/10  . BUNIONECTOMY     bilateral  . GALLBLADDER SURGERY  1985   stones removed  . HYDRADENITIS EXCISION Bilateral 05/20/2017   Procedure: EXCISION BILATERAL HIDRADENITIS AXILLA;  Surgeon: Erroll Luna, MD;  Location: [facility];  Service: General;  Laterality: Bilateral;  . KNEE SURGERY  1987   bilateral  . LUMBAR LAMINECTOMY/DECOMPRESSION MICRODISCECTOMY Right 01/13/2013   Procedure: MICRODISCECTOMY FORAMINOTOMY OF L4-5 ON THE RIGHT;  Surgeon: Magnus Sinning, MD;  [facility];  Service: Orthopedics;  Laterality: Right;  . MEDIASTINOSCOPY  2001  . STERNOTOMY  2001  . TONSILLECTOMY    . TUBAL LIGATION  1985, 1996   Social History   Occupational History    Comment: Disability  Tobacco Use  . Smoking status: Never Smoker  . Smokeless tobacco: Never Used  Substance and Sexual Activity  . Alcohol use: No  . Drug use: No  . Sexual activity: Yes    Birth control/protection: None

## 2018-09-23 ENCOUNTER — Encounter (HOSPITAL_COMMUNITY): Payer: Self-pay | Admitting: *Deleted

## 2018-09-23 ENCOUNTER — Emergency Department (HOSPITAL_COMMUNITY): Payer: Medicare Other

## 2018-09-23 ENCOUNTER — Other Ambulatory Visit: Payer: Self-pay

## 2018-09-23 ENCOUNTER — Emergency Department (HOSPITAL_COMMUNITY)
Admission: EM | Admit: 2018-09-23 | Discharge: 2018-09-23 | Disposition: A | Discharge status: Home / Self Care | Payer: Medicare Other | Attending: Emergency Medicine | Admitting: Emergency Medicine

## 2018-09-23 DIAGNOSIS — I10 Essential (primary) hypertension: Secondary | ICD-10-CM | POA: Insufficient documentation

## 2018-09-23 DIAGNOSIS — J45909 Unspecified asthma, uncomplicated: Secondary | ICD-10-CM | POA: Diagnosis not present

## 2018-09-23 DIAGNOSIS — Z79899 Other long term (current) drug therapy: Secondary | ICD-10-CM | POA: Insufficient documentation

## 2018-09-23 DIAGNOSIS — R0981 Nasal congestion: Secondary | ICD-10-CM | POA: Diagnosis present

## 2018-09-23 DIAGNOSIS — J329 Chronic sinusitis, unspecified: Secondary | ICD-10-CM | POA: Diagnosis not present

## 2018-09-23 DIAGNOSIS — Z91040 Latex allergy status: Secondary | ICD-10-CM | POA: Diagnosis not present

## 2018-09-23 LAB — CBC
HCT: 40.8 % (ref 36.0–46.0)
Hemoglobin: 12.1 g/dL (ref 12.0–15.0)
MCH: 26.6 pg (ref 26.0–34.0)
MCHC: 29.7 g/dL — ABNORMAL LOW (ref 30.0–36.0)
MCV: 89.7 fL (ref 80.0–100.0)
NRBC: 0 % (ref 0.0–0.2)
Platelets: 213 10*3/uL (ref 150–400)
RBC: 4.55 MIL/uL (ref 3.87–5.11)
RDW: 13.3 % (ref 11.5–15.5)
WBC: 10.1 10*3/uL (ref 4.0–10.5)

## 2018-09-23 LAB — BASIC METABOLIC PANEL
Anion gap: 6 (ref 5–15)
BUN: 6 mg/dL (ref 6–20)
CO2: 27 mmol/L (ref 22–32)
Calcium: 9.9 mg/dL (ref 8.9–10.3)
Chloride: 105 mmol/L (ref 98–111)
Creatinine, Ser: 0.97 mg/dL (ref 0.44–1.00)
GFR calc non Af Amer: >60 mL/min (ref >60)
GLUCOSE: 112 mg/dL — AB (ref 70–99)
Potassium: 4.6 mmol/L (ref 3.5–5.1)
Sodium: 138 mmol/L (ref 135–145)

## 2018-09-23 LAB — I-STAT TROPONIN, ED: Troponin i, poc: 0 ng/mL (ref 0.00–0.08)

## 2018-09-23 MED ORDER — AMOXICILLIN 500 MG PO CAPS
500.0000 mg | ORAL_CAPSULE | Freq: Once | ORAL | Status: AC
  Administered 2018-09-23: 500 mg via ORAL
  Filled 2018-09-23: qty 1

## 2018-09-23 MED ORDER — SODIUM CHLORIDE 0.9% FLUSH: 3.0000 mL | Freq: Once | INTRAVENOUS | Status: DC

## 2018-09-23 MED ORDER — FLUTICASONE PROPIONATE 50 MCG/ACT NA SUSP: 2.0000 | Freq: Every day | NASAL | 0 refills | Status: DC

## 2018-09-23 MED ORDER — AMOXICILLIN 500 MG PO CAPS: 500.0000 mg | ORAL_CAPSULE | Freq: Three times a day (TID) | ORAL | 0 refills | Status: DC

## 2018-09-23 NOTE — ED Triage Notes (Signed)
Pt c/o flu like symptoms since Sunday with congestion. Also reports chest discomfort. Has tried several OTC medications without relief.

## 2018-09-23 NOTE — ED Notes (Signed)
Patient transported to X-ray 

## 2018-09-23 NOTE — Discharge Instructions (Addendum)
Make sure you are drinking plenty of water every day.  Use Tylenol for pain or fever.  See your doctor for problems.

## 2018-09-23 NOTE — ED Provider Notes (Signed)
[facility] EMERGENCY DEPARTMENT Provider Note   CSN: [id number] Arrival date & time: 09/23/18  1953     History   Chief Complaint Chief Complaint  Patient presents with  . Chest Pain    HPI Lynn Gonzalez is a 58 y.o. female.  HPI   She presents for evaluation of concern for flu with congestion.  She complains of nasal congestion and sore throat for 5 days.  She is taking multiple over-the-counter medications, without relief.  She has a cough which is nonproductive.  She denies shortness of breath or chest pain.  She denies nausea, vomiting, weakness or dizziness.  She came here for evaluation, by private vehicle.  Past Medical History:  Diagnosis Date  . ANEMIA   . Anxiety   . Asthma   . B12 DEFICIENCY   . Bipolar affective disorder ([place]) 1985   ?nervous breakdown  . BREAST CYST, LEFT   . Breast tumor   . GASTROESOPHAGEAL REFLUX DISEASE   . GOITER   . H/O cardiovascular stress test    Lex MV 4/14:  No ischemia, EF 72%, normal wall motion  . Hypertension   . Hyperthyroidism   . INTERSTITIAL LUNG DISEASE   . Syncope   . Thyroid disease    ? overactive thyroid    Patient Active Problem List   Diagnosis Date Noted  . Sprain of anterior talofibular ligament of right ankle 04/29/2018  . Paresthesias 06/25/2016  . Headache 06/25/2016  . Hypokalemia 06/25/2016  . Prolonged Q-T interval on ECG 06/25/2016  . Thumb numbness 10/28/2013  . Hypertension 10/24/2013  . Hip pain 06/21/2013  . Low back pain radiating to right leg 05/04/2013  . Elevated blood pressure 10/12/2012  . Snoring 04/07/2012  . GERD (gastroesophageal reflux disease) 01/22/2012  . Gout 03/12/2011  . GOITER 10/21/2010  . BREAST CYST, LEFT 07/23/2010  . B12 DEFICIENCY 03/08/2010  . Bipolar 1 disorder ([place]) 03/08/2010  . HYPERTHYROIDISM 02/11/2010  . ANEMIA 02/11/2010  . INTERSTITIAL LUNG DISEASE 02/11/2010  . GASTROESOPHAGEAL REFLUX DISEASE 02/11/2010    Past Surgical  History:  Procedure Laterality Date  . ABDOMINAL HYSTERECTOMY  06/03/10  . BUNIONECTOMY     bilateral  . GALLBLADDER SURGERY  1985   stones removed  . HYDRADENITIS EXCISION Bilateral 05/20/2017   Procedure: EXCISION BILATERAL HIDRADENITIS AXILLA;  Surgeon: Erroll Luna, MD;  Location: [facility];  Service: General;  Laterality: Bilateral;  . KNEE SURGERY  1987   bilateral  . LUMBAR LAMINECTOMY/DECOMPRESSION MICRODISCECTOMY Right 01/13/2013   Procedure: MICRODISCECTOMY FORAMINOTOMY OF L4-5 ON THE RIGHT;  Surgeon: Magnus Sinning, MD;  Location: [facility];  Service: Orthopedics;  Laterality: Right;  . MEDIASTINOSCOPY  2001  . STERNOTOMY  2001  . TONSILLECTOMY    . [place]     OB History   No obstetric history on file.      Home Medications    Prior to Admission medications   Medication Sig Start Date End Date Taking? Authorizing Provider  albuterol (PROVENTIL HFA;VENTOLIN HFA) 108 (90 BASE) MCG/ACT inhaler Inhale 2 puffs into the lungs every 6 (six) hours as needed for wheezing or shortness of breath.     [provider]  amLODipine (NORVASC) 5 MG tablet take 1 tablet by mouth once daily 08/23/14   Debbrah Alar, NP  amoxicillin (AMOXIL) 500 MG capsule Take 1 capsule (500 mg total) by mouth 3 (three) times daily. 09/23/18   Daleen Bo, MD  Cholecalciferol (VITAMIN D-3)  5000 units TABS Take 1 tablet by mouth daily.    [provider]  estradiol (ESTRACE) 2 MG tablet take 1 tablet by mouth once daily 08/23/14   Debbrah Alar, NP  fluticasone (FLONASE) 50 MCG/ACT nasal spray Place 2 sprays into both nostrils daily. 09/23/18   Daleen Bo, MD  ibuprofen (ADVIL,MOTRIN) 800 MG tablet Take 1 tablet (800 mg total) by mouth every 8 (eight) hours as needed. 05/20/17   Erroll Luna, MD  lithium 300 MG tablet Take 300-600 mg by mouth 2 (two) times daily. Pt takes one tablet in the morning and two at bedtime.    [provider]    methylcellulose (CITRUCEL) oral powder Take 1 packet by mouth daily. 10/28/16   Dorie Rank, MD  oxyCODONE (OXY IR/ROXICODONE) 5 MG immediate release tablet Take 1-2 tablets (5-10 mg total) by mouth every 6 (six) hours as needed for severe pain. Patient not taking: Reported on 04/29/2018 05/20/17   Erroll Luna, MD  QUEtiapine (SEROQUEL) 300 MG tablet Take 300 mg by mouth at bedtime.    [provider]    Family History Family History  Problem Relation Age of Onset  . Cancer Father        ?type  . Hypertension Father   . Diabetes Mother   . Hypertension Mother     Social History Social History   Tobacco Use  . Smoking status: Never Smoker  . Smokeless tobacco: Never Used  Substance Use Topics  . Alcohol use: No  . Drug use: No     Allergies   Adhesive [tape] and Latex   Review of Systems Review of Systems  All other systems reviewed and are negative.    Physical Exam Updated Vital Signs BP (!) 145/97   Pulse 77   Temp 98.4 F (36.9 C) (Oral)   Resp 19   LMP 04/18/2010   SpO2 99%   Physical Exam Vitals signs and nursing note reviewed.  Constitutional:      General: She is not in acute distress.    Appearance: She is well-developed. She is obese. She is not ill-appearing, toxic-appearing or diaphoretic.  HENT:     Head: Normocephalic and atraumatic.     Right Ear: External ear normal.     Left Ear: External ear normal.  Eyes:     Conjunctiva/sclera: Conjunctivae normal.     Pupils: Pupils are equal, round, and reactive to light.  Neck:     Musculoskeletal: Normal range of motion and neck supple.     Trachea: Phonation normal.  Cardiovascular:     Rate and Rhythm: Normal rate and regular rhythm.     Heart sounds: Normal heart sounds.  Pulmonary:     Effort: Pulmonary effort is normal. No respiratory distress.     Breath sounds: Normal breath sounds. No stridor. No rhonchi.  Chest:     Chest wall: No tenderness.  Abdominal:     Palpations:  Abdomen is soft.     Tenderness: There is no abdominal tenderness.  Musculoskeletal: Normal range of motion.        General: No swelling or tenderness.  Skin:    General: Skin is warm and dry.  Neurological:     Mental Status: She is alert and oriented to person, place, and time.     Cranial Nerves: No cranial nerve deficit.     Sensory: No sensory deficit.     Motor: No abnormal muscle tone.     Coordination: Coordination normal.  Psychiatric:        Behavior: Behavior normal.        Thought Content: Thought content normal.        Judgment: Judgment normal.      ED Treatments / Results  Labs (all labs ordered are listed, but only abnormal results are displayed) Labs Reviewed  BASIC METABOLIC PANEL - Abnormal; Notable for the following components:      Result Value   Glucose, Bld 112 (*)    All other components within normal limits  CBC - Abnormal; Notable for the following components:   MCHC 29.7 (*)    All other components within normal limits  I-STAT TROPONIN, ED    EKG EKG Interpretation  Date/Time:  Thursday September 23 2018 20:18:36 EST Ventricular Rate:  88 PR Interval:  196 QRS Duration: 70 QT Interval:  370 QTC Calculation: 447 R Axis:   44 Text Interpretation:  Normal sinus rhythm Right atrial enlargement Septal infarct , age undetermined Abnormal ECG since last tracing no significant change Confirmed by Daleen Bo [phone]) on 09/23/2018 9:03:28 PM       Radiology Dg Chest 2 View  Result Date: 09/23/2018 CLINICAL DATA:  58 y/o  F; chest pain, sore throat, runny nose. EXAM: CHEST - 2 VIEW COMPARISON:  12/18/2015 chest radiograph FINDINGS: Mildly enlarged cardiac silhouette given projection and technique. Post median sternotomy with wires in alignment. No consolidation, effusion, or pneumothorax. Mild right apical pleuroparenchymal scarring is stable. Right upper quadrant surgical clips, presumably cholecystectomy. No acute osseous abnormality is evident.  IMPRESSION: No acute pulmonary process identified. Mildly enlarged cardiac silhouette. Electronically Signed   By: Kristine Garbe M.D.   On: 09/23/2018 20:58    Procedures Procedures (including critical care time)  Medications Ordered in ED Medications  sodium chloride flush (NS) 0.9 % injection 3 mL (3 mLs Intravenous Not Given 09/23/18 2110)  amoxicillin (AMOXIL) capsule 500 mg (500 mg Oral Given 09/23/18 2116)     Initial Impression / Assessment and Plan / ED Course  I have reviewed the triage vital signs and the nursing notes.  Pertinent labs & imaging results that were available during my care of the patient were reviewed by me and considered in my medical decision making (see chart for details).  Clinical Course as of Sep 23 2245  Thu Sep 23, 2018  2108 No infiltrate or CHF, images reviewed by me  DG Chest 2 View [EW]    Clinical Course User Index [EW] Daleen Bo, MD     Patient Vitals for the past 24 hrs:  BP Temp Temp src Pulse Resp SpO2  09/23/18 2115 (!) 145/97 - - 77 19 99 %  09/23/18 2100 (!) 139/98 - - 78 (!) 21 98 %  09/23/18 2000 (!) 147/97 98.4 F (36.9 C) Oral 88 14 97 %    9:30 PM Reevaluation with update and discussion. After initial assessment and treatment, an updated evaluation reveals no change in clinical status.  Patient is comfortable.  Findings discussed with the patient and all questions were answered. Daleen Bo   Medical Decision Making: Evaluation is consistent with nonspecific sinusitis.  Metabolic instability or severe bacterial infection.    CRITICAL CARE-no Performed by: Daleen Bo  Nursing Notes Reviewed/ Care Coordinated Applicable Imaging Reviewed Interpretation of Laboratory Data incorporated into ED treatment  The patient appears reasonably screened and/or stabilized for discharge and I doubt any other medical condition or other [facility] requiring further screening, evaluation, or treatment in the ED at  this time  prior to discharge.  Plan: Home Medications-continue usual medicines and use OTC analgesia of choice; Home Treatments-rest, fluids; return here if the recommended treatment, does not improve the symptoms; Recommended follow up-PCP follow-up as needed.   Final Clinical Impressions(s) / ED Diagnoses   Final diagnoses:  Sinusitis, unspecified chronicity, unspecified location    ED Discharge Orders         Ordered    amoxicillin (AMOXIL) 500 MG capsule  3 times daily     09/23/18 2130    fluticasone (FLONASE) 50 MCG/ACT nasal spray  Daily     09/23/18 2130           Daleen Bo, MD 09/23/18 2250

## 2018-12-14 DIAGNOSIS — K529 Noninfective gastroenteritis and colitis, unspecified: Secondary | ICD-10-CM | POA: Insufficient documentation

## 2019-01-21 ENCOUNTER — Telehealth: Payer: Self-pay | Admitting: Orthopedic Surgery

## 2019-01-21 NOTE — Telephone Encounter (Signed)
Received call from Caryl Pina w/ [employer] to check status of request sent in December. I advised we do not have their request. I gave our fax [phone] for her to re send

## 2019-06-06 ENCOUNTER — Other Ambulatory Visit: Payer: Self-pay | Admitting: Internal Medicine

## 2019-06-06 DIAGNOSIS — Z1231 Encounter for screening mammogram for malignant neoplasm of breast: Secondary | ICD-10-CM

## 2019-07-08 ENCOUNTER — Other Ambulatory Visit: Payer: Self-pay

## 2019-07-08 ENCOUNTER — Ambulatory Visit: Payer: Medicare Other | Admitting: Podiatry

## 2019-07-08 ENCOUNTER — Encounter: Payer: Self-pay | Admitting: Podiatry

## 2019-07-08 VITALS — BP 135/95 | HR 75 | Resp 16

## 2019-07-08 DIAGNOSIS — L600 Ingrowing nail: Secondary | ICD-10-CM

## 2019-07-08 DIAGNOSIS — M7751 Other enthesopathy of right foot: Secondary | ICD-10-CM | POA: Diagnosis not present

## 2019-07-08 DIAGNOSIS — L84 Corns and callosities: Secondary | ICD-10-CM

## 2019-07-08 DIAGNOSIS — M779 Enthesopathy, unspecified: Secondary | ICD-10-CM

## 2019-07-08 MED ORDER — NEOMYCIN-POLYMYXIN-HC 3.5-10000-1 OT SOLN: OTIC | 1 refills | Status: DC

## 2019-07-08 NOTE — Progress Notes (Signed)
   Subjective:    Patient ID: Lynn Gonzalez, female    DOB: [birth year], 58 y.o.   MRN: [id number]  HPI    Review of Systems  All other systems reviewed and are negative.      Objective:   Physical Exam        Assessment & Plan:

## 2019-07-08 NOTE — Patient Instructions (Signed)

## 2019-07-09 NOTE — Progress Notes (Signed)
Subjective:   Patient ID: Lynn Gonzalez, female   DOB: 58 y.o.   MRN: [id number]   HPI Patient presents stating that all nails are thickened and discolored on both feet and he cannot cut and he has inflammation on the bottom of the right foot with lesion formation that is painful and fluid buildup.  Patient does not smoke likes to be active   Review of Systems  All other systems reviewed and are negative.       Objective:  Physical Exam Vitals signs and nursing note reviewed.  Constitutional:      Appearance: She is well-developed.  Pulmonary:     Effort: Pulmonary effort is normal.  Musculoskeletal: Normal range of motion.  Skin:    General: Skin is warm.  Neurological:     Mental Status: She is alert.     Neurovascular status intact muscle strength was found to be adequate range of motion within normal limits.  Patient is found to have fluid buildup around the fifth MPJ head right with keratotic lesion formation that is painful and is noted to have severely thickened yellow brittle nailbeds 1-5 both feet that are impossible for him to cut     Assessment:  Inflammatory capsulitis fifth MPJ right lesion formation with associated bone pressure and nail disease 1-5 both feet     Plan:  P reviewed all conditions and today I did sterile prep and injected the fifth MPJ right 3 mg Kenalog 5 mg Xylocaine debrided lesion with sterile instrumentation and debrided nailbeds 1-5 both feet with no iatrogenic bleeding noted.  Patient will be seen back as needed

## 2019-07-26 ENCOUNTER — Ambulatory Visit: Payer: Medicare Other

## 2019-08-18 ENCOUNTER — Other Ambulatory Visit: Payer: Self-pay

## 2019-08-18 ENCOUNTER — Ambulatory Visit
Admission: RE | Admit: 2019-08-18 | Discharge: 2019-08-18 | Disposition: A | Discharge status: Home / Self Care | Payer: Medicare Other | Source: Ambulatory Visit | Attending: Internal Medicine | Admitting: Internal Medicine

## 2019-08-18 DIAGNOSIS — Z1231 Encounter for screening mammogram for malignant neoplasm of breast: Secondary | ICD-10-CM

## 2019-09-12 ENCOUNTER — Ambulatory Visit: Payer: Medicare Other | Attending: Internal Medicine

## 2019-09-12 DIAGNOSIS — Z20822 Contact with and (suspected) exposure to covid-19: Secondary | ICD-10-CM

## 2019-09-13 LAB — NOVEL CORONAVIRUS, NAA: SARS-CoV-2, NAA: NOT DETECTED

## 2020-01-18 ENCOUNTER — Encounter (HOSPITAL_COMMUNITY): Payer: Self-pay | Admitting: Emergency Medicine

## 2020-01-18 ENCOUNTER — Emergency Department (HOSPITAL_COMMUNITY)
Admission: EM | Admit: 2020-01-18 | Discharge: 2020-01-18 | Disposition: A | Discharge status: Home / Self Care | Payer: Medicare Other | Attending: Emergency Medicine | Admitting: Emergency Medicine

## 2020-01-18 ENCOUNTER — Other Ambulatory Visit: Payer: Self-pay

## 2020-01-18 DIAGNOSIS — Y929 Unspecified place or not applicable: Secondary | ICD-10-CM | POA: Insufficient documentation

## 2020-01-18 DIAGNOSIS — S90561A Insect bite (nonvenomous), right ankle, initial encounter: Secondary | ICD-10-CM

## 2020-01-18 DIAGNOSIS — Y939 Activity, unspecified: Secondary | ICD-10-CM | POA: Diagnosis not present

## 2020-01-18 DIAGNOSIS — I10 Essential (primary) hypertension: Secondary | ICD-10-CM | POA: Insufficient documentation

## 2020-01-18 DIAGNOSIS — Z79899 Other long term (current) drug therapy: Secondary | ICD-10-CM | POA: Insufficient documentation

## 2020-01-18 DIAGNOSIS — J45909 Unspecified asthma, uncomplicated: Secondary | ICD-10-CM | POA: Insufficient documentation

## 2020-01-18 DIAGNOSIS — W57XXXA Bitten or stung by nonvenomous insect and other nonvenomous arthropods, initial encounter: Secondary | ICD-10-CM

## 2020-01-18 DIAGNOSIS — Y999 Unspecified external cause status: Secondary | ICD-10-CM | POA: Diagnosis not present

## 2020-01-18 DIAGNOSIS — Z91040 Latex allergy status: Secondary | ICD-10-CM | POA: Insufficient documentation

## 2020-01-18 DIAGNOSIS — S90562A Insect bite (nonvenomous), left ankle, initial encounter: Secondary | ICD-10-CM | POA: Insufficient documentation

## 2020-01-18 MED ORDER — CEPHALEXIN 500 MG PO CAPS: 500.0000 mg | ORAL_CAPSULE | Freq: Four times a day (QID) | ORAL | 0 refills | Status: DC

## 2020-01-18 NOTE — ED Provider Notes (Signed)
[facility] EMERGENCY DEPARTMENT Provider Note   CSN: [id number] Arrival date & time: 01/18/20  1644     History Chief Complaint  Patient presents with  . Insect Bite    Lynn Gonzalez is a 59 y.o. female.  The history is provided by the patient. No language interpreter was used.       59 year old female with history of bipolar presenting complaining of concerns of insect bite.  Patient states yesterday she was standing in the grass while wearing her shoes when she felt something bit her on her right ankle.  She went down to smacked it and then noticed several bugs on her left ankle that also has bitten her.  She swatted away and now she is complaining of itchiness and pain to both ankles left greater than right.  Symptoms moderate in severity, pain is described as a throbbing sensation, no associated fever.  Patient without any other complaint.  Denies any tick exposure.  Past Medical History:  Diagnosis Date  . ANEMIA   . Anxiety   . Asthma   . B12 DEFICIENCY   . Bipolar affective disorder ([place]) 1985   ?nervous breakdown  . BREAST CYST, LEFT   . Breast tumor   . GASTROESOPHAGEAL REFLUX DISEASE   . GOITER   . H/O cardiovascular stress test    Lex MV 4/14:  No ischemia, EF 72%, normal wall motion  . Hypertension   . Hyperthyroidism   . INTERSTITIAL LUNG DISEASE   . Syncope   . Thyroid disease    ? overactive thyroid    Patient Active Problem List   Diagnosis Date Noted  . Chronic diarrhea 12/14/2018  . Sprain of anterior talofibular ligament of right ankle 04/29/2018  . Paresthesias 06/25/2016  . Headache 06/25/2016  . Hypokalemia 06/25/2016  . Prolonged Q-T interval on ECG 06/25/2016  . Thumb numbness 10/28/2013  . Hypertension 10/24/2013  . Hip pain 06/21/2013  . Low back pain radiating to right leg 05/04/2013  . Elevated blood pressure 10/12/2012  . Snoring 04/07/2012  . GERD (gastroesophageal reflux disease) 01/22/2012  . Gout  03/12/2011  . GOITER 10/21/2010  . BREAST CYST, LEFT 07/23/2010  . B12 DEFICIENCY 03/08/2010  . Bipolar 1 disorder ([place]) 03/08/2010  . HYPERTHYROIDISM 02/11/2010  . ANEMIA 02/11/2010  . INTERSTITIAL LUNG DISEASE 02/11/2010  . GASTROESOPHAGEAL REFLUX DISEASE 02/11/2010    Past Surgical History:  Procedure Laterality Date  . ABDOMINAL HYSTERECTOMY  06/03/10  . BUNIONECTOMY     bilateral  . GALLBLADDER SURGERY  1985   stones removed  . HYDRADENITIS EXCISION Bilateral 05/20/2017   Procedure: EXCISION BILATERAL HIDRADENITIS AXILLA;  Surgeon: Erroll Luna, MD;  Location: [facility];  Service: General;  Laterality: Bilateral;  . KNEE SURGERY  1987   bilateral  . LUMBAR LAMINECTOMY/DECOMPRESSION MICRODISCECTOMY Right 01/13/2013   Procedure: MICRODISCECTOMY FORAMINOTOMY OF L4-5 ON THE RIGHT;  Surgeon: Magnus Sinning, MD;  Location: [facility];  Service: Orthopedics;  Laterality: Right;  . MEDIASTINOSCOPY  2001  . STERNOTOMY  2001  . TONSILLECTOMY    . [place]     OB History   No obstetric history on file.     Family History  Problem Relation Age of Onset  . Cancer Father        ?type  . Hypertension Father   . Diabetes Mother   . Hypertension Mother     Social History   Tobacco Use  . Smoking status: Never  Smoker  . Smokeless tobacco: Never Used  Substance Use Topics  . Alcohol use: No  . Drug use: No    Home Medications Prior to Admission medications   Medication Sig Start Date End Date Taking? Authorizing Provider  amLODipine (NORVASC) 5 MG tablet take 1 tablet by mouth once daily 08/23/14   Debbrah Alar, NP  Cholecalciferol (VITAMIN D-3) 5000 units TABS Take 1 tablet by mouth daily.    [provider]  estradiol (ESTRACE) 2 MG tablet take 1 tablet by mouth once daily 08/23/14   Debbrah Alar, NP  lithium 300 MG tablet Take 300-600 mg by mouth 2 (two) times daily. Pt takes one tablet in the morning and two at bedtime.    [provider]  neomycin-polymyxin-hydrocortisone (CORTISPORIN) OTIC solution Apply 1-2 drops to toe after soaking BID 07/08/19   Wallene Huh, DPM  QUEtiapine (SEROQUEL) 300 MG tablet Take 300 mg by mouth at bedtime.    [provider]    Allergies    Adhesive [tape] and Latex  Review of Systems   Review of Systems  Constitutional: Negative for fever.  Skin: Negative for rash and wound.  Neurological: Negative for numbness.    Physical Exam Updated Vital Signs BP (!) 133/95 (BP Location: Left Arm)   Pulse 79   Temp 98.5 F (36.9 C) (Oral)   Resp 16   LMP 04/18/2010   SpO2 97%   Physical Exam Vitals and nursing note reviewed.  Constitutional:      General: She is not in acute distress.    Appearance: She is well-developed.  HENT:     Head: Atraumatic.  Eyes:     Conjunctiva/sclera: Conjunctivae normal.  Musculoskeletal:     Cervical back: Neck supple.  Skin:    Capillary Refill: Capillary refill takes less than 2 seconds.     Findings: No rash.     Comments: Visual inspections of the bilateral ankles reveals 2 small localized skin irritation to the anterior ankles with excoriation marks but no significant erythema or warmth appreciated.  Ankle with full range of motion.  Dorsalis pedis pulse palpable.  Neurological:     Mental Status: She is alert.  Psychiatric:        Mood and Affect: Mood normal.     ED Results / Procedures / Treatments   Labs (all labs ordered are listed, but only abnormal results are displayed) Labs Reviewed - No data to display  EKG None  Radiology No results found.  Procedures Procedures (including critical care time)  Medications Ordered in ED Medications - No data to display  ED Course  I have reviewed the triage vital signs and the nursing notes.  Pertinent labs & imaging results that were available during my care of the patient were reviewed by me and considered in my medical decision making (see chart for  details).    MDM Rules/Calculators/A&P                      BP (!) 133/95 (BP Location: Left Arm)   Pulse 79   Temp 98.5 F (36.9 C) (Oral)   Resp 16   LMP 04/18/2010   SpO2 97%   Final Clinical Impression(s) / ED Diagnoses Final diagnoses:  Insect bite of left ankle, initial encounter  Insect bite of right ankle, initial encounter    Rx / DC Orders ED Discharge Orders    None     5:59 PM Patient report being  bitten by bugs to both ankles while standing in the grass yesterday.  No tick exposure.  She is complaining of pain and itchiness.  She does have some localized skin irritation but no signs of cellulitis.  Reassurance given.  However, will prescribe antibiotic and recommend patient only to use it when she noticed signs of infection which include redness and red streaking with worsening pain.  Patient voiced understanding.   Domenic Moras, PA-C 01/18/20 1805    Blanchie Dessert, MD 01/19/20 2046

## 2020-01-18 NOTE — Discharge Instructions (Signed)
You have been evaluated for your insect bite.  At this time there are no evidence of skin infection.  However if you notice increasing pain redness or red streak extending up your legs then please take antibiotic as prescribed.  You may take over-the-counter Benadryl as needed for itchiness.

## 2020-01-18 NOTE — ED Triage Notes (Signed)
Pt states about 430pm she was outside in some grass and looked down and seen some type of bug and it bite her on her left ankle, pt states she thinks it bit her on her right foot too because she has a bug bite there too. Pts left ankle is red and swollen around ankle.

## 2020-02-01 ENCOUNTER — Emergency Department (HOSPITAL_COMMUNITY): Payer: Medicare Other

## 2020-02-01 ENCOUNTER — Other Ambulatory Visit: Payer: Self-pay

## 2020-02-01 ENCOUNTER — Encounter (HOSPITAL_COMMUNITY): Payer: Self-pay | Admitting: Emergency Medicine

## 2020-02-01 ENCOUNTER — Emergency Department (HOSPITAL_BASED_OUTPATIENT_CLINIC_OR_DEPARTMENT_OTHER)
Admit: 2020-02-01 | Discharge: 2020-02-01 | Disposition: A | Discharge status: Home / Self Care | Payer: Medicare Other | Attending: Emergency Medicine | Admitting: Emergency Medicine

## 2020-02-01 ENCOUNTER — Emergency Department (HOSPITAL_COMMUNITY)
Admission: EM | Admit: 2020-02-01 | Discharge: 2020-02-01 | Disposition: A | Discharge status: Home / Self Care | Payer: Medicare Other | Attending: Emergency Medicine | Admitting: Emergency Medicine

## 2020-02-01 DIAGNOSIS — I10 Essential (primary) hypertension: Secondary | ICD-10-CM | POA: Diagnosis not present

## 2020-02-01 DIAGNOSIS — M79605 Pain in left leg: Secondary | ICD-10-CM | POA: Diagnosis present

## 2020-02-01 DIAGNOSIS — M79609 Pain in unspecified limb: Secondary | ICD-10-CM

## 2020-02-01 DIAGNOSIS — J45909 Unspecified asthma, uncomplicated: Secondary | ICD-10-CM | POA: Insufficient documentation

## 2020-02-01 DIAGNOSIS — M25572 Pain in left ankle and joints of left foot: Secondary | ICD-10-CM | POA: Insufficient documentation

## 2020-02-01 DIAGNOSIS — Z91040 Latex allergy status: Secondary | ICD-10-CM | POA: Diagnosis not present

## 2020-02-01 DIAGNOSIS — Z79899 Other long term (current) drug therapy: Secondary | ICD-10-CM | POA: Diagnosis not present

## 2020-02-01 NOTE — ED Notes (Signed)
Patient verbalizes understanding of discharge instructions. Opportunity for questioning and answers were provided. Armband removed by staff, pt discharged from ED ambulatory.   

## 2020-02-01 NOTE — Progress Notes (Signed)
Left lower extremity venous duplex completed. Refer to "CV Proc" under chart review to view preliminary results.  02/01/2020 11:12 AM Kelby Aline., MHA, RVT, RDCS, RDMS

## 2020-02-01 NOTE — ED Provider Notes (Signed)
[facility] EMERGENCY DEPARTMENT Provider Note   CSN: [id number] Arrival date & time: 02/01/20  3220     History Chief Complaint  Patient presents with  . Leg Swelling  . Insect Bite    Lynn Gonzalez is a 59 y.o. female with history of GERD, interstitial lung disease, hypertension, hyperthyroidism, anemia, asthma presenting for evaluation of acute onset, persistent left lower extremity pain for 2 weeks.  She reports that she was seen 2 weeks ago after she thought that she was bitten by an insect while standing on grass and was discharged with a course of Keflex.  She reports that she took the antibiotic without resolution of her symptoms.  She feels that her left lower extremity is swollen along the ankle and lower leg.  She complains of soreness along the lateral malleolus and medial lower leg.  Pain worsens with ambulation.  She reports intermittent numbness and tingling of her left foot.  She denies fevers, recent travel or surgeries, hemoptysis, shortness of breath, chest pain, or prior history of DVT or PE.  She reports that she has been taking Benadryl and soaking in Epsom salts with little relief.  The history is provided by the patient.       Past Medical History:  Diagnosis Date  . ANEMIA   . Anxiety   . Asthma   . B12 DEFICIENCY   . Bipolar affective disorder ([place]) 1985   ?nervous breakdown  . BREAST CYST, LEFT   . Breast tumor   . GASTROESOPHAGEAL REFLUX DISEASE   . GOITER   . H/O cardiovascular stress test    Lex MV 4/14:  No ischemia, EF 72%, normal wall motion  . Hypertension   . Hyperthyroidism   . INTERSTITIAL LUNG DISEASE   . Syncope   . Thyroid disease    ? overactive thyroid    Patient Active Problem List   Diagnosis Date Noted  . Chronic diarrhea 12/14/2018  . Sprain of anterior talofibular ligament of right ankle 04/29/2018  . Paresthesias 06/25/2016  . Headache 06/25/2016  . Hypokalemia 06/25/2016  . Prolonged Q-T  interval on ECG 06/25/2016  . Thumb numbness 10/28/2013  . Hypertension 10/24/2013  . Hip pain 06/21/2013  . Low back pain radiating to right leg 05/04/2013  . Elevated blood pressure 10/12/2012  . Snoring 04/07/2012  . GERD (gastroesophageal reflux disease) 01/22/2012  . Gout 03/12/2011  . GOITER 10/21/2010  . BREAST CYST, LEFT 07/23/2010  . B12 DEFICIENCY 03/08/2010  . Bipolar 1 disorder ([place]) 03/08/2010  . HYPERTHYROIDISM 02/11/2010  . ANEMIA 02/11/2010  . INTERSTITIAL LUNG DISEASE 02/11/2010  . GASTROESOPHAGEAL REFLUX DISEASE 02/11/2010    Past Surgical History:  Procedure Laterality Date  . ABDOMINAL HYSTERECTOMY  06/03/10  . BUNIONECTOMY     bilateral  . GALLBLADDER SURGERY  1985   stones removed  . HYDRADENITIS EXCISION Bilateral 05/20/2017   Procedure: EXCISION BILATERAL HIDRADENITIS AXILLA;  Surgeon: Erroll Luna, MD;  Location: [place];  Service: General;  Laterality: Bilateral;  . KNEE SURGERY  1987   bilateral  . LUMBAR LAMINECTOMY/DECOMPRESSION MICRODISCECTOMY Right 01/13/2013   Procedure: MICRODISCECTOMY FORAMINOTOMY OF L4-5 ON THE RIGHT;  Surgeon: Magnus Sinning, MD;  Location: [facility];  Service: Orthopedics;  Laterality: Right;  . MEDIASTINOSCOPY  2001  . STERNOTOMY  2001  . TONSILLECTOMY    . [place]     OB History   No obstetric history on file.     Family History  Problem Relation Age of Onset  . Cancer Father        ?type  . Hypertension Father   . Diabetes Mother   . Hypertension Mother     Social History   Tobacco Use  . Smoking status: Never Smoker  . Smokeless tobacco: Never Used  Substance Use Topics  . Alcohol use: No  . Drug use: No    Home Medications Prior to Admission medications   Medication Sig Start Date End Date Taking? Authorizing Provider  albuterol (VENTOLIN HFA) 108 (90 Base) MCG/ACT inhaler Inhale 1-2 puffs into the lungs every 6 (six) hours as needed for shortness of breath. 10/01/19  Yes  [provider]  amLODipine (NORVASC) 5 MG tablet take 1 tablet by mouth once daily Patient taking differently: Take 5 mg by mouth at bedtime.  08/23/14  Yes Debbrah Alar, NP  cephALEXin (KEFLEX) 500 MG capsule Take 1 capsule (500 mg total) by mouth 4 (four) times daily. 01/18/20  Yes Domenic Moras, PA-C  Cholecalciferol (VITAMIN D-3) 5000 units TABS Take 1 tablet by mouth daily.   Yes [provider]  estradiol (ESTRACE) 2 MG tablet take 1 tablet by mouth once daily Patient taking differently: Take 2 mg by mouth daily.  08/23/14  Yes Debbrah Alar, NP  lithium 300 MG tablet Take 300-600 mg by mouth See admin instructions. Take 300mg  in the morning and 600mg  at bedtime.   Yes [provider]  QUEtiapine (SEROQUEL) 300 MG tablet Take 300 mg by mouth at bedtime.   Yes [provider]  potassium chloride SA (KLOR-CON) 20 MEQ tablet Take 20 mEq by mouth daily. 01/27/20   [provider]    Allergies    Adhesive [tape] and Latex  Review of Systems   Review of Systems  Respiratory: Negative for shortness of breath.   Cardiovascular: Positive for leg swelling. Negative for chest pain.  Musculoskeletal: Positive for arthralgias.  Neurological: Positive for numbness. Negative for weakness.    Physical Exam Updated Vital Signs BP (!) 118/95 (BP Location: Right Arm)   Pulse 78   Temp 98.3 F (36.8 C) (Oral)   Resp 16   LMP 04/18/2010   SpO2 99%   Physical Exam Vitals and nursing note reviewed.  Constitutional:      General: She is not in acute distress.    Appearance: She is well-developed.     Comments: Resting comfortably in bed  HENT:     Head: Normocephalic and atraumatic.  Eyes:     General:        Right eye: No discharge.        Left eye: No discharge.     Conjunctiva/sclera: Conjunctivae normal.  Neck:     Vascular: No JVD.     Trachea: No tracheal deviation.  Cardiovascular:     Rate and Rhythm: Normal rate.     Pulses:  Normal pulses.     Comments: 2+ DP/PT pulses bilaterally, Homans' sign absent bilaterally.  No lower extremity edema noted. Pulmonary:     Effort: Pulmonary effort is normal.  Abdominal:     General: There is no distension.  Musculoskeletal:        General: Tenderness present.     Comments: Tenderness to palpation along the left lateral malleolus and medial left lower leg.  No ligamentous laxity or varus or valgus instability noted.  No crepitus.  5/5 strength of bilateral lower extremities with plantarflexion, dorsiflexion, inversion and eversion.  Well-healed surgical scar along  the dorsum of the left first metatarsal from prior bunionectomy.  Skin:    General: Skin is warm and dry.     Findings: No erythema.  Neurological:     Mental Status: She is alert.     Comments: Altered sensation to light touch of the left foot along the plantar aspect.  Otherwise sensation intact to light touch of bilateral lower extremities  Psychiatric:        Behavior: Behavior normal.     ED Results / Procedures / Treatments   Labs (all labs ordered are listed, but only abnormal results are displayed) Labs Reviewed - No data to display  EKG None  Radiology DG Ankle Complete Left  Result Date: 02/01/2020 CLINICAL DATA:  Pt states she was bitten 2 weeks ago in lateral side of ankle, swelling and pain on medial side of ankle radiating to leg No previous injuries. EXAM: LEFT ANKLE COMPLETE - 3+ VIEW COMPARISON:  None. FINDINGS: There is no evidence of fracture, dislocation, or joint effusion. There is no evidence of arthropathy or other focal bone abnormality. Soft tissues are unremarkable. IMPRESSION: Negative radiographs of the left ankle. Electronically Signed   By: Audie Pinto M.D.   On: 02/01/2020 09:53   VAS [place] LOWER EXTREMITY VENOUS (DVT) ([facility] 7a-7p)  Result Date: 02/01/2020  Lower Venous DVTStudy Indications: Pain, and bug bite two weeks ago.  Limitations: Guarding secondary to pain.  Comparison Study: No prior study Performing Technologist: Maudry Mayhew MHA, RDMS, RVT, RDCS  Examination Guidelines: A complete evaluation includes B-mode imaging, spectral Doppler, color Doppler, and power Doppler as needed of all accessible portions of each vessel. Bilateral testing is considered an integral part of a complete examination. Limited examinations for reoccurring indications may be performed as noted. The reflux portion of the exam is performed with the patient in reverse Trendelenburg.  Right Technical Findings: Not visualized segments include CFV.  +---------+---------------+---------+-----------+----------+--------------+ LEFT     CompressibilityPhasicitySpontaneityPropertiesThrombus Aging +---------+---------------+---------+-----------+----------+--------------+ CFV      Full           Yes      Yes                                 +---------+---------------+---------+-----------+----------+--------------+ SFJ      Full                                                        +---------+---------------+---------+-----------+----------+--------------+ FV Prox  Full                                                        +---------+---------------+---------+-----------+----------+--------------+ FV Mid   Full                                                        +---------+---------------+---------+-----------+----------+--------------+ FV DistalFull                                                        +---------+---------------+---------+-----------+----------+--------------+  PFV      Full                                                        +---------+---------------+---------+-----------+----------+--------------+ POP      Full           Yes      Yes                                 +---------+---------------+---------+-----------+----------+--------------+ PTV      Full                                                         +---------+---------------+---------+-----------+----------+--------------+ PERO     Full                                                        +---------+---------------+---------+-----------+----------+--------------+     Summary: LEFT: - There is no evidence of deep vein thrombosis in the lower extremity.  - No cystic structure found in the popliteal fossa.  *See table(s) above for measurements and observations.    Preliminary     Procedures Procedures (including critical care time)  Medications Ordered in ED Medications - No data to display  ED Course  I have reviewed the triage vital signs and the nursing notes.  Pertinent labs & imaging results that were available during my care of the patient were reviewed by me and considered in my medical decision making (see chart for details).    MDM Rules/Calculators/A&P                          Patient presenting for evaluation of persistent left ankle pain.  She is afebrile, vital signs are stable.  She is nontoxic in appearance.  She is neurovascularly intact.  Physical examination is reassuring with soft compartments, no evidence of secondary skin infection.  No concern for septic arthritis.  Radiographs show no acute osseous abnormality and DVT study is negative.  No evidence of abscess or persistent insect bite.  Will discharge home with ASO ankle brace and she has crutches.  Recommend follow-up with PCP or orthopedist on an outpatient basis.  Discussed strict ED return precautions. Patient verbalized understanding of and agreement with plan and is safe for discharge home at this time.     Final Clinical Impression(s) / ED Diagnoses Final diagnoses:  Acute left ankle pain    Rx / DC Orders ED Discharge Orders    None       Renita Papa, PA-C 02/01/20 1203    Fredia Sorrow, MD 02/02/20 [phone]

## 2020-02-01 NOTE — ED Notes (Signed)
Patient transported to vas ultrasound

## 2020-02-01 NOTE — Progress Notes (Signed)
Attempted lower extremity venous duplex, however patient cannot be located.   02/01/2020 9:31 AM Kelby Aline., MHA, RVT, RDCS, RDMS

## 2020-02-01 NOTE — ED Triage Notes (Signed)
Pt. Stated, I was bit by something 2 weeks ago and given some medicine and its still there and my legs are swollen

## 2020-02-01 NOTE — Discharge Instructions (Addendum)
Your work-up today did not show any evidence of blood clot in the leg, fracture on x-ray, or skin infection.  1. Medications: Alternate 600 mg of ibuprofen and [phone] mg of Tylenol every 3 hours as needed for pain. Do not exceed 4000 mg of Tylenol daily.  Take ibuprofen with food to avoid upset stomach issues.  2. Treatment: rest, apply ice or heat whichever feels best 20 minutes at a time a few times daily, elevate and use brace, drink plenty of fluids, gentle stretching 3. Follow Up: Please followup with orthopedics as directed or your PCP in 1 week if no improvement for discussion of your diagnoses and further evaluation after today's visit; if you do not have a primary care doctor use the resource guide provided to find one; Please return to the ER for worsening symptoms or other concerns such as worsening swelling, redness of the skin, fevers, loss of pulses, or loss of feeling

## 2020-03-01 ENCOUNTER — Other Ambulatory Visit: Payer: Self-pay | Admitting: Student

## 2020-03-01 DIAGNOSIS — K589 Irritable bowel syndrome without diarrhea: Secondary | ICD-10-CM

## 2020-03-08 ENCOUNTER — Other Ambulatory Visit (HOSPITAL_COMMUNITY): Payer: Self-pay | Admitting: Student

## 2020-03-08 DIAGNOSIS — I10 Essential (primary) hypertension: Secondary | ICD-10-CM

## 2020-03-15 ENCOUNTER — Other Ambulatory Visit: Payer: Medicare Other

## 2020-03-22 ENCOUNTER — Other Ambulatory Visit (HOSPITAL_COMMUNITY): Payer: Medicare Other

## 2020-04-20 ENCOUNTER — Other Ambulatory Visit (HOSPITAL_COMMUNITY): Payer: Medicare Other

## 2020-05-14 ENCOUNTER — Encounter: Payer: Self-pay | Admitting: Internal Medicine

## 2020-05-15 ENCOUNTER — Other Ambulatory Visit: Payer: Self-pay

## 2020-05-15 ENCOUNTER — Encounter (HOSPITAL_COMMUNITY): Payer: Self-pay | Admitting: Emergency Medicine

## 2020-05-15 ENCOUNTER — Ambulatory Visit (HOSPITAL_COMMUNITY)
Admission: EM | Admit: 2020-05-15 | Discharge: 2020-05-15 | Disposition: A | Discharge status: Home / Self Care | Payer: Medicare Other | Attending: Urgent Care | Admitting: Urgent Care

## 2020-05-15 DIAGNOSIS — Z7901 Long term (current) use of anticoagulants: Secondary | ICD-10-CM | POA: Insufficient documentation

## 2020-05-15 DIAGNOSIS — Z79899 Other long term (current) drug therapy: Secondary | ICD-10-CM | POA: Diagnosis not present

## 2020-05-15 DIAGNOSIS — I10 Essential (primary) hypertension: Secondary | ICD-10-CM | POA: Insufficient documentation

## 2020-05-15 DIAGNOSIS — Z20822 Contact with and (suspected) exposure to covid-19: Secondary | ICD-10-CM | POA: Insufficient documentation

## 2020-05-15 DIAGNOSIS — F419 Anxiety disorder, unspecified: Secondary | ICD-10-CM | POA: Diagnosis not present

## 2020-05-15 DIAGNOSIS — J45909 Unspecified asthma, uncomplicated: Secondary | ICD-10-CM | POA: Insufficient documentation

## 2020-05-15 DIAGNOSIS — M109 Gout, unspecified: Secondary | ICD-10-CM | POA: Diagnosis not present

## 2020-05-15 DIAGNOSIS — F319 Bipolar disorder, unspecified: Secondary | ICD-10-CM | POA: Diagnosis not present

## 2020-05-15 DIAGNOSIS — J849 Interstitial pulmonary disease, unspecified: Secondary | ICD-10-CM | POA: Insufficient documentation

## 2020-05-15 DIAGNOSIS — W57XXXA Bitten or stung by nonvenomous insect and other nonvenomous arthropods, initial encounter: Secondary | ICD-10-CM | POA: Diagnosis not present

## 2020-05-15 DIAGNOSIS — M79672 Pain in left foot: Secondary | ICD-10-CM | POA: Insufficient documentation

## 2020-05-15 DIAGNOSIS — M7989 Other specified soft tissue disorders: Secondary | ICD-10-CM | POA: Diagnosis not present

## 2020-05-15 DIAGNOSIS — E0590 Thyrotoxicosis, unspecified without thyrotoxic crisis or storm: Secondary | ICD-10-CM | POA: Insufficient documentation

## 2020-05-15 DIAGNOSIS — S90862A Insect bite (nonvenomous), left foot, initial encounter: Secondary | ICD-10-CM | POA: Insufficient documentation

## 2020-05-15 MED ORDER — TRIAMCINOLONE ACETONIDE 0.1 % EX CREA: 1.0000 "application " | TOPICAL_CREAM | Freq: Two times a day (BID) | CUTANEOUS | 0 refills | Status: DC

## 2020-05-15 MED ORDER — CETIRIZINE HCL 10 MG PO TABS: 10.0000 mg | ORAL_TABLET | Freq: Every day | ORAL | 0 refills | Status: DC

## 2020-05-15 MED ORDER — METHYLPREDNISOLONE SODIUM SUCC 125 MG IJ SOLR
INTRAMUSCULAR | Status: AC
  Filled 2020-05-15: qty 2

## 2020-05-15 MED ORDER — METHYLPREDNISOLONE SODIUM SUCC 125 MG IJ SOLR
80.0000 mg | Freq: Once | INTRAMUSCULAR | Status: AC
  Administered 2020-05-15: 80 mg via INTRAMUSCULAR

## 2020-05-15 NOTE — Discharge Instructions (Addendum)
Medication as prescribed Follow up as needed for continued or worsening symptoms

## 2020-05-15 NOTE — ED Triage Notes (Signed)
Pt presents with insect bite on left leg xs 1 day. States painful and swelling.   Pt would like covid test after exposure this weekend. Denies symptoms at this time.

## 2020-05-16 LAB — SARS CORONAVIRUS 2 (TAT 6-24 HRS): SARS Coronavirus 2: NEGATIVE

## 2020-05-16 NOTE — ED Provider Notes (Signed)
[place]    CSN: [id number] Arrival date & time: 05/15/20  1331      History   Chief Complaint Chief Complaint  Patient presents with  . Insect Bite    HPI Lynn Gonzalez is a 59 y.o. female.   Patient is a 59 year old female presents today with insect bite to left foot.  This occurred yesterday.  Unsure of what may have been her.  The area is very itchy and has moderate swelling.  Reports the swelling has worsened since yesterday.  She has been doing ice packs to the area.  Patient would also like to be tested for Covid.  She denies any Covid type symptoms but it was exposed this weekend.     Past Medical History:  Diagnosis Date  . ANEMIA   . Anxiety   . Asthma   . B12 DEFICIENCY   . Bipolar 1 disorder ([place]) 1985  . BREAST CYST, LEFT   . Breast tumor   . GERD (gastroesophageal reflux disease)   . GOITER   . H/O cardiovascular stress test    Lex MV 4/14:  No ischemia, EF 72%, normal wall motion  . Hypertension   . Hyperthyroidism   . INTERSTITIAL LUNG DISEASE   . Irritable bowel syndrome with diarrhea     Patient Active Problem List   Diagnosis Date Noted  . Chronic diarrhea 12/14/2018  . Paresthesias 06/25/2016  . Prolonged Q-T interval on ECG 06/25/2016  . Hypertension 10/24/2013  . Hip pain 06/21/2013  . Snoring 04/07/2012  . GERD (gastroesophageal reflux disease) 01/22/2012  . Gout 03/12/2011  . GOITER 10/21/2010  . BREAST CYST, LEFT 07/23/2010  . B12 DEFICIENCY 03/08/2010  . Bipolar 1 disorder ([place]) 03/08/2010  . HYPERTHYROIDISM 02/11/2010  . ANEMIA 02/11/2010    Past Surgical History:  Procedure Laterality Date  . ABDOMINAL HYSTERECTOMY  06/03/10  . BUNIONECTOMY     bilateral  . GALLBLADDER SURGERY  1985   stones removed  . HYDRADENITIS EXCISION Bilateral 05/20/2017   Procedure: EXCISION BILATERAL HIDRADENITIS AXILLA;  Surgeon: Erroll Luna, MD;  Location: [place];  Service: General;  Laterality: Bilateral;  . KNEE SURGERY   1987   bilateral  . LUMBAR LAMINECTOMY/DECOMPRESSION MICRODISCECTOMY Right 01/13/2013   Procedure: MICRODISCECTOMY FORAMINOTOMY OF L4-5 ON THE RIGHT;  Surgeon: Magnus Sinning, MD;  Location[facility];  Service: Orthopedics;  Laterality: Right;  . MEDIASTINOSCOPY  2001  . STERNOTOMY  2001  . TONSILLECTOMY    . [place]    OB History   No obstetric history on file.      Home Medications    Prior to Admission medications   Medication Sig Start Date End Date Taking? Authorizing Provider  albuterol (VENTOLIN HFA) 108 (90 Base) MCG/ACT inhaler Inhale 1-2 puffs into the lungs every 6 (six) hours as needed for shortness of breath. 10/01/19   [provider]  amLODipine (NORVASC) 5 MG tablet take 1 tablet by mouth once daily Patient taking differently: Take 5 mg by mouth at bedtime.  08/23/14   Debbrah Alar, NP  cetirizine (ZYRTEC) 10 MG tablet Take 1 tablet (10 mg total) by mouth daily. 05/15/20   Loura Halt A, NP  Cholecalciferol (VITAMIN D-3) 5000 units TABS Take 1 tablet by mouth daily.    [provider]  estradiol (ESTRACE) 2 MG tablet take 1 tablet by mouth once daily Patient taking differently: Take 2 mg by mouth daily.  08/23/14   Debbrah Alar,  NP  lithium 300 MG tablet Take 300-600 mg by mouth See admin instructions. Take 300mg  in the morning and 600mg  at bedtime.    [provider]  potassium chloride SA (KLOR-CON) 20 MEQ tablet Take 20 mEq by mouth daily. 01/27/20   [provider]  QUEtiapine (SEROQUEL) 300 MG tablet Take 300 mg by mouth at bedtime.    [provider]  triamcinolone cream (KENALOG) 0.1 % Apply 1 application topically 2 (two) times daily. 05/15/20   Orvan July, NP    Family History Family History  Problem Relation Age of Onset  . Cancer Father        ?type  . Hypertension Father   . Diabetes Mother   . Hypertension Mother     Social History Social History   Tobacco Use  .  Smoking status: Never Smoker  . Smokeless tobacco: Never Used  Substance Use Topics  . Alcohol use: No  . Drug use: No     Allergies   Adhesive [tape] and Latex   Review of Systems Review of Systems   Physical Exam Triage Vital Signs ED Triage Vitals  Enc Vitals Group     BP 05/15/20 1539 (!) 151/91     Pulse Rate 05/15/20 1539 71     Resp 05/15/20 1539 17     Temp 05/15/20 1539 97.9 F (36.6 C)     Temp Source 05/15/20 1539 Oral     SpO2 05/15/20 1539 100 %     Weight --      Height --      Head Circumference --      Peak Flow --      Pain Score 05/15/20 1537 7     Pain Loc --      Pain Edu? --      Excl. in [place]? --    No data found.  Updated Vital Signs BP (!) 151/91 (BP Location: Left Arm)   Pulse 71   Temp 97.9 F (36.6 C) (Oral)   Resp 17   LMP 04/18/2010   SpO2 100%   Visual Acuity Right Eye Distance:   Left Eye Distance:   Bilateral Distance:    Right Eye Near:   Left Eye Near:    Bilateral Near:     Physical Exam Vitals and nursing note reviewed.  Constitutional:      General: She is not in acute distress.    Appearance: Normal appearance. She is not ill-appearing, toxic-appearing or diaphoretic.  HENT:     Head: Normocephalic.     Nose: Nose normal.  Eyes:     Conjunctiva/sclera: Conjunctivae normal.  Cardiovascular:     Pulses:          Dorsalis pedis pulses are 2+ on the left side.  Pulmonary:     Effort: Pulmonary effort is normal.  Musculoskeletal:        General: Normal range of motion.     Cervical back: Normal range of motion.       Feet:  Feet:     Comments: Moderate Swelling, TTP Papule to medical aspect of foot.  Skin:    General: Skin is warm and dry.     Findings: No rash.  Neurological:     Mental Status: She is alert.  Psychiatric:        Mood and Affect: Mood normal.      UC Treatments / Results  Labs (all labs ordered are listed, but only abnormal results are  displayed) Labs Reviewed  SARS  CORONAVIRUS 2 (TAT 6-24 HRS)    EKG   Radiology No results found.  Procedures Procedures (including critical care time)  Medications Ordered in UC Medications  methylPREDNISolone sodium succinate (SOLU-MEDROL) 125 mg/2 mL injection 80 mg (80 mg Intramuscular Given 05/15/20 1601)    Initial Impression / Assessment and Plan / UC Course  I have reviewed the triage vital signs and the nursing notes.  Pertinent labs & imaging results that were available during my care of the patient were reviewed by me and considered in my medical decision making (see chart for details).     Foot swelling due to insect bite.  Possible allergic reaction due to large amount of swelling.  We will go ahead and cover with steroid injection here.  We will have her do Zyrtec and triamcinolone cream at home Follow up as needed for continued or worsening symptoms  Final Clinical Impressions(s) / UC Diagnoses   Final diagnoses:  Foot swelling  Insect bite of left foot, initial encounter     Discharge Instructions     Medication as prescribed Follow up as needed for continued or worsening symptoms     ED Prescriptions    Medication Sig Dispense Auth. Provider   triamcinolone cream (KENALOG) 0.1 % Apply 1 application topically 2 (two) times daily. 30 g Benton Tooker A, NP   cetirizine (ZYRTEC) 10 MG tablet Take 1 tablet (10 mg total) by mouth daily. 30 tablet Loura Halt A, NP     PDMP not reviewed this encounter.   Loura Halt A, NP 05/16/20 1309

## 2020-05-21 ENCOUNTER — Ambulatory Visit: Payer: Medicare Other | Admitting: Internal Medicine

## 2020-05-23 ENCOUNTER — Encounter (HOSPITAL_COMMUNITY): Payer: Self-pay

## 2020-05-23 ENCOUNTER — Encounter (HOSPITAL_COMMUNITY): Payer: Self-pay | Admitting: Student

## 2020-05-23 ENCOUNTER — Other Ambulatory Visit (HOSPITAL_COMMUNITY): Payer: Medicare Other

## 2020-05-23 NOTE — Progress Notes (Signed)
Verified appointment "no show" status with R. Cathey at 09:32.

## 2020-06-04 ENCOUNTER — Telehealth (HOSPITAL_COMMUNITY): Payer: Self-pay | Admitting: Student

## 2020-06-04 NOTE — Telephone Encounter (Signed)
Just an FYI. We have made several attempts to contact this patient including sending a letter to schedule or reschedule their echocardiogram. We will be removing the patient from the echo [place].  05/23/20 PT NO SHOWED MAILED LETTER [facility]  I10 179 lbs [place] no PA# needed per ordering office evd  03/08/20@945  LMOM to schedule echo [facility]    Thank you

## 2020-10-03 ENCOUNTER — Encounter: Payer: Medicare Other | Admitting: Internal Medicine

## 2020-10-03 ENCOUNTER — Telehealth: Payer: Self-pay | Admitting: *Deleted

## 2020-10-03 NOTE — Telephone Encounter (Signed)
CALLED PATIENT / UNABLE TO LEAVE MESSAGE / VOICE MAIL NOT SET UP. THIS IS IN REGARDS TO MISSED TODAY'S APPOINTMENT.

## 2020-12-12 ENCOUNTER — Ambulatory Visit (HOSPITAL_COMMUNITY)
Admission: EM | Admit: 2020-12-12 | Discharge: 2020-12-12 | Disposition: A | Discharge status: Home / Self Care | Payer: Medicare Other | Attending: Emergency Medicine | Admitting: Emergency Medicine

## 2020-12-12 ENCOUNTER — Encounter (HOSPITAL_COMMUNITY): Payer: Self-pay | Admitting: Emergency Medicine

## 2020-12-12 ENCOUNTER — Other Ambulatory Visit: Payer: Self-pay

## 2020-12-12 DIAGNOSIS — R21 Rash and other nonspecific skin eruption: Secondary | ICD-10-CM

## 2020-12-12 MED ORDER — BUTENAFINE HCL 1 % EX CREA: TOPICAL_CREAM | CUTANEOUS | 0 refills | Status: DC

## 2020-12-12 NOTE — Discharge Instructions (Addendum)
Apply the Lotrimin Ultra cream to your hands twice a day.    Avoid using hot water (you can use warm water).  Try to avoid contact with any irritants.   Follow up with wound care as scheduled.   Follow up with dermatology and primary care.

## 2020-12-12 NOTE — ED Provider Notes (Signed)
[place]    CSN: [id number] Arrival date & time: 12/12/20  3151      History   Chief Complaint Chief Complaint  Patient presents with  . Pruritis    HPI Lynn Gonzalez is a 60 y.o. female.   Patient here for evaluation of bilateral palm and hand irritation.  Reports being evaluated by dermatology and PCP and has been given multiple creams with minimal relief.  Reports has an appointment with wound care on May 12 but states that she needs something to hold her over until then.  Has not tried any OTC medications or treatments. Denies any trauma, injury, or other precipitating event.  Denies any specific alleviating or aggravating factors.  Denies any fevers, chest pain, shortness of breath, N/V/D, numbness, tingling, weakness, abdominal pain, or headaches.   ROS: As per HPI, all other pertinent ROS negative   The history is provided by the patient.    Past Medical History:  Diagnosis Date  . ANEMIA   . Anxiety   . Asthma   . B12 DEFICIENCY   . Bipolar 1 disorder ([place]) 1985  . BREAST CYST, LEFT   . Breast tumor   . GERD (gastroesophageal reflux disease)   . GOITER   . H/O cardiovascular stress test    Lex MV 4/14:  No ischemia, EF 72%, normal wall motion  . Hypertension   . Hyperthyroidism   . INTERSTITIAL LUNG DISEASE   . Irritable bowel syndrome with diarrhea     Patient Active Problem List   Diagnosis Date Noted  . Chronic diarrhea 12/14/2018  . Paresthesias 06/25/2016  . Prolonged Q-T interval on ECG 06/25/2016  . Hypertension 10/24/2013  . Hip pain 06/21/2013  . Snoring 04/07/2012  . GERD (gastroesophageal reflux disease) 01/22/2012  . Gout 03/12/2011  . GOITER 10/21/2010  . BREAST CYST, LEFT 07/23/2010  . B12 DEFICIENCY 03/08/2010  . Bipolar 1 disorder ([place]) 03/08/2010  . HYPERTHYROIDISM 02/11/2010  . ANEMIA 02/11/2010    Past Surgical History:  Procedure Laterality Date  . ABDOMINAL HYSTERECTOMY  06/03/10  . BUNIONECTOMY      bilateral  . GALLBLADDER SURGERY  1985   stones removed  . HYDRADENITIS EXCISION Bilateral 05/20/2017   Procedure: EXCISION BILATERAL HIDRADENITIS AXILLA;  Surgeon: Erroll Luna, MD;  Location: [facility];  Service: General;  Laterality: Bilateral;  . KNEE SURGERY  1987   bilateral  . LUMBAR LAMINECTOMY/DECOMPRESSION MICRODISCECTOMY Right 01/13/2013   Procedure: MICRODISCECTOMY FORAMINOTOMY OF L4-5 ON THE RIGHT;  Surgeon: Magnus Sinning, MD;  Location: [facility];  Service: Orthopedics;  Laterality: Right;  . MEDIASTINOSCOPY  2001  . STERNOTOMY  2001  . TONSILLECTOMY    . [place]    OB History   No obstetric history on file.      Home Medications    Prior to Admission medications   Medication Sig Start Date End Date Taking? Authorizing Provider  amLODipine (NORVASC) 5 MG tablet take 1 tablet by mouth once daily Patient taking differently: Take 5 mg by mouth at bedtime. 08/23/14  Yes Debbrah Alar, NP  Butenafine HCl 1 % cream Apply twice a day to affected area 12/12/20  Yes Pearson Forster, NP  Cholecalciferol (VITAMIN D-3) 5000 units TABS Take 1 tablet by mouth daily.   Yes [provider]  lithium 300 MG tablet Take 300-600 mg by mouth See admin instructions. Take 300mg  in the morning and 600mg  at bedtime.   Yes [provider]  QUEtiapine (SEROQUEL) 300 MG tablet Take 300 mg by mouth at bedtime.   Yes [provider]  albuterol (VENTOLIN HFA) 108 (90 Base) MCG/ACT inhaler Inhale 1-2 puffs into the lungs every 6 (six) hours as needed for shortness of breath. 10/01/19   [provider]  cetirizine (ZYRTEC) 10 MG tablet Take 1 tablet (10 mg total) by mouth daily. Patient not taking: Reported on 12/12/2020 05/15/20   Loura Halt A, NP  estradiol (ESTRACE) 2 MG tablet take 1 tablet by mouth once daily Patient taking differently: Take 2 mg by mouth daily.  08/23/14   Debbrah Alar, NP  potassium chloride SA (KLOR-CON) 20 MEQ  tablet Take 20 mEq by mouth daily. Patient not taking: Reported on 12/12/2020 01/27/20   [provider]  triamcinolone cream (KENALOG) 0.1 % Apply 1 application topically 2 (two) times daily. Patient not taking: Reported on 12/12/2020 05/15/20   Orvan July, NP    Family History Family History  Problem Relation Age of Onset  . Cancer Father        ?type  . Hypertension Father   . Diabetes Mother   . Hypertension Mother     Social History Social History   Tobacco Use  . Smoking status: Never Smoker  . Smokeless tobacco: Never Used  Vaping Use  . Vaping Use: Never used  Substance Use Topics  . Alcohol use: No  . Drug use: No     Allergies   Adhesive [tape] and Latex   Review of Systems Review of Systems  Skin: Positive for rash.  All other systems reviewed and are negative.    Physical Exam Triage Vital Signs ED Triage Vitals  Enc Vitals Group     BP 12/12/20 0842 (!) 157/103     Pulse Rate 12/12/20 0842 71     Resp 12/12/20 0842 18     Temp 12/12/20 0842 98 F (36.7 C)     Temp Source 12/12/20 0842 Oral     SpO2 12/12/20 0842 98 %     Weight --      Height --      Head Circumference --      Peak Flow --      Pain Score 12/12/20 0839 8     Pain Loc --      Pain Edu? --      Excl. in [place]? --    No data found.  Updated Vital Signs BP (!) 147/91 (BP Location: Right Arm)   Pulse 71   Temp 98 F (36.7 C) (Oral)   Resp 18   LMP 04/18/2010   SpO2 98%   Visual Acuity Right Eye Distance:   Left Eye Distance:   Bilateral Distance:    Right Eye Near:   Left Eye Near:    Bilateral Near:     Physical Exam Vitals and nursing note reviewed.  Constitutional:      General: She is not in acute distress.    Appearance: Normal appearance. She is not ill-appearing, toxic-appearing or diaphoretic.  HENT:     Head: Normocephalic and atraumatic.  Eyes:     Conjunctiva/sclera: Conjunctivae normal.  Cardiovascular:     Rate and Rhythm: Normal  rate.     Pulses: Normal pulses.  Pulmonary:     Effort: Pulmonary effort is normal.  Abdominal:     General: Abdomen is flat.  Musculoskeletal:        General: Normal range of motion.     Cervical  back: Normal range of motion.  Skin:    General: Skin is warm and dry.     Findings: Rash present. Rash is scaling.     Comments: See photo  Neurological:     General: No focal deficit present.     Mental Status: She is alert and oriented to person, place, and time.  Psychiatric:        Mood and Affect: Mood normal.        UC Treatments / Results  Labs (all labs ordered are listed, but only abnormal results are displayed) Labs Reviewed - No data to display  EKG   Radiology No results found.  Procedures Procedures (including critical care time)  Medications Ordered in UC Medications - No data to display  Initial Impression / Assessment and Plan / UC Course  I have reviewed the triage vital signs and the nursing notes.  Pertinent labs & imaging results that were available during my care of the patient were reviewed by me and considered in my medical decision making (see chart for details).     Assessment negative for red flags or concerns.  Possibly tinea manuum.  Patient has used nystatin/triamcinolone, betamethasone, and mometasone cream with minimal relief.  We will try Lotrimin ultra twice daily.  Discussed avoiding hot water and irritants.  Encourage patient to follow-up with wound care as scheduled.  Encourage patient to follow-up with PCP and dermatology.    Final Clinical Impressions(s) / UC Diagnoses   Final diagnoses:  Rash and nonspecific skin eruption     Discharge Instructions     Apply the Lotrimin Ultra cream to your hands twice a day.    Avoid using hot water (you can use warm water).  Try to avoid contact with any irritants.   Follow up with wound care as scheduled.   Follow up with dermatology and primary care.      ED Prescriptions     Medication Sig Dispense Auth. Provider   Butenafine HCl 1 % cream Apply twice a day to affected area 24 g Pearson Forster, NP     PDMP not reviewed this encounter.   Pearson Forster, NP 12/12/20 [phone]

## 2020-12-12 NOTE — ED Triage Notes (Signed)
Onset 4 months ago started having hands itch.  Patient has seen a dermatologist .  Patient has also seen her pcp.  Patient has had multiple creams.  Patient is die to go to wound center on may 12.  Hands are red, swollen, scaly, itching and soreness to center of hands from scratching to the point of hands bleeding.

## 2020-12-27 ENCOUNTER — Encounter (HOSPITAL_BASED_OUTPATIENT_CLINIC_OR_DEPARTMENT_OTHER): Payer: Medicare Other | Attending: Internal Medicine | Admitting: Internal Medicine

## 2020-12-27 ENCOUNTER — Other Ambulatory Visit: Payer: Self-pay

## 2020-12-27 DIAGNOSIS — R21 Rash and other nonspecific skin eruption: Secondary | ICD-10-CM | POA: Diagnosis present

## 2020-12-27 DIAGNOSIS — I10 Essential (primary) hypertension: Secondary | ICD-10-CM | POA: Diagnosis not present

## 2020-12-27 NOTE — Progress Notes (Signed)
Lynn Gonzalez, Lynn Gonzalez ([id number]) Visit Report for 12/27/2020 Abuse/Suicide Risk Screen Details Patient Name: Date of Service: Lynn Gonzalez, Lynn Gonzalez 12/27/2020 1:15 PM Medical Record Number: [id number] Patient Account Number: [id number] Date of Birth/Sex: Treating RN: 1961-05-30 (60 y.o. Lynn Gonzalez Primary Care Lynn Gonzalez: Lynn Gonzalez Other Clinician: Referring Lynn Gonzalez: Treating Lynn Gonzalez/Extender: Lynn Gonzalez in Treatment: 0 Abuse/Suicide Risk Screen Items Answer ABUSE RISK SCREEN: Has anyone close to you tried to hurt or harm you recentlyo No Do you feel uncomfortable with anyone in your familyo No Has anyone forced you do things that you didnt want to doo No Electronic Signature(s) Signed: 12/27/2020 4:36:36 PM By: Lorrin Jackson Entered By: Lorrin Jackson on 12/27/2020 13:17:36 -------------------------------------------------------------------------------- Activities of Daily Living Details Patient Name: Date of Service: Lynn Gonzalez, Lynn Gonzalez 12/27/2020 1:15 PM Medical Record Number: [id number] Patient Account Number: [id number] Date of Birth/Sex: Treating RN: 1961/04/16 (60 y.o. Lynn Gonzalez Primary Care Lynn Gonzalez: Lynn Gonzalez Other Clinician: Referring Lynn Gonzalez: Treating Lynn Gonzalez/Extender: Lynn Gonzalez in Treatment: 0 Activities of Daily Living Items Answer Activities of Daily Living (Please select one for each item) Drive Automobile Completely Able T Medications ake Completely Able Use T elephone Completely Able Care for Appearance Completely Able Use T oilet Completely Able Bath / Shower Completely Able Dress Self Completely Able Feed Self Completely Able Walk Completely Able Get In / Out Bed Completely Able Housework Completely Able Prepare Meals Completely [place] for Self Completely Able Electronic Signature(s) Signed: 12/27/2020 4:36:36 PM By: Lorrin Jackson Entered By:  Lorrin Jackson on 12/27/2020 13:17:58 -------------------------------------------------------------------------------- Education Screening Details Patient Name: Date of Service: Lynn Sarna A. 12/27/2020 1:15 PM Medical Record Number: [id number] Patient Account Number: [id number] Date of Birth/Sex: Treating RN: 1960/10/20 (60 y.o. Lynn Gonzalez Primary Care Ahava Kissoon: Lynn Gonzalez Other Clinician: Referring Jakhari Space: Treating Zekiah Caruth/Extender: Lynn Gonzalez in Treatment: 0 Primary Learner Assessed: Patient Learning Preferences/Education Level/Primary Language Learning Preference: Explanation, Demonstration, Printed Material Highest Education Level: High School Preferred Language: English Cognitive Barrier Language Barrier: No Translator Needed: No Memory Deficit: No Emotional Barrier: No Cultural/Religious Beliefs Affecting Medical Care: No Physical Barrier Impaired Vision: Yes Glasses Impaired Hearing: No Decreased Hand dexterity: No Knowledge/Comprehension Knowledge Level: High Comprehension Level: High Ability to understand written instructions: High Ability to understand verbal instructions: High Motivation Anxiety Level: Calm Cooperation: Cooperative Education Importance: Acknowledges Need Interest in Health Problems: Asks Questions Perception: Coherent Willingness to Engage in Self-Management High Activities: Readiness to Engage in Self-Management High Activities: Electronic Signature(s) Signed: 12/27/2020 4:36:36 PM By: Lorrin Jackson Entered By: Lorrin Jackson on 12/27/2020 13:18:33 -------------------------------------------------------------------------------- Fall Risk Assessment Details Patient Name: Date of Service: Lynn Sarna A. 12/27/2020 1:15 PM Medical Record Number: [id number] Patient Account Number: [id number] Date of Birth/Sex: Treating RN: 1961/03/16 (60 y.o. Lynn Gonzalez Primary Care Lynn Gonzalez: Lynn Gonzalez Other Clinician: Referring Lynn Gonzalez: Treating Lynn Gonzalez/Extender: Lynn Gonzalez in Treatment: 0 Fall Risk Assessment Items Have you had 2 or more falls in the last 12 monthso 0 No Have you had any fall that resulted in injury in the last 12 monthso 0 No FALLS RISK SCREEN History of falling - immediate or within 3 months 25 Yes Secondary diagnosis (Do you have 2 or more medical diagnoseso) 0 No Ambulatory aid None/bed rest/wheelchair/nurse 0 Yes Crutches/cane/walker 0 No Furniture 0 No Intravenous therapy Access/Saline/Heparin Lock 0 No Gait/Transferring Normal/ bed rest/ wheelchair 0 No Weak (short steps with or without shuffle, stooped but  able to lift head while walking, may seek 0 No support from furniture) Impaired (short steps with shuffle, may have difficulty arising from chair, head down, impaired 0 No balance) Mental Status Oriented to own ability 0 Yes Electronic Signature(s) Signed: 12/27/2020 4:36:36 PM By: Lorrin Jackson Entered By: Lorrin Jackson on 12/27/2020 13:18:48 -------------------------------------------------------------------------------- Foot Assessment Details Patient Name: Date of Service: Lynn Sarna A. 12/27/2020 1:15 PM Medical Record Number: [id number] Patient Account Number: [id number] Date of Birth/Sex: Treating RN: 22-Aug-1960 (60 y.o. Lynn Gonzalez Primary Care Lynn Gonzalez: Lynn Gonzalez Other Clinician: Referring Lynn Gonzalez: Treating Lynn Gonzalez/Extender: Lynn Gonzalez in Treatment: 0 Foot Assessment Items Site Locations + = Sensation present, - = Sensation absent, C = Callus, U = Ulcer R = Redness, W = Warmth, M = Maceration, PU = Pre-ulcerative lesion F = Fissure, S = Swelling, D = Dryness Assessment Right: Left: Other Deformity: No No Prior Foot Ulcer: No No Prior Amputation: No No Charcot Joint: No No Ambulatory Status: Gait: Notes N/A: hand wounds Electronic  Signature(s) Signed: 12/27/2020 4:36:36 PM By: Lorrin Jackson Entered By: Lorrin Jackson on 12/27/2020 13:19:20 -------------------------------------------------------------------------------- Nutrition Risk Screening Details Patient Name: Date of Service: Lynn Gonzalez, Lynn Gonzalez 12/27/2020 1:15 PM Medical Record Number: [id number] Patient Account Number: [id number] Date of Birth/Sex: Treating RN: 12-Aug-1961 (60 y.o. Lynn Gonzalez Primary Care Simcha Speir: Lynn Gonzalez Other Clinician: Referring Elgie Landino: Treating Ajai Harville/Extender: Lynn Gonzalez in Treatment: 0 Height (in): Weight (lbs): Body Mass Index (BMI): Nutrition Risk Screening Items Score Screening NUTRITION RISK SCREEN: I have an illness or condition that made me change the kind and/or amount of food I eat 0 No I eat fewer than two meals per day 0 No I eat few fruits and vegetables, or milk products 0 No I have three or more drinks of beer, liquor or wine almost every day 0 No I have tooth or mouth problems that make it hard for me to eat 0 No I don't always have enough money to buy the food I need 0 No I eat alone most of the time 0 No I take three or more different prescribed or over-the-counter drugs a day 1 Yes Without wanting to, I have lost or gained 10 pounds in the last six months 0 No I am not always physically able to shop, cook and/or feed myself 0 No Nutrition Protocols Good Risk Protocol Moderate Risk Protocol High Risk Proctocol Risk Level: Good Risk Score: 1 Electronic Signature(s) Signed: 12/27/2020 4:36:36 PM By: Lorrin Jackson Entered By: Lorrin Jackson on 12/27/2020 13:18:57

## 2020-12-28 NOTE — Progress Notes (Signed)
Lynn Gonzalez, Lynn Gonzalez ([id number]) Visit Report for 12/27/2020 Chief Complaint Document Details Patient Name: Date of Service: Lynn, Gonzalez 12/27/2020 1:15 PM Medical Record Number: [id number] Patient Account Number: [id number] Date of Birth/Sex: Treating RN: 08-Oct-1960 (60 y.o. Lynn Gonzalez Primary Care Provider: Cipriano Mile Other Clinician: Referring Provider: Treating Provider/Extender: Phineas Douglas Weeks in Treatment: 0 Information Obtained from: Patient Chief Complaint rash to hand bilaterally Electronic Signature(s) Signed: 12/28/2020 11:19:03 AM By: Kalman Shan DO Entered By: Kalman Shan on 12/28/2020 11:10:49 -------------------------------------------------------------------------------- HPI Details Patient Name: Date of Service: Lynn Sarna A. 12/27/2020 1:15 PM Medical Record Number: [id number] Patient Account Number: [id number] Date of Birth/Sex: Treating RN: [birth year] (60 y.o. Lynn Gonzalez, Lynn Gonzalez Primary Care Provider: Cipriano Mile Other Clinician: Referring Provider: Treating Provider/Extender: Phineas Douglas Weeks in Treatment: 0 History of Present Illness HPI Description: Lynn Gonzalez is a 60 year old female with a past medical history of bipolar 1 disorder that presents to our clinic for a 4-month history of rash to her hands bilaterally. She states this started as a small bump to her left hand and she started scratching and then it appeared on her right hand. She states she has a few spots on her back that itch as well. She has no open wounds and denies any drainage from her hands. She has been seen by dermatology and urgent care for this issue. She was prescribed nystatin and triamcinolone combination cream as well as betamethasone. She states that the latter has helped some but she used up a tube in 3 days. She denies any increased warmth or erythema to her hands. She states she has a history of eczema  when she was younger. She states she has never had anything to this extreme before. Her hands are mostly itchy with dry flaking skin. She denies increased warmth or erythema to her hands bilaterally. Electronic Signature(s) Signed: 12/28/2020 11:19:03 AM By: Kalman Shan DO Entered By: Kalman Shan on 12/28/2020 11:12:24 -------------------------------------------------------------------------------- Physical Exam Details Patient Name: Date of Service: Lynn Gonzalez 12/27/2020 1:15 PM Medical Record Number: [id number] Patient Account Number: [id number] Date of Birth/Sex: Treating RN: [birth year] (60 y.o. Lynn Gonzalez Primary Care Provider: Other Clinician: Cipriano Mile Referring Provider: Treating Provider/Extender: Phineas Douglas Weeks in Treatment: 0 Constitutional respirations regular, non-labored and within target range for patient.Marland Kitchen Psychiatric pleasant and cooperative. Notes Hands bilaterally with dry crusted flaking skin. No open wounds. No swelling noted. Electronic Signature(s) Signed: 12/28/2020 11:19:03 AM By: Kalman Shan DO Entered By: Kalman Shan on 12/28/2020 11:13:24 -------------------------------------------------------------------------------- Physician Orders Details Patient Name: Date of Service: Lynn Sarna A. 12/27/2020 1:15 PM Medical Record Number: [id number] Patient Account Number: [id number] Date of Birth/Sex: Treating RN: 20-Aug-1960 (60 y.o. Lynn Gonzalez Primary Care Provider: Cipriano Mile Other Clinician: Referring Provider: Treating Provider/Extender: Phineas Douglas Weeks in Treatment: 0 Verbal / Phone Orders: No Diagnosis Coding ICD-10 Coding Code Description R21 Rash and other nonspecific skin eruption Follow-up Appointments Other: - No need to follow up. Follow up with your dermatologist Discharge From [facility] Services Discharge from [place] Non Wound  Condition Other Non Wound Condition Orders/Instructions: - Get your refills on your steroid cream and continue using that. Electronic Signature(s) Signed: 12/28/2020 11:19:03 AM By: Kalman Shan DO Entered By: Kalman Shan on 12/28/2020 11:13:39 -------------------------------------------------------------------------------- Problem List Details Patient Name: Date of Service: Lynn Gonzalez. 12/27/2020 1:15 PM Medical Record Number: [id number] Patient Account Number: [id number] Date of Birth/Sex: Treating RN:  1960/11/17 (60 y.o. Lynn Gonzalez, Lynn Gonzalez Primary Care Provider: Cipriano Mile Other Clinician: Referring Provider: Treating Provider/Extender: Phineas Douglas Weeks in Treatment: 0 Active Problems ICD-10 Encounter Code Description Active Date MDM Diagnosis R21 Rash and other nonspecific skin eruption 12/27/2020 No Yes Inactive Problems Resolved Problems Electronic Signature(s) Signed: 12/28/2020 11:19:03 AM By: Kalman Shan DO Entered By: Kalman Shan on 12/28/2020 11:10:16 -------------------------------------------------------------------------------- Progress Note Details Patient Name: Date of Service: Lynn Sarna A. 12/27/2020 1:15 PM Medical Record Number: [id number] Patient Account Number: [id number] Date of Birth/Sex: Treating RN: [birth year] (59 y.o. Lynn Gonzalez, Lynn Gonzalez Primary Care Provider: Cipriano Mile Other Clinician: Referring Provider: Treating Provider/Extender: Phineas Douglas Weeks in Treatment: 0 Subjective Chief Complaint Information obtained from Patient rash to hand bilaterally History of Present Illness (HPI) Ms. Opfer is a 60 year old female with a past medical history of bipolar 1 disorder that presents to our clinic for a 45-month history of rash to her hands bilaterally. She states this started as a small bump to her left hand and she started scratching and then it appeared on her right hand.  She states she has a few spots on her back that itch as well. She has no open wounds and denies any drainage from her hands. She has been seen by dermatology and urgent care for this issue. She was prescribed nystatin and triamcinolone combination cream as well as betamethasone. She states that the latter has helped some but she used up a tube in 3 days. She denies any increased warmth or erythema to her hands. She states she has a history of eczema when she was younger. She states she has never had anything to this extreme before. Her hands are mostly itchy with dry flaking skin. She denies increased warmth or erythema to her hands bilaterally. Patient History Information obtained from Patient. Allergies No Known Allergies Family History Cancer - Father, Diabetes - Mother, No family history of Heart Disease, Hereditary Spherocytosis, Hypertension, Kidney Disease, Lung Disease, Seizures, Stroke, Thyroid Problems, Tuberculosis. Social History Never smoker, Marital Status - Single, Alcohol Use - Never, Drug Use - No History, Caffeine Use - Rarely. Medical History Respiratory Patient has history of Asthma Cardiovascular Patient has history of Hypertension Medical A Surgical History Notes nd Endocrine Hyperthyroidism Review of Systems (ROS) Eyes Complains or has symptoms of Glasses / Contacts. Gastrointestinal Complains or has symptoms of Frequent diarrhea, IBS Endocrine Denies complaints or symptoms of Heat/cold intolerance. Genitourinary Denies complaints or symptoms of Frequent urination. Musculoskeletal Denies complaints or symptoms of Muscle Pain, Muscle Weakness. Neurologic Denies complaints or symptoms of Numbness/parasthesias. Psychiatric Denies complaints or symptoms of Claustrophobia, Suicidal. Objective Constitutional respirations regular, non-labored and within target range for patient.. Vitals Time Taken: 1:12 PM, Temperature: 98.7 F, Pulse: 81 bpm, Respiratory  Rate: 16 breaths/min, Blood Pressure: 131/84 mmHg. Psychiatric pleasant and cooperative. General Notes: Hands bilaterally with dry crusted flaking skin. No open wounds. No swelling noted. Assessment Active Problems ICD-10 Rash and other nonspecific skin eruption Patient presents with a 65-month history of worsening bilateral hand itching and flaking skin. She has no open wounds at this time. She has tried steroid cream and antifungal cream. We discussed extensively the differential including crusted scabies, psoriasis/eczema, contact dermatitis, and tinea Manuum. I advised that she continue to follow with dermatology and states she will do so Immediately. There is no need for her to follow-up here as she has no wounds. Plan Follow-up Appointments: Other: - No need to follow up. Follow up with your  dermatologist Discharge From [facility] Services: Discharge from [place] Non Wound Condition: Other Non Wound Condition Orders/Instructions: - Get your refills on your steroid cream and continue using that. 1. Follow-up with dermatology Electronic Signature(s) Signed: 12/28/2020 11:19:03 AM By: Kalman Shan DO Entered By: Kalman Shan on 12/28/2020 11:18:15 -------------------------------------------------------------------------------- HxROS Details Patient Name: Date of Service: Lynn Sarna A. 12/27/2020 1:15 PM Medical Record Number: [id number] Patient Account Number: [id number] Date of Birth/Sex: Treating RN: 28-Sep-1960 (60 y.o. Lynn Gonzalez Primary Care Provider: Other Clinician: Cipriano Mile Referring Provider: Treating Provider/Extender: Phineas Douglas Weeks in Treatment: 0 Information Obtained From Patient Eyes Complaints and Symptoms: Positive for: Glasses / Contacts Gastrointestinal Complaints and Symptoms: Positive for: Frequent diarrhea Review of System Notes: IBS Endocrine Complaints and Symptoms: Negative for: Heat/cold  intolerance Medical History: Past Medical History Notes: Hyperthyroidism Genitourinary Complaints and Symptoms: Negative for: Frequent urination Musculoskeletal Complaints and Symptoms: Negative for: Muscle Pain; Muscle Weakness Neurologic Complaints and Symptoms: Negative for: Numbness/parasthesias Psychiatric Complaints and Symptoms: Negative for: Claustrophobia; Suicidal Respiratory Medical History: Positive for: Asthma Cardiovascular Medical History: Positive for: Hypertension Immunological Integumentary (Skin) Oncologic Immunizations Pneumococcal Vaccine: Received Pneumococcal Vaccination: No Implantable Devices None Family and Social History Cancer: Yes - Father; Diabetes: Yes - Mother; Heart Disease: No; Hereditary Spherocytosis: No; Hypertension: No; Kidney Disease: No; Lung Disease: No; Seizures: No; Stroke: No; Thyroid Problems: No; Tuberculosis: No; Never smoker; Marital Status - Single; Alcohol Use: Never; Drug Use: No History; Caffeine Use: Rarely; Financial Concerns: No; Food, Clothing or Shelter Needs: No; Support System Lacking: No; Transportation Concerns: No Electronic Signature(s) Signed: 12/27/2020 4:36:36 PM By: Lorrin Jackson Signed: 12/28/2020 11:19:03 AM By: Kalman Shan DO Entered By: Lorrin Jackson on 12/27/2020 13:17:30 -------------------------------------------------------------------------------- SuperBill Details Patient Name: Date of Service: Lynn Gonzalez 12/27/2020 Medical Record Number: [id number] Patient Account Number: [id number] Date of Birth/Sex: Treating RN: Jul 31, 1961 (60 y.o. Lynn Gonzalez, Lynn Gonzalez Primary Care Provider: Cipriano Mile Other Clinician: Referring Provider: Treating Provider/Extender: Phineas Douglas Weeks in Treatment: 0 Diagnosis Coding ICD-10 Codes Code Description R21 Rash and other nonspecific skin eruption Facility Procedures CPT4 Code: 81856314 Description: 99213 - WOUND CARE  VISIT-LEV 3 EST PT Modifier: Quantity: 1 Physician Procedures : CPT4 Code Description Modifier [id number] [place] PHYS LEVEL 3 NEW PT ICD-10 Diagnosis Description R21 Rash and other nonspecific skin eruption Quantity: 1 Electronic Signature(s) Signed: 12/28/2020 11:19:03 AM By: Kalman Shan DO Entered By: Kalman Shan on 12/28/2020 11:18:29

## 2020-12-31 NOTE — Progress Notes (Addendum)
JACQULENE, HUNTLEY ([id number]) Visit Report for 12/27/2020 Allergy List Details Patient Name: Date of Service: Lynn Gonzalez, Lynn Gonzalez 12/27/2020 1:15 PM Medical Record Number: [id number] Patient Account Number: [id number] Date of Birth/Sex: Treating RN: [birth year] (60 y.o. Female) Lorrin Jackson Primary Care Tiyonna Sardinha: Cipriano Mile Other Clinician: Referring Dareion Kneece: Treating Natasha Paulson/Extender: Kalman Shan Weeks in Treatment: 0 Allergies Active Allergies No Known Allergies Allergy Notes Electronic Signature(s) Signed: 12/27/2020 4:36:36 PM By: Lorrin Jackson Entered By: Lorrin Jackson on 12/27/2020 13:13:20 -------------------------------------------------------------------------------- Arrival Information Details Patient Name: Date of Service: Lynn Sarna A. 12/27/2020 1:15 PM Medical Record Number: [id number] Patient Account Number: [id number] Date of Birth/Sex: Treating RN: Mar 21, 1961 (60 y.o. Female) Lorrin Jackson Primary Care Willem Klingensmith: Cipriano Mile Other Clinician: Referring Deyona Soza: Treating Terek Bee/Extender: Yaakov Guthrie in Treatment: 0 Visit Information Patient Arrived: Ambulatory Arrival Time: 13:06 Transfer Assistance: None Patient Identification Verified: Yes Secondary Verification Process Completed: Yes Patient Requires Transmission-Based Precautions: No Patient Has Alerts: No Electronic Signature(s) Signed: 12/27/2020 4:36:36 PM By: Lorrin Jackson Entered By: Lorrin Jackson on 12/27/2020 13:12:40 -------------------------------------------------------------------------------- Clinic Level of Care Assessment Details Patient Name: Date of Service: Lynn Gonzalez, Lynn Gonzalez 12/27/2020 1:15 PM Medical Record Number: [id number] Patient Account Number: [id number] Date of Birth/Sex: Treating RN: 1961-03-05 (60 y.o. Female) Rhae Hammock Primary Care Dellene Mcgroarty: Cipriano Mile Other Clinician: Referring Lanesha Azzaro: Treating Adaysha Dubinsky/Extender: Yaakov Guthrie in Treatment: 0 Clinic Level of Care Assessment Items TOOL 2 Quantity Score X- 1 0 Use when only an EandM is performed on the INITIAL visit ASSESSMENTS - Nursing Assessment / Reassessment X- 1 20 General Physical Exam (combine w/ comprehensive assessment (listed just below) when performed on new pt. evals) X- 1 25 Comprehensive Assessment (HX, ROS, Risk Assessments, Wounds Hx, etc.) ASSESSMENTS - Wound and Skin A ssessment / Reassessment X - Simple Wound Assessment / Reassessment - one wound 1 5 []  - 0 Complex Wound Assessment / Reassessment - multiple wounds X- 1 10 Dermatologic / Skin Assessment (not related to wound area) ASSESSMENTS - Ostomy and/or Continence Assessment and Care []  - 0 Incontinence Assessment and Management []  - 0 Ostomy Care Assessment and Management (repouching, etc.) PROCESS - Coordination of Care X - Simple Patient / Family Education for ongoing care 1 15 []  - 0 Complex (extensive) Patient / Family Education for ongoing care X- 1 10 Staff obtains Programmer, systems, Records, T Results / Process Orders est []  - 0 Staff telephones HHA, Nursing Homes / Clarify orders / etc []  - 0 Routine Transfer to another Facility (non-emergent condition) []  - 0 Routine Hospital Admission (non-emergent condition) X- 1 15 New Admissions / Biomedical engineer / Ordering NPWT Apligraf, etc. , []  - 0 Emergency Hospital Admission (emergent condition) X- 1 10 Simple Discharge Coordination []  - 0 Complex (extensive) Discharge Coordination PROCESS - Special Needs []  - 0 Pediatric / Minor Patient Management []  - 0 Isolation Patient Management []  - 0 Hearing / Language / Visual special needs []  - 0 Assessment of Community assistance (transportation, D/C planning, etc.) []  - 0 Additional assistance / Altered mentation []  - 0 Support Surface(s) Assessment (bed, cushion, seat, etc.) INTERVENTIONS - Wound Cleansing / Measurement []  - 0 Wound Imaging  (photographs - any number of wounds) []  - 0 Wound Tracing (instead of photographs) []  - 0 Simple Wound Measurement - one wound []  - 0 Complex Wound Measurement - multiple wounds []  - 0 Simple Wound Cleansing - one wound []  - 0 Complex Wound Cleansing - multiple wounds INTERVENTIONS - Wound Dressings []  - 0  Small Wound Dressing one or multiple wounds []  - 0 Medium Wound Dressing one or multiple wounds []  - 0 Large Wound Dressing one or multiple wounds []  - 0 Application of Medications - injection INTERVENTIONS - Miscellaneous []  - 0 External ear exam []  - 0 Specimen Collection (cultures, biopsies, blood, body fluids, etc.) []  - 0 Specimen(s) / Culture(s) sent or taken to Lab for analysis []  - 0 Patient Transfer (multiple staff / Harrel Lemon Lift / Similar devices) []  - 0 Simple Staple / Suture removal (25 or less) []  - 0 Complex Staple / Suture removal (26 or more) []  - 0 Hypo / Hyperglycemic Management (close monitor of Blood Glucose) []  - 0 Ankle / Brachial Index (ABI) - do not check if billed separately Has the patient been seen at the hospital within the last three years: Yes Total Score: 110 Level Of Care: New/Established - Level 3 Electronic Signature(s) Signed: 12/31/2020 6:08:57 PM By: Rhae Hammock RN Entered By: Rhae Hammock on 12/27/2020 13:33:02 -------------------------------------------------------------------------------- Lower Extremity Assessment Details Patient Name: Date of Service: Lynn Sarna A. 12/27/2020 1:15 PM Medical Record Number: [id number] Patient Account Number: [id number] Date of Birth/Sex: Treating RN: Sep 13, 1960 (60 y.o. Female) Lorrin Jackson Primary Care Hanley Rispoli: Cipriano Mile Other Clinician: Referring Everard Interrante: Treating Jaylnn Ullery/Extender: Yaakov Guthrie in Treatment: 0 Notes N/A: Hand Wounds Electronic Signature(s) Signed: 12/27/2020 4:36:36 PM By: Lorrin Jackson Entered By: Lorrin Jackson on 12/27/2020  13:19:48 -------------------------------------------------------------------------------- Multi Wound Chart Details Patient Name: Date of Service: Lynn Sarna A. 12/27/2020 1:15 PM Medical Record Number: [id number] Patient Account Number: [id number] Date of Birth/Sex: Treating RN: 05-Jun-1961 (60 y.o. Female) Rhae Hammock Primary Care Darvin Dials: Cipriano Mile Other Clinician: Referring Rosemae Mcquown: Treating Teagyn Fishel/Extender: Yaakov Guthrie in Treatment: 0 Vital Signs Height(in): Pulse(bpm): 81 Weight(lbs): Blood Pressure(mmHg): 131/84 Body Mass Index(BMI): Temperature(F): 98.7 Respiratory Rate(breaths/min): 16 Electronic Signature(s) Signed: 12/28/2020 11:19:03 AM By: Kalman Shan DO Signed: 12/31/2020 6:08:57 PM By: Rhae Hammock RN -------------------------------------------------------------------------------- Multi-Disciplinary Care Plan Details Patient Name: Date of Service: Lynn Sarna A. 12/27/2020 1:15 PM Medical Record Number: [id number] Patient Account Number: [id number] Date of Birth/Sex: Treating RN: [birth year] (60 y.o. Female) Rhae Hammock Primary Care Clemence Lengyel: Cipriano Mile Other Clinician: Referring Anaika Santillano: Treating Jerimey Burridge/Extender: Yaakov Guthrie in Treatment: 0 Active Inactive Electronic Signature(s) Signed: 09/05/2021 12:42:14 PM By: Rhae Hammock RN Previous Signature: 12/31/2020 6:08:57 PM Version By: Rhae Hammock RN Entered By: Rhae Hammock on 02/08/2021 18:04:55 -------------------------------------------------------------------------------- Pain Assessment Details Patient Name: Date of Service: Lynn Gonzalez 12/27/2020 1:15 PM Medical Record Number: [id number] Patient Account Number: [id number] Date of Birth/Sex: Treating RN: Mar 05, 1961 (60 y.o. Female) Lorrin Jackson Primary Care Leda Bellefeuille: Cipriano Mile Other Clinician: Referring Chloe Bluett: Treating Regenia Erck/Extender: Yaakov Guthrie in Treatment: 0 Active Problems Location of Pain Severity and Description of Pain Patient Has Paino Yes Site Locations Pain Location: Pain in Ulcers With Dressing Change: Yes Duration of the Pain. Constant / Intermittento Intermittent Rate the pain. Current Pain Level: 4 Character of Pain Describe the Pain: Burning, Tender Pain Management and Medication Current Pain Management: Medication: Yes Cold Application: No Rest: Yes Massage: No Activity: No T.E.N.S.: No Heat Application: No Leg drop or elevation: No Is the Current Pain Management Adequate: Inadequate How does your wound impact your activities of daily livingo Sleep: Yes Bathing: No Appetite: No Relationship With Others: No Bladder Continence: No Emotions: No Bowel Continence: No Work: No Toileting: No Drive: No Dressing: No Hobbies: No Electronic Signature(s) Signed: 12/27/2020 4:36:36 PM By: Lorrin Jackson Entered By:  Lorrin Jackson on 12/27/2020 13:21:10 -------------------------------------------------------------------------------- Patient/Caregiver Education Details Patient Name: Date of Service: Lynn Gonzalez, Lynn Gonzalez 5/12/2022andnbsp1:15 PM Medical Record Number: [id number] Patient Account Number: [id number] Date of Birth/Gender: Treating RN: 1960-12-19 (60 y.o. Female) Rhae Hammock Primary Care Physician: Cipriano Mile Other Clinician: Referring Physician: Treating Physician/Extender: Yaakov Guthrie in Treatment: 0 Education Assessment Education Provided To: Patient Education Topics Provided Welcome T [facility]: o Methods: Explain/Verbal Responses: State content correctly Electronic Signature(s) Signed: 09/05/2021 12:42:14 PM By: Rhae Hammock RN Previous Signature: 12/31/2020 6:08:57 PM Version By: Rhae Hammock RN Entered By: Rhae Hammock on 02/08/2021  18:05:04 -------------------------------------------------------------------------------- Vitals Details Patient Name: Date of Service: Lynn Sarna A. 12/27/2020 1:15 PM Medical Record Number: [id number] Patient Account Number: [id number] Date of Birth/Sex: Treating RN: 1961/08/03 (60 y.o. Female) Lorrin Jackson Primary Care Selma Mink: Cipriano Mile Other Clinician: Referring Jazmin Ley: Treating Desmund Elman/Extender: Yaakov Guthrie in Treatment: 0 Vital Signs Time Taken: 13:12 Temperature (F): 98.7 Pulse (bpm): 81 Respiratory Rate (breaths/min): 16 Blood Pressure (mmHg): 131/84 Reference Range: 80 - 120 mg / dl Electronic Signature(s) Signed: 12/27/2020 4:36:36 PM By: Lorrin Jackson Entered By: Lorrin Jackson on 12/27/2020 13:13:02

## 2021-02-15 ENCOUNTER — Encounter (HOSPITAL_COMMUNITY): Payer: Self-pay | Admitting: Emergency Medicine

## 2021-02-15 ENCOUNTER — Emergency Department (HOSPITAL_COMMUNITY)
Admission: EM | Admit: 2021-02-15 | Discharge: 2021-02-15 | Disposition: A | Discharge status: Home / Self Care | Payer: Medicare Other | Attending: Emergency Medicine | Admitting: Emergency Medicine

## 2021-02-15 ENCOUNTER — Other Ambulatory Visit: Payer: Self-pay

## 2021-02-15 DIAGNOSIS — Z853 Personal history of malignant neoplasm of breast: Secondary | ICD-10-CM | POA: Insufficient documentation

## 2021-02-15 DIAGNOSIS — I10 Essential (primary) hypertension: Secondary | ICD-10-CM | POA: Insufficient documentation

## 2021-02-15 DIAGNOSIS — J45909 Unspecified asthma, uncomplicated: Secondary | ICD-10-CM | POA: Diagnosis not present

## 2021-02-15 DIAGNOSIS — Z79899 Other long term (current) drug therapy: Secondary | ICD-10-CM | POA: Diagnosis not present

## 2021-02-15 DIAGNOSIS — R21 Rash and other nonspecific skin eruption: Secondary | ICD-10-CM | POA: Diagnosis present

## 2021-02-15 DIAGNOSIS — Z91040 Latex allergy status: Secondary | ICD-10-CM | POA: Diagnosis not present

## 2021-02-15 LAB — BASIC METABOLIC PANEL
Anion gap: 9 (ref 5–15)
BUN: 8 mg/dL (ref 6–20)
CO2: 24 mmol/L (ref 22–32)
Calcium: 10.4 mg/dL — ABNORMAL HIGH (ref 8.9–10.3)
Chloride: 105 mmol/L (ref 98–111)
Creatinine, Ser: 0.81 mg/dL (ref 0.44–1.00)
GFR, Estimated: >60 mL/min (ref >60)
Glucose, Bld: 91 mg/dL (ref 70–99)
Potassium: 3.8 mmol/L (ref 3.5–5.1)
Sodium: 138 mmol/L (ref 135–145)

## 2021-02-15 LAB — CBC WITH DIFFERENTIAL/PLATELET
Abs Immature Granulocytes: 0.02 10*3/uL (ref 0.00–0.07)
Basophils Absolute: 0 10*3/uL (ref 0.0–0.1)
Basophils Relative: 0 %
Eosinophils Absolute: 0.2 10*3/uL (ref 0.0–0.5)
Eosinophils Relative: 2 %
HCT: 40.3 % (ref 36.0–46.0)
Hemoglobin: 11.9 g/dL — ABNORMAL LOW (ref 12.0–15.0)
Immature Granulocytes: 0 %
Lymphocytes Relative: 19 %
Lymphs Abs: 1.5 10*3/uL (ref 0.7–4.0)
MCH: 26.9 pg (ref 26.0–34.0)
MCHC: 29.5 g/dL — ABNORMAL LOW (ref 30.0–36.0)
MCV: 91 fL (ref 80.0–100.0)
Monocytes Absolute: 0.6 10*3/uL (ref 0.1–1.0)
Monocytes Relative: 7 %
Neutro Abs: 5.8 10*3/uL (ref 1.7–7.7)
Neutrophils Relative %: 72 %
Platelets: 175 10*3/uL (ref 150–400)
RBC: 4.43 MIL/uL (ref 3.87–5.11)
RDW: 13.2 % (ref 11.5–15.5)
WBC: 8.1 10*3/uL (ref 4.0–10.5)
nRBC: 0 % (ref 0.0–0.2)

## 2021-02-15 LAB — SEDIMENTATION RATE: Sed Rate: 6 mm/hr (ref 0–22)

## 2021-02-15 LAB — C-REACTIVE PROTEIN: CRP: 0.5 mg/dL (ref <1.0)

## 2021-02-15 MED ORDER — DERMAPHOR EX OINT: 1.0000 "application " | TOPICAL_OINTMENT | Freq: Three times a day (TID) | CUTANEOUS | 0 refills | Status: AC

## 2021-02-15 MED ORDER — MORPHINE SULFATE (PF) 4 MG/ML IV SOLN
4.0000 mg | Freq: Once | INTRAVENOUS | Status: AC
  Administered 2021-02-15: 4 mg via INTRAVENOUS
  Filled 2021-02-15: qty 1

## 2021-02-15 MED ORDER — DEXAMETHASONE SODIUM PHOSPHATE 10 MG/ML IJ SOLN
10.0000 mg | Freq: Once | INTRAMUSCULAR | Status: AC
  Administered 2021-02-15: 10 mg via INTRAVENOUS
  Filled 2021-02-15: qty 1

## 2021-02-15 MED ORDER — MUPIROCIN CALCIUM 2 % EX CREA: 1.0000 "application " | TOPICAL_CREAM | Freq: Two times a day (BID) | CUTANEOUS | 0 refills | Status: DC

## 2021-02-15 NOTE — Discharge Instructions (Signed)
You have been seen and discharged from the emergency department.  I believe you may be suffering from psoriasis on the palms of the hand.  Use emollient and cortisone cream as directed.  You may apply mupirocin to the raw skin areas to avoid infection.  Follow-up with your primary provider for reevaluation and further care. Take home medications as prescribed. If you have any worsening symptoms or further concerns for your health please return to an emergency department for further evaluation.

## 2021-02-15 NOTE — ED Triage Notes (Addendum)
Pt reports bilateral hand with rash and swelling that has been getting worse since February. Denies injuries or cause of rash. Pt has followed up with dermatology and prescribed Acitretin capsules and Betamethasone cream with no relief. Pt unable to take pills d/t making her feel sick to stomach. Pt reports pain is so bad she can't sleep, requesting something strong for pain.

## 2021-02-15 NOTE — ED Provider Notes (Addendum)
[facility] EMERGENCY DEPARTMENT Provider Note   CSN: [id number] Arrival date & time: 02/15/21  0744     History Chief Complaint  Patient presents with   Hand Pain   Rash    Lynn Gonzalez is a 60 y.o. female.  HPI  60 year old female past medical history of HTN, IBS, chronic anemia, anxiety, bipolar disorder presents emergency department with bilateral hand rash.  Patient states this started back in February.  She initially had 1 small area that was cracked and peeling and this is progressed to her entire palms bilaterally.  She has been seen by dermatology as an outpatient, tried multiple creams and oral medications without significant relief.  She is now being referred to [facility].  She presents today because she states the pain from the cracked peeling hands is so severe that she is unable to sleep.  She denies any fever.  States that she has not had any outpatient blood work done or evaluation in terms of autoimmune.  The rash is beginning to spread to her chest and right upper arm.  No feet involvement, no mucosal involvement, no fever.  No new medications.  No new lotions/detergents/cleaning supplies.  Past Medical History:  Diagnosis Date   ANEMIA    Anxiety    Asthma    B12 DEFICIENCY    Bipolar 1 disorder ([place]) 1985   BREAST CYST, LEFT    Breast tumor    GERD (gastroesophageal reflux disease)    GOITER    H/O cardiovascular stress test    Lex MV 4/14:  No ischemia, EF 72%, normal wall motion   Hypertension    Hyperthyroidism    INTERSTITIAL LUNG DISEASE    Irritable bowel syndrome with diarrhea     Patient Active Problem List   Diagnosis Date Noted   Chronic diarrhea 12/14/2018   Paresthesias 06/25/2016   Prolonged Q-T interval on ECG 06/25/2016   Hypertension 10/24/2013   Hip pain 06/21/2013   Snoring 04/07/2012   GERD (gastroesophageal reflux disease) 01/22/2012   Gout 03/12/2011   GOITER 10/21/2010   BREAST CYST, LEFT 07/23/2010   B12  DEFICIENCY 03/08/2010   Bipolar 1 disorder ([place]) 03/08/2010   HYPERTHYROIDISM 02/11/2010   ANEMIA 02/11/2010    Past Surgical History:  Procedure Laterality Date   ABDOMINAL HYSTERECTOMY  06/03/10   BUNIONECTOMY     bilateral   GALLBLADDER SURGERY  1985   stones removed   HYDRADENITIS EXCISION Bilateral 05/20/2017   Procedure: EXCISION BILATERAL HIDRADENITIS AXILLA;  Surgeon: Erroll Luna, MD;  Location: [place];  Service: General;  Laterality: Bilateral;   KNEE SURGERY  1987   bilateral   LUMBAR LAMINECTOMY/DECOMPRESSION MICRODISCECTOMY Right 01/13/2013   Procedure: MICRODISCECTOMY FORAMINOTOMY OF L4-5 ON THE RIGHT;  Surgeon: Magnus Sinning, MD;  Location: [facility];  Service: Orthopedics;  Laterality: Right;   MEDIASTINOSCOPY  2001   STERNOTOMY  2001   [place]     OB History   No obstetric history on file.     Family History  Problem Relation Age of Onset   Cancer Father        ?type   Hypertension Father    Diabetes Mother    Hypertension Mother     Social History   Tobacco Use   Smoking status: Never   Smokeless tobacco: Never  Vaping Use   Vaping Use: Never used  Substance Use Topics   Alcohol use: No  Drug use: No    Home Medications Prior to Admission medications   Medication Sig Start Date End Date Taking? Authorizing Provider  albuterol (VENTOLIN HFA) 108 (90 Base) MCG/ACT inhaler Inhale 1-2 puffs into the lungs every 6 (six) hours as needed for shortness of breath. 10/01/19   [provider]  amLODipine (NORVASC) 5 MG tablet take 1 tablet by mouth once daily Patient taking differently: Take 5 mg by mouth at bedtime. 08/23/14   Debbrah Alar, NP  Butenafine HCl 1 % cream Apply twice a day to affected area 12/12/20   Pearson Forster, NP  cetirizine (ZYRTEC) 10 MG tablet Take 1 tablet (10 mg total) by mouth daily. Patient not taking: Reported on 12/12/2020 05/15/20   Loura Halt A, NP  Cholecalciferol  (VITAMIN D-3) 5000 units TABS Take 1 tablet by mouth daily.    [provider]  estradiol (ESTRACE) 2 MG tablet take 1 tablet by mouth once daily Patient taking differently: Take 2 mg by mouth daily.  08/23/14   Debbrah Alar, NP  lithium 300 MG tablet Take 300-600 mg by mouth See admin instructions. Take 392m in the morning and 6010mat bedtime.    [provider]  potassium chloride SA (KLOR-CON) 20 MEQ tablet Take 20 mEq by mouth daily. Patient not taking: Reported on 12/12/2020 01/27/20   [provider]  QUEtiapine (SEROQUEL) 300 MG tablet Take 300 mg by mouth at bedtime.    [provider]  triamcinolone cream (KENALOG) 0.1 % Apply 1 application topically 2 (two) times daily. Patient not taking: Reported on 12/12/2020 05/15/20   BaOrvan JulyNP    Allergies    Adhesive [tape] and Latex  Review of Systems   Review of Systems  Constitutional:  Positive for fatigue. Negative for chills and fever.  Respiratory:  Negative for shortness of breath.   Cardiovascular:  Negative for chest pain.  Gastrointestinal:  Negative for abdominal pain.  Musculoskeletal:  Negative for neck pain.  Skin:  Positive for rash.       Bilateral hand peeling skin  Neurological:  Negative for headaches.   Physical Exam Updated Vital Signs BP (!) 138/99   Pulse 81   Temp 97.9 F (36.6 C) (Oral)   Resp 14   Ht 5' 6"  (1.676 m)   Wt 77.1 kg   LMP 04/18/2010   SpO2 100%   BMI 27.44 kg/m   Physical Exam Vitals and nursing note reviewed.  Constitutional:      Appearance: Normal appearance.  HENT:     Head: Normocephalic.     Mouth/Throat:     Mouth: Mucous membranes are moist.  Cardiovascular:     Rate and Rhythm: Normal rate.  Pulmonary:     Effort: Pulmonary effort is normal. No respiratory distress.  Skin:    General: Skin is warm.     Comments: Bilateral hands on the palmar aspect are scaly, dry with peeling skin, some areas that have been irritated  and are raw.  No purulent discharge or drainage.  No involvement of the dorsal aspect of the hands.  There is a small area of dry rash on the right upper extremity and chest.  No pustules/vesicles, no petechiae.  No oral mucosal involvement.  Neurological:     Mental Status: She is alert and oriented to person, place, and time. Mental status is at baseline.  Psychiatric:        Mood and Affect: Mood normal.    ED Results /  Procedures / Treatments   Labs (all labs ordered are listed, but only abnormal results are displayed) Labs Reviewed  CBC WITH DIFFERENTIAL/PLATELET  BASIC METABOLIC PANEL  SEDIMENTATION RATE  C-REACTIVE PROTEIN    EKG None  Radiology No results found.  Procedures Procedures   Medications Ordered in ED Medications  morphine 4 MG/ML injection 4 mg (has no administration in time range)    ED Course  I have reviewed the triage vital signs and the nursing notes.  Pertinent labs & imaging results that were available during my care of the patient were reviewed by me and considered in my medical decision making (see chart for details).    MDM Rules/Calculators/A&P                          60 year old female presents emergency department with rash on her bilateral palms.  There is also a small area on her chest and right arm.  The rash appears dry, the skin is peeling, no vesicles/petechiae/drainage.  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  She is tried a couple different creams but does not seem like she is been consistent or fully compliant.  Blood work today is reassuring, CRP is negative, ESR normal.  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  No signs of systemic illness.  Patient will be discharged and treated as an outpatient.  Discharge plan and strict return to ED precautions discussed, patient verbalizes understanding and agreement.  Final  Clinical Impression(s) / ED Diagnoses Final diagnoses:  None    Rx / DC Orders ED Discharge Orders     None        Lorelle Gibbs, DO 02/15/21 1229    Lorelle Gibbs, DO 02/15/21 1248

## 2021-03-04 NOTE — Progress Notes (Signed)
Patient did not show for appointment.   

## 2021-03-05 ENCOUNTER — Encounter: Payer: Medicare Other | Admitting: Family

## 2021-03-27 ENCOUNTER — Encounter: Payer: Self-pay | Admitting: Physician Assistant

## 2021-04-30 ENCOUNTER — Ambulatory Visit: Payer: Medicare Other | Admitting: Orthopaedic Surgery

## 2021-04-30 ENCOUNTER — Ambulatory Visit: Payer: Medicare Other | Admitting: Physician Assistant

## 2021-05-21 ENCOUNTER — Ambulatory Visit: Payer: Medicare Other | Admitting: Orthopaedic Surgery

## 2021-06-27 ENCOUNTER — Encounter (INDEPENDENT_AMBULATORY_CARE_PROVIDER_SITE_OTHER): Payer: Medicare Other | Admitting: Ophthalmology

## 2021-06-27 NOTE — Progress Notes (Signed)
[facility] Note  06/28/2021     CHIEF COMPLAINT Patient presents for Retina Evaluation   HISTORY OF PRESENT ILLNESS: Lynn Gonzalez is a 60 y.o. female who presents to the clinic today for:   HPI     Retina Evaluation   In both eyes.  I, the attending physician,  performed the HPI with the patient and updated documentation appropriately.        Comments   Retina eval per Dr. Wyatt Portela for Retinal break OS- For months she has noticed FOLs.  Has happened multiple times.  Denies floaters or decrease in vision.  FB sensation, couldn't open her OD x2 days a couple of months ago.  Denies any trauma.  Still having the feeling in her eye but not as bad. Having a lot of headaches on the right side, OD is involved.          Last edited by Bernarda Caffey, MD on 06/28/2021 12:35 PM.    Pt is here on the referral of Dr. Katy Fitch for concern of retinal tear, she states she saw him for a routine eye exam, pt denies any hx of eye problems other than wearing glasses, she states about 2 months ago her eye started hurting like it had been scratched and she was unable to open it, pt states this still happens on occasion, just not as bad  Referring physician: Wyatt Portela, MD [address]  [place],  [address]  HISTORICAL INFORMATION:   Selected notes from the MEDICAL RECORD NUMBER Referred by Dr. Wyatt Portela LEE:  Ocular Hx- PMH-    CURRENT MEDICATIONS: No current outpatient medications on file. (Ophthalmic Drugs)   No current facility-administered medications for this visit. (Ophthalmic Drugs)   Current Outpatient Medications (Other)  Medication Sig   albuterol (VENTOLIN HFA) 108 (90 Base) MCG/ACT inhaler Inhale 1-2 puffs into the lungs every 6 (six) hours as needed for shortness of breath.   amLODipine (NORVASC) 5 MG tablet take 1 tablet by mouth once daily (Patient taking differently: Take 5 mg by mouth at bedtime.)   Butenafine HCl 1 %  cream Apply twice a day to affected area   Cholecalciferol (VITAMIN D-3) 5000 units TABS Take 1 tablet by mouth daily.   estradiol (ESTRACE) 2 MG tablet take 1 tablet by mouth once daily (Patient taking differently: Take 2 mg by mouth daily.)   lithium 300 MG tablet Take 300-600 mg by mouth See admin instructions. Take 300mg  in the morning and 600mg  at bedtime.   mupirocin cream (BACTROBAN) 2 % Apply 1 application topically 2 (two) times daily.   QUEtiapine (SEROQUEL) 300 MG tablet Take 300 mg by mouth at bedtime.   cetirizine (ZYRTEC) 10 MG tablet Take 1 tablet (10 mg total) by mouth daily. (Patient not taking: No sig reported)   potassium chloride SA (KLOR-CON) 20 MEQ tablet Take 20 mEq by mouth daily. (Patient not taking: No sig reported)   triamcinolone cream (KENALOG) 0.1 % Apply 1 application topically 2 (two) times daily. (Patient not taking: No sig reported)   No current facility-administered medications for this visit. (Other)   REVIEW OF SYSTEMS: ROS   Positive for: Gastrointestinal, Endocrine, Eyes, Respiratory, Psychiatric Negative for: Constitutional, Neurological, Skin, Genitourinary, Musculoskeletal, HENT, Cardiovascular, Allergic/Imm, Heme/Lymph Last edited by Leonie Douglas, COA on 06/28/2021 10:07 AM.     ALLERGIES Allergies  Allergen Reactions   Adhesive [Tape] Rash   Latex Rash   PAST MEDICAL  HISTORY Past Medical History:  Diagnosis Date   ANEMIA    Anxiety    Asthma    B12 DEFICIENCY    Bipolar 1 disorder ([place]) 1985   BREAST CYST, LEFT    Breast tumor    GERD (gastroesophageal reflux disease)    GOITER    H/O cardiovascular stress test    Lex MV 4/14:  No ischemia, EF 72%, normal wall motion   Hypertension    Hyperthyroidism    INTERSTITIAL LUNG DISEASE    Irritable bowel syndrome with diarrhea    Past Surgical History:  Procedure Laterality Date   ABDOMINAL HYSTERECTOMY  06/03/10   BUNIONECTOMY     bilateral   GALLBLADDER SURGERY  1985   stones  removed   HYDRADENITIS EXCISION Bilateral 05/20/2017   Procedure: EXCISION BILATERAL HIDRADENITIS AXILLA;  Surgeon: Erroll Luna, MD;  Location: [place];  Service: General;  Laterality: Bilateral;   KNEE SURGERY  1987   bilateral   LUMBAR LAMINECTOMY/DECOMPRESSION MICRODISCECTOMY Right 01/13/2013   Procedure: MICRODISCECTOMY FORAMINOTOMY OF L4-5 ON THE RIGHT;  Surgeon: Magnus Sinning, MD;  [facility];  Service: Orthopedics;  Laterality: Right;   MEDIASTINOSCOPY  2001   STERNOTOMY  2001   TONSILLECTOMY     TUBAL LIGATION  1985, 1996    FAMILY HISTORY Family History  Problem Relation Age of Onset   Glaucoma Mother    Diabetes Mother    Hypertension Mother    Cancer Father        ?type   Hypertension Father     SOCIAL HISTORY Social History   Tobacco Use   Smoking status: Never    Passive exposure: Never   Smokeless tobacco: Never  Vaping Use   Vaping Use: Never used  Substance Use Topics   Alcohol use: No   Drug use: No       OPHTHALMIC EXAM: Base Eye Exam     Visual Acuity (Snellen - Linear)       Right Left   Dist cc 20/25 20/25 -2   Dist ph cc 20/20 -2 NI    Correction: Glasses         Tonometry (Tonopen, 9:52 AM)       Right Left   Pressure 19 13         Pupils       Dark Light Shape React APD   Right 2 1 Round Minimal None   Left 2 1 Round Minimal None         Visual Fields (Counting fingers)       Left Right    Full Full         Extraocular Movement       Right Left    Full, Ortho Full, Ortho         Neuro/Psych     Oriented x3: Yes   Mood/Affect: Normal         Dilation     Both eyes: 1.0% Mydriacyl, 2.5% Phenylephrine @ 9:52 AM           Slit Lamp and Fundus Exam     External Exam       Right Left   External Normal Normal         Slit Lamp Exam       Right Left   Lids/Lashes Dermatochalasis - upper lid Dermatochalasis - upper lid   Conjunctiva/Sclera mild melanosis, temporal pinguecula  mild melanosis, temporal pinguecula   Cornea tear film debris tear  film debris   Anterior Chamber Deep and quiet Deep and quiet   Iris Round and dilated Round and dilated   Lens 2+ Nuclear sclerosis, 2+ Cortical cataract 2+ Nuclear sclerosis, 2+ Cortical cataract   Vitreous Vitreous syneresis Vitreous syneresis         Fundus Exam       Right Left   Disc Pink and Sharp Pink and Sharp   C/D Ratio 0.4 0.4   Macula Flat, Good foveal reflex, atrophic, pigmented CR scar temporal macula, No heme or edema Flat, Blunted foveal reflex, RPE mottling, No heme or edema   Vessels mild attenuation, mild tortuousity mild attenuation, mild tortuousity, mild Copper wiring   Periphery Attached, pigmented CR scat at 0230, White without pressure nasal and temporal, no new RT/RD Attached, 2 atrophic CR scars at 0300, CR scar at 0500 equator, mild WWP temporally, No RT/RD on 360 scleral depression            Refraction     Wearing Rx       Sphere Cylinder Axis Add   Right -4.00 +2.50 009 +2.00   Left -3.25 +2.25 006 +2.00         Manifest Refraction       Sphere Cylinder Axis Dist VA   Right -4.00 +2.00 010 20/25   Left -3.25 +2.25 170 20/25            IMAGING AND PROCEDURES  Imaging and Procedures for 06/28/2021  OCT, Retina - OU - Both Eyes       Right Eye Quality was good. Central Foveal Thickness: 266. Progression has no prior data. Findings include normal foveal contour, no IRF, no SRF, outer retinal atrophy, inner retinal atrophy, subretinal hyper-reflective material, vitreomacular adhesion (Atrophic CR scar temporal macula).   Left Eye Quality was good. Central Foveal Thickness: 255. Progression has no prior data. Findings include normal foveal contour, no IRF, no SRF, vitreomacular adhesion .   Notes *Images captured and stored on drive  Diagnosis / Impression:  OD: Atrophic CR scar temporal macula OS: NFP, no IRF/SRF; +VMA  Clinical management:  See  below  Abbreviations: NFP - Normal foveal profile. CME - cystoid macular edema. PED - pigment epithelial detachment. IRF - intraretinal fluid. SRF - subretinal fluid. EZ - ellipsoid zone. ERM - epiretinal membrane. ORA - outer retinal atrophy. ORT - outer retinal tubulation. SRHM - subretinal hyper-reflective material. 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 Retinal edema  H35.81 OCT, Retina - OU - Both Eyes    3. White without pressure of peripheral retina of both eyes  H35.9     4. Essential hypertension  I10     5. Hypertensive retinopathy of both eyes  H35.033     6. Combined forms of age-related cataract of both eyes  H25.813      1,2. Toxoplasmosis OU  - scattered focal pigmented atrophic CR scars OU -- OD with large lesion temporal macula  - don't appear to be active -- no heme or fluid surrounding  - discussed findings and likely chronic nature  - will check FA at next visit to r/o occult inflammation or associated pathologies  - f/u 6 weeks, DFE, OCT, FA (transit OD)  3. White w/o pressure OU  - no RT/RD OU -- OS checked with scleral depression  -  monitor  4,5. Hypertensive retinopathy OU - discussed importance of tight BP control - monitor  6. Mixed Cataract OU - The symptoms of cataract, surgical options, and treatments and risks were discussed with patient. - discussed diagnosis and progression - under the expert management of Dr. Wyatt Portela  Ophthalmic Meds Ordered this visit:  No orders of the defined types were placed in this encounter.      Return in about 6 weeks (around 08/09/2021) for f/u toxoplasmosis, DFE, OCT, FA (transit OD).  There are no Patient Instructions on file for this visit.   Explained the diagnoses, plan, and follow up with the patient and they expressed understanding.  Patient expressed understanding of the importance  of proper follow up care.   This document serves as a record of services personally performed by Gardiner Sleeper, MD, PhD. It was created on their behalf by Leonie Douglas, an ophthalmic technician. The creation of this record is the provider's dictation and/or activities during the visit.    Electronically signed by: Leonie Douglas COA, 06/28/21  12:35 PM  Gardiner Sleeper, M.D., Ph.D. Diseases & Surgery of the Retina and Vitreous [facility] 06/28/2021  I have reviewed the above documentation for accuracy and completeness, and I agree with the above. Gardiner Sleeper, M.D., Ph.D. 06/28/21 12:40 PM   Abbreviations: M myopia (nearsighted); A astigmatism; H hyperopia (farsighted); P presbyopia; Mrx spectacle prescription;  CTL contact lenses; OD right eye; OS left eye; OU both eyes  XT exotropia; ET esotropia; PEK punctate epithelial keratitis; PEE punctate epithelial erosions; DES dry eye syndrome; MGD meibomian gland dysfunction; ATs artificial tears; PFAT's preservative free artificial tears; [place] nuclear sclerotic cataract; PSC posterior subcapsular cataract; ERM epi-retinal membrane; PVD posterior vitreous detachment; RD retinal detachment; DM diabetes mellitus; DR diabetic retinopathy; NPDR non-proliferative diabetic retinopathy; PDR proliferative diabetic retinopathy; CSME clinically significant macular edema; DME diabetic macular edema; dbh dot blot hemorrhages; CWS cotton wool spot; POAG primary open angle glaucoma; C/D cup-to-disc ratio; HVF humphrey visual field; GVF goldmann visual field; OCT optical coherence tomography; IOP intraocular pressure; BRVO Branch retinal vein occlusion; CRVO central retinal vein occlusion; CRAO central retinal artery occlusion; BRAO branch retinal artery occlusion; RT retinal tear; SB scleral buckle; PPV pars plana vitrectomy; VH Vitreous hemorrhage; PRP panretinal laser photocoagulation; IVK intravitreal kenalog; VMT vitreomacular traction; MH  Macular hole;  NVD neovascularization of the disc; NVE neovascularization elsewhere; AREDS age related eye disease study; ARMD age related macular degeneration; POAG primary open angle glaucoma; EBMD epithelial/anterior basement membrane dystrophy; ACIOL anterior chamber intraocular lens; IOL intraocular lens; PCIOL posterior chamber intraocular lens; Phaco/IOL phacoemulsification with intraocular lens placement; [place] photorefractive keratectomy; LASIK laser assisted in situ keratomileusis; HTN hypertension; DM diabetes mellitus; COPD chronic obstructive pulmonary disease

## 2021-06-28 ENCOUNTER — Ambulatory Visit (INDEPENDENT_AMBULATORY_CARE_PROVIDER_SITE_OTHER): Payer: Medicare Other | Admitting: Ophthalmology

## 2021-06-28 ENCOUNTER — Other Ambulatory Visit: Payer: Self-pay

## 2021-06-28 ENCOUNTER — Encounter (INDEPENDENT_AMBULATORY_CARE_PROVIDER_SITE_OTHER): Payer: Self-pay | Admitting: Ophthalmology

## 2021-06-28 DIAGNOSIS — B5801 Toxoplasma chorioretinitis: Secondary | ICD-10-CM | POA: Diagnosis not present

## 2021-06-28 DIAGNOSIS — I10 Essential (primary) hypertension: Secondary | ICD-10-CM | POA: Diagnosis not present

## 2021-06-28 DIAGNOSIS — H35033 Hypertensive retinopathy, bilateral: Secondary | ICD-10-CM

## 2021-06-28 DIAGNOSIS — H3581 Retinal edema: Secondary | ICD-10-CM

## 2021-06-28 DIAGNOSIS — H359 Unspecified retinal disorder: Secondary | ICD-10-CM

## 2021-06-28 DIAGNOSIS — H25813 Combined forms of age-related cataract, bilateral: Secondary | ICD-10-CM

## 2021-07-25 NOTE — Progress Notes (Addendum)
[facility] Note  08/06/2021     CHIEF COMPLAINT Patient presents for Retina Follow Up    HISTORY OF PRESENT ILLNESS: Lynn Gonzalez is a 60 y.o. female who presents to the clinic today for:   HPI     Retina Follow Up   Patient presents with  Other.  In both eyes.  This started 6.  I, the attending physician,  performed the HPI with the patient and updated documentation appropriately.        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 Patient states vision doing alright. Vision os is kind of rough. Been bothering for about 3 weeks. Uses warm compresses.      Last edited by Bernarda Caffey, MD on 08/06/2021  9:33 PM.    Pt states her left eye has been bothering her a lot, it hurts most of the time, she does not use any AT's, but does use warm compresses   Referring physician: Wyatt Portela, MD [address]  [place], [place] [id number]  HISTORICAL INFORMATION:   Selected notes from the MEDICAL RECORD NUMBER Referred by Dr. Wyatt Portela   CURRENT MEDICATIONS: No current outpatient medications on file. (Ophthalmic Drugs)   No current facility-administered medications for this visit. (Ophthalmic Drugs)   Current Outpatient Medications (Other)  Medication Sig   albuterol (VENTOLIN HFA) 108 (90 Base) MCG/ACT inhaler Inhale 1-2 puffs into the lungs every 6 (six) hours as needed for shortness of breath.   amLODipine (NORVASC) 5 MG tablet take 1 tablet by mouth once daily (Patient taking differently: Take 5 mg by mouth at bedtime.)   Butenafine HCl 1 % cream Apply twice a day to affected area   Cholecalciferol (VITAMIN D-3) 5000 units TABS Take 1 tablet by mouth daily.   estradiol (ESTRACE) 2 MG tablet take 1 tablet by mouth once daily (Patient taking differently: Take 2 mg by mouth daily.)   lithium 300 MG tablet Take 300-600 mg by mouth See admin instructions. Take 300mg  in the morning and 600mg  at bedtime.    mupirocin cream (BACTROBAN) 2 % Apply 1 application topically 2 (two) times daily.   QUEtiapine (SEROQUEL) 300 MG tablet Take 300 mg by mouth at bedtime.   cetirizine (ZYRTEC) 10 MG tablet Take 1 tablet (10 mg total) by mouth daily. (Patient not taking: Reported on 12/12/2020)   potassium chloride SA (KLOR-CON) 20 MEQ tablet Take 20 mEq by mouth daily. (Patient not taking: Reported on 12/12/2020)   triamcinolone cream (KENALOG) 0.1 % Apply 1 application topically 2 (two) times daily. (Patient not taking: Reported on 12/12/2020)   No current facility-administered medications for this visit. (Other)   REVIEW OF SYSTEMS: ROS   Positive for: Gastrointestinal, Endocrine, Eyes, Respiratory, Psychiatric Negative for: Constitutional, Neurological, Skin, Genitourinary, Musculoskeletal, HENT, Cardiovascular, Allergic/Imm, Heme/Lymph Last edited by Theodore Demark, COA on 08/06/2021 10:03 AM.      ALLERGIES Allergies  Allergen Reactions   Adhesive [Tape] Rash   Latex Rash   PAST MEDICAL HISTORY Past Medical History:  Diagnosis Date   ANEMIA    Anxiety    Asthma    B12 DEFICIENCY    Bipolar 1 disorder ([place]) 1985   BREAST CYST, LEFT    Breast tumor    GERD (gastroesophageal reflux disease)    GOITER    H/O cardiovascular stress test    Lex MV 4/14:  No ischemia, EF 72%, normal wall  motion   Hypertension    Hyperthyroidism    INTERSTITIAL LUNG DISEASE    Irritable bowel syndrome with diarrhea    Past Surgical History:  Procedure Laterality Date   ABDOMINAL HYSTERECTOMY  06/03/10   BUNIONECTOMY     bilateral   GALLBLADDER SURGERY  1985   stones removed   HYDRADENITIS EXCISION Bilateral 05/20/2017   Procedure: EXCISION BILATERAL HIDRADENITIS AXILLA;  Surgeon: Erroll Luna, MD;  Location: [facility];  Service: General;  Laterality: Bilateral;   KNEE SURGERY  1987   bilateral   LUMBAR LAMINECTOMY/DECOMPRESSION MICRODISCECTOMY Right 01/13/2013   Procedure: MICRODISCECTOMY FORAMINOTOMY OF  L4-5 ON THE RIGHT;  Surgeon: Magnus Sinning, MD;  Location: [facility];  Service: Orthopedics;  Laterality: Right;   MEDIASTINOSCOPY  2001   STERNOTOMY  2001   TONSILLECTOMY     TUBAL LIGATION  1985, 1996    FAMILY HISTORY Family History  Problem Relation Age of Onset   Glaucoma Mother    Diabetes Mother    Hypertension Mother    Cancer Father        ?type   Hypertension Father     SOCIAL HISTORY Social History   Tobacco Use   Smoking status: Never    Passive exposure: Never   Smokeless tobacco: Never  Vaping Use   Vaping Use: Never used  Substance Use Topics   Alcohol use: No   Drug use: No       OPHTHALMIC EXAM: Base Eye Exam     Visual Acuity (Snellen - Linear)       Right Left   Dist cc 20/25 20/25 -2   Dist ph cc NI 20/25 +2    Correction: Glasses         Tonometry (Tonopen, 10:01 AM)       Right Left   Pressure 12 12         Pupils       Dark Light Shape React APD   Right 2 1 Round Minimal None   Left 2 1 Round Minimal None         Visual Fields (Counting fingers)       Left Right    Full Full         Extraocular Movement       Right Left    Full, Ortho Full, Ortho         Neuro/Psych     Oriented x3: Yes   Mood/Affect: Normal         Dilation     Both eyes: 1.0% Mydriacyl, 2.5% Phenylephrine @ 10:00 AM           Slit Lamp and Fundus Exam     External Exam       Right Left   External Normal Normal         Slit Lamp Exam       Right Left   Lids/Lashes Dermatochalasis - upper lid Dermatochalasis - upper lid   Conjunctiva/Sclera mild melanosis, temporal pinguecula mild melanosis, temporal pinguecula   Cornea tear film debris tear film debris   Anterior Chamber Deep and quiet Deep and quiet   Iris Round and dilated Round and dilated   Lens 2+ Nuclear sclerosis, 2+ Cortical cataract 2+ Nuclear sclerosis, 2+ Cortical cataract   Anterior Vitreous Vitreous syneresis Vitreous syneresis         Fundus  Exam       Right Left   Disc Pink and Sharp Pink and Sharp  C/D Ratio 0.4 0.4   Macula Flat, Good foveal reflex, atrophic, pigmented, fibrotic CR scar temporal macula, No heme or edema Flat, Blunted foveal reflex, RPE mottling, No heme or edema   Vessels mild attenuation, mild tortuousity mild attenuation, mild tortuousity, mild Copper wiring   Periphery Attached, pigmented CR scat at 0230, White without pressure nasal and temporal, no RT/RD Attached, 2 atrophic CR scars at 0300, CR scar at 0500 equator, mild WWP temporally, no heme, No RT/RD           Refraction     Wearing Rx       Sphere Cylinder Axis Add   Right -4.00 +2.50 009 +2.00   Left -3.25 +2.25 006 +2.00            IMAGING AND PROCEDURES  Imaging and Procedures for 08/06/2021  OCT, Retina - OU - Both Eyes       Right Eye Quality was good. Central Foveal Thickness: 267. Progression has been stable. Findings include normal foveal contour, no IRF, no SRF, outer retinal atrophy, inner retinal atrophy, subretinal hyper-reflective material, vitreomacular adhesion (Atrophic CR scar temporal macula - stable from prior).   Left Eye Quality was good. Central Foveal Thickness: 254. Progression has been stable. Findings include normal foveal contour, no IRF, no SRF, vitreomacular adhesion .   Notes *Images captured and stored on drive  Diagnosis / Impression:  OD: Atrophic CR scar temporal macula OS: NFP, no IRF/SRF; +VMA  Clinical management:  See below  Abbreviations: NFP - Normal foveal profile. CME - cystoid macular edema. PED - pigment epithelial detachment. IRF - intraretinal fluid. SRF - subretinal fluid. EZ - ellipsoid zone. ERM - epiretinal membrane. ORA - outer retinal atrophy. ORT - outer retinal tubulation. SRHM - subretinal hyper-reflective material. IRHM - intraretinal hyper-reflective material      Fluorescein Angiography Optos (Transit OD)       Right Eye Progression has no prior data. Early  phase findings include window defect. Mid/Late phase findings include staining, window defect (Focal staining within window defects temporal macula and nasal periphery, no CNV).   Left Eye Progression has no prior data. Early phase findings include window defect, staining. Mid/Late phase findings include staining (Focal staining within window defects temporal and inferior periphery, no CNV).   Notes **Images stored on drive**  Impression: Toxoplasmosis with focal CR scarring and staining OU No CNV or leakage OU            ASSESSMENT/PLAN:   ICD-10-CM   1. Toxoplasmosis chorioretinitis of both eyes  B58.01 OCT, Retina - OU - Both Eyes    2. White without pressure of peripheral retina of both eyes  H35.9     3. Essential hypertension  I10     4. Hypertensive retinopathy of both eyes  H35.033 Fluorescein Angiography Optos (Transit OD)    5. Combined forms of age-related cataract of both eyes  H25.813      1. Toxoplasmosis OU  - scattered focal pigmented atrophic CR scars OU -- OD with large lesion temporal macula  - no heme or fluid surrounding  - FA (12.20.22) shows toxoplasmosis with focal CR scarring and staining OU; No CNV or leakage OU - discussed findings and likely chronic nature - no retinal or ophthalmic interventions indicated or recommended   - f/u 6 months, DFE, OCT  2. White w/o pressure OU  - no RT/RD OU -- OS checked with scleral depression  - monitor  3,4. Hypertensive retinopathy OU - discussed  importance of tight BP control - monitor  5. Mixed Cataract OU - The symptoms of cataract, surgical options, and treatments and risks were discussed with patient. - discussed diagnosis and progression - under the expert management of Dr. Wyatt Portela  Ophthalmic Meds Ordered this visit:  No orders of the defined types were placed in this encounter.    Return in about 6 months (around 02/04/2022) for f/u toxoplasmosis OU, DFE, OCT.  There are no Patient  Instructions on file for this visit.   Explained the diagnoses, plan, and follow up with the patient and they expressed understanding.  Patient expressed understanding of the importance of proper follow up care.   This document serves as a record of services personally performed by Gardiner Sleeper, MD, PhD. It was created on their behalf by Estill Bakes, COT an ophthalmic technician. The creation of this record is the provider's dictation and/or activities during the visit.    Electronically signed by: Estill Bakes, COT 12.8.22 @ 9:40 PM   Gardiner Sleeper, M.D., Ph.D. Diseases & Surgery of the Retina and Vitreous [facility]  I have reviewed the above documentation for accuracy and completeness, and I agree with the above. Gardiner Sleeper, M.D., Ph.D. 08/06/21 9:40 PM   Abbreviations: M myopia (nearsighted); A astigmatism; H hyperopia (farsighted); P presbyopia; Mrx spectacle prescription;  CTL contact lenses; OD right eye; OS left eye; OU both eyes  XT exotropia; ET esotropia; PEK punctate epithelial keratitis; PEE punctate epithelial erosions; DES dry eye syndrome; MGD meibomian gland dysfunction; ATs artificial tears; PFAT's preservative free artificial tears; [place] nuclear sclerotic cataract; PSC posterior subcapsular cataract; ERM epi-retinal membrane; PVD posterior vitreous detachment; RD retinal detachment; DM diabetes mellitus; DR diabetic retinopathy; NPDR non-proliferative diabetic retinopathy; PDR proliferative diabetic retinopathy; CSME clinically significant macular edema; DME diabetic macular edema; dbh dot blot hemorrhages; CWS cotton wool spot; POAG primary open angle glaucoma; C/D cup-to-disc ratio; HVF humphrey visual field; GVF goldmann visual field; OCT optical coherence tomography; IOP intraocular pressure; BRVO Branch retinal vein occlusion; CRVO central retinal vein occlusion; CRAO central retinal artery occlusion; BRAO branch retinal artery occlusion;  RT retinal tear; SB scleral buckle; PPV pars plana vitrectomy; VH Vitreous hemorrhage; PRP panretinal laser photocoagulation; IVK intravitreal kenalog; VMT vitreomacular traction; MH Macular hole;  NVD neovascularization of the disc; NVE neovascularization elsewhere; AREDS age related eye disease study; ARMD age related macular degeneration; POAG primary open angle glaucoma; EBMD epithelial/anterior basement membrane dystrophy; ACIOL anterior chamber intraocular lens; IOL intraocular lens; PCIOL posterior chamber intraocular lens; Phaco/IOL phacoemulsification with intraocular lens placement; [place] photorefractive keratectomy; LASIK laser assisted in situ keratomileusis; HTN hypertension; DM diabetes mellitus; COPD chronic obstructive pulmonary disease

## 2021-08-06 ENCOUNTER — Other Ambulatory Visit: Payer: Self-pay

## 2021-08-06 ENCOUNTER — Encounter (INDEPENDENT_AMBULATORY_CARE_PROVIDER_SITE_OTHER): Payer: Self-pay | Admitting: Ophthalmology

## 2021-08-06 ENCOUNTER — Ambulatory Visit (INDEPENDENT_AMBULATORY_CARE_PROVIDER_SITE_OTHER): Payer: Medicare Other | Admitting: Ophthalmology

## 2021-08-06 DIAGNOSIS — H35033 Hypertensive retinopathy, bilateral: Secondary | ICD-10-CM | POA: Diagnosis not present

## 2021-08-06 DIAGNOSIS — I10 Essential (primary) hypertension: Secondary | ICD-10-CM | POA: Diagnosis not present

## 2021-08-06 DIAGNOSIS — B5801 Toxoplasma chorioretinitis: Secondary | ICD-10-CM | POA: Diagnosis not present

## 2021-08-06 DIAGNOSIS — H359 Unspecified retinal disorder: Secondary | ICD-10-CM

## 2021-08-06 DIAGNOSIS — H25813 Combined forms of age-related cataract, bilateral: Secondary | ICD-10-CM

## 2021-08-06 DIAGNOSIS — H3581 Retinal edema: Secondary | ICD-10-CM

## 2021-08-27 ENCOUNTER — Ambulatory Visit: Payer: Medicare Other | Admitting: Family Medicine

## 2021-09-18 ENCOUNTER — Other Ambulatory Visit: Payer: Self-pay

## 2021-09-18 ENCOUNTER — Ambulatory Visit (HOSPITAL_COMMUNITY)
Admission: EM | Admit: 2021-09-18 | Discharge: 2021-09-18 | Disposition: A | Discharge status: Home / Self Care | Payer: Commercial Managed Care - HMO | Attending: Physician Assistant | Admitting: Physician Assistant

## 2021-09-18 ENCOUNTER — Encounter (HOSPITAL_COMMUNITY): Payer: Self-pay

## 2021-09-18 DIAGNOSIS — L089 Local infection of the skin and subcutaneous tissue, unspecified: Secondary | ICD-10-CM

## 2021-09-18 DIAGNOSIS — L729 Follicular cyst of the skin and subcutaneous tissue, unspecified: Secondary | ICD-10-CM

## 2021-09-18 MED ORDER — DOXYCYCLINE HYCLATE 100 MG PO CAPS: 100.0000 mg | ORAL_CAPSULE | Freq: Two times a day (BID) | ORAL | 0 refills | Status: DC

## 2021-09-18 MED ORDER — LIDOCAINE-EPINEPHRINE 1 %-1:100000 IJ SOLN
INTRAMUSCULAR | Status: AC
  Filled 2021-09-18: qty 1

## 2021-09-18 MED ORDER — MUPIROCIN 2 % EX OINT: 1.0000 "application " | TOPICAL_OINTMENT | Freq: Every day | CUTANEOUS | 0 refills | Status: DC

## 2021-09-18 NOTE — ED Provider Notes (Signed)
[facility]    CSN: [id number] Arrival date & time: 09/18/21  0626      History   Chief Complaint Chief Complaint  Patient presents with   Abscess    HPI Lynn Gonzalez is a 61 y.o. female.   Patient presents today with a 36-month history of painful lesion in her gluteal cleft.  She reports that this is gradually been growing and has become more painful.  Pain is rated 6 on a 0-10 pain scale, localized to affected area, described as aching, worse with palpation, no alleviating factors identified.  She denies history of pilonidal abscess or recurrent skin infections.  Denies any recent antibiotics.  She denies any fever, nausea, vomiting, chest pain, shortness of breath.  She has tried warm compresses on affected area without improvement of symptoms.  Denies any drainage from the lesion.   Past Medical History:  Diagnosis Date   ANEMIA    Anxiety    Asthma    B12 DEFICIENCY    Bipolar 1 disorder ([place]) 1985   BREAST CYST, LEFT    Breast tumor    GERD (gastroesophageal reflux disease)    GOITER    H/O cardiovascular stress test    Lex MV 4/14:  No ischemia, EF 72%, normal wall motion   Hypertension    Hyperthyroidism    INTERSTITIAL LUNG DISEASE    Irritable bowel syndrome with diarrhea     Patient Active Problem List   Diagnosis Date Noted   Chronic diarrhea 12/14/2018   Paresthesias 06/25/2016   Prolonged Q-T interval on ECG 06/25/2016   Hypertension 10/24/2013   Hip pain 06/21/2013   Snoring 04/07/2012   GERD (gastroesophageal reflux disease) 01/22/2012   Gout 03/12/2011   GOITER 10/21/2010   BREAST CYST, LEFT 07/23/2010   B12 DEFICIENCY 03/08/2010   Bipolar 1 disorder ([place]) 03/08/2010   HYPERTHYROIDISM 02/11/2010   ANEMIA 02/11/2010    Past Surgical History:  Procedure Laterality Date   ABDOMINAL HYSTERECTOMY  06/03/10   BUNIONECTOMY     bilateral   GALLBLADDER SURGERY  1985   stones removed   HYDRADENITIS EXCISION Bilateral 05/20/2017    Procedure: EXCISION BILATERAL HIDRADENITIS AXILLA;  Surgeon: Erroll Luna, MD;  Location: [facility];  Service: General;  Laterality: Bilateral;   KNEE SURGERY  1987   bilateral   LUMBAR LAMINECTOMY/DECOMPRESSION MICRODISCECTOMY Right 01/13/2013   Procedure: MICRODISCECTOMY FORAMINOTOMY OF L4-5 ON THE RIGHT;  Surgeon: Magnus Sinning, MD;  Location: [facility];  Service: Orthopedics;  Laterality: Right;   MEDIASTINOSCOPY  2001   STERNOTOMY  2001   [place]    OB History   No obstetric history on file.      Home Medications    Prior to Admission medications   Medication Sig Start Date End Date Taking? Authorizing Provider  doxycycline (VIBRAMYCIN) 100 MG capsule Take 1 capsule (100 mg total) by mouth 2 (two) times daily. 09/18/21  Yes Jenniah Bhavsar, Junie Panning K, PA-C  mupirocin ointment (BACTROBAN) 2 % Apply 1 application topically daily. 09/18/21  Yes Keilany Burnette K, PA-C  albuterol (VENTOLIN HFA) 108 (90 Base) MCG/ACT inhaler Inhale 1-2 puffs into the lungs every 6 (six) hours as needed for shortness of breath. 10/01/19   [provider]  amLODipine (NORVASC) 5 MG tablet take 1 tablet by mouth once daily Patient taking differently: Take 5 mg by mouth at bedtime. 08/23/14   Debbrah Alar, NP  Butenafine HCl 1 % cream  Apply twice a day to affected area 12/12/20   Pearson Forster, NP  cetirizine (ZYRTEC) 10 MG tablet Take 1 tablet (10 mg total) by mouth daily. Patient not taking: Reported on 12/12/2020 05/15/20   Loura Halt A, NP  Cholecalciferol (VITAMIN D-3) 5000 units TABS Take 1 tablet by mouth daily.    [provider]  estradiol (ESTRACE) 2 MG tablet take 1 tablet by mouth once daily Patient taking differently: Take 2 mg by mouth daily. 08/23/14   Debbrah Alar, NP  lithium 300 MG tablet Take 300-600 mg by mouth See admin instructions. Take 300mg  in the morning and 600mg  at bedtime.    [provider]  potassium chloride SA  (KLOR-CON) 20 MEQ tablet Take 20 mEq by mouth daily. Patient not taking: Reported on 12/12/2020 01/27/20   [provider]  QUEtiapine (SEROQUEL) 300 MG tablet Take 300 mg by mouth at bedtime.    [provider]  triamcinolone cream (KENALOG) 0.1 % Apply 1 application topically 2 (two) times daily. Patient not taking: Reported on 12/12/2020 05/15/20   Orvan July, NP    Family History Family History  Problem Relation Age of Onset   Glaucoma Mother    Diabetes Mother    Hypertension Mother    Cancer Father        ?type   Hypertension Father     Social History Social History   Tobacco Use   Smoking status: Never    Passive exposure: Never   Smokeless tobacco: Never  Vaping Use   Vaping Use: Never used  Substance Use Topics   Alcohol use: No   Drug use: No     Allergies   Adhesive [tape] and Latex   Review of Systems Review of Systems  Constitutional:  Negative for activity change, appetite change, fatigue and fever.  Respiratory:  Negative for cough and shortness of breath.   Cardiovascular:  Negative for chest pain.  Gastrointestinal:  Negative for abdominal pain, diarrhea, nausea and vomiting.  Neurological:  Negative for dizziness, light-headedness and headaches.    Physical Exam Triage Vital Signs ED Triage Vitals  Enc Vitals Group     BP 09/18/21 0926 129/90     Pulse Rate 09/18/21 0926 74     Resp 09/18/21 0926 (!) 74     Temp 09/18/21 0927 98.7 F (37.1 C)     Temp Source 09/18/21 0927 Oral     SpO2 09/18/21 0926 93 %     Weight --      Height --      Head Circumference --      Peak Flow --      Pain Score 09/18/21 0926 3     Pain Loc --      Pain Edu? --      Excl. in [place]? --    No data found.  Updated Vital Signs BP 129/90 (BP Location: Left Arm)    Pulse 74    Temp 98.7 F (37.1 C) (Oral)    Resp 17    LMP 04/18/2010    SpO2 93%   Visual Acuity Right Eye Distance:   Left Eye Distance:   Bilateral Distance:    Right  Eye Near:   Left Eye Near:    Bilateral Near:     Physical Exam Vitals reviewed.  Constitutional:      General: She is awake. She is not in acute distress.    Appearance: Normal appearance. She is well-developed. She  is not ill-appearing.     Comments: Very pleasant female appears stated age in no acute distress sitting comfortably in exam room  HENT:     Head: Normocephalic and atraumatic.  Cardiovascular:     Rate and Rhythm: Normal rate and regular rhythm.     Heart sounds: Normal heart sounds, S1 normal and S2 normal. No murmur heard. Pulmonary:     Effort: Pulmonary effort is normal.     Breath sounds: Normal breath sounds. No wheezing, rhonchi or rales.     Comments: Clear to auscultation bilaterally Abdominal:     Palpations: Abdomen is soft.     Tenderness: There is no abdominal tenderness.  Skin:    Findings: Lesion present.     Comments: 1 cm x 1 cm well-defined lesion noted left gluteal cleft.  No streaking or evidence of lymphangitis.  No bleeding or drainage noted.  Psychiatric:        Behavior: Behavior is cooperative.     UC Treatments / Results  Labs (all labs ordered are listed, but only abnormal results are displayed) Labs Reviewed - No data to display  EKG   Radiology No results found.  Procedures Incision and Drainage  Date/Time: 09/18/2021 10:13 AM Performed by: Terrilee Croak, PA-C Authorized by: Terrilee Croak, PA-C   Consent:    Consent obtained:  Verbal   Consent given by:  Patient   Risks, benefits, and alternatives were discussed: yes     Risks discussed:  Incomplete drainage, damage to other organs and infection   Alternatives discussed:  Alternative treatment, observation and referral Universal protocol:    Procedure explained and questions answered to patient or proxy's satisfaction: yes     Patient identity confirmed:  Verbally with patient Location:    Type:  Cyst   Size:  1 cm x 1 cm   Location:  Anogenital   Anogenital  location:  Gluteal cleft Pre-procedure details:    Skin preparation:  Chlorhexidine with alcohol Sedation:    Sedation type:  None Anesthesia:    Anesthesia method:  Local infiltration   Local anesthetic:  Lidocaine 2% WITH epi Procedure type:    Complexity:  Simple Procedure details:    Ultrasound guidance: no     Needle aspiration: no     Incision types:  Stab incision   Incision depth:  Dermal   Wound management:  Probed and deloculated   Drainage:  Serous   Drainage amount:  Scant   Wound treatment:  Wound left open   Packing materials:  None Post-procedure details:    Procedure completion:  Tolerated (including critical care time)  Medications Ordered in UC Medications - No data to display  Initial Impression / Assessment and Plan / UC Course  I have reviewed the triage vital signs and the nursing notes.  Pertinent labs & imaging results that were available during my care of the patient were reviewed by me and considered in my medical decision making (see chart for details).     Discussed that given minimal fluctuance it would be reasonable to defer I&D but patient reported she is anxious to have this improve and requested procedure be attempted.  Able to drain minimal serous fluid without significant purulence.  Patient did report improvement of symptoms with procedure.  See procedure note above.  Discussed that she may benefit from seeing a surgeon to have cyst removed if this continues to be a problem and was given contact information for local provider.  Given enlarging and painful lesion will cover for infection with doxycycline 100 mg twice daily.  She was encouraged to keep area clean and apply Bactroban ointment with dressing changes.  Can use Tylenol and ibuprofen for pain.  Discussed alarm symptoms that warrant emergent evaluation including fever, increased pain, enlarging lesion, change in character of drainage.  Strict return precautions given to which she expressed  understanding.  Final Clinical Impressions(s) / UC Diagnoses   Final diagnoses:  Infected cyst of skin     Discharge Instructions      Start doxycycline 100 mg twice daily to cover for infection.  Use Bactroban with dressing changes.  Keep area clean by washing with soap and water.  Follow-up with surgeon if symptoms do not resolve.  If you develop any worsening symptoms including fever, increased pain, enlarging lesion, change in character of drainage you need to be seen immediately.     ED Prescriptions     Medication Sig Dispense Auth. Provider   doxycycline (VIBRAMYCIN) 100 MG capsule Take 1 capsule (100 mg total) by mouth 2 (two) times daily. 20 capsule T[place] Scheidegger K, PA-C   mupirocin ointment (BACTROBAN) 2 % Apply 1 application topically daily. 22 g Lydia Meng K, PA-C      PDMP not reviewed this encounter.   Terrilee Croak, PA-C 09/18/21 1014

## 2021-09-18 NOTE — Discharge Instructions (Addendum)
Start doxycycline 100 mg twice daily to cover for infection.  Use Bactroban with dressing changes.  Keep area clean by washing with soap and water.  Follow-up with surgeon if symptoms do not resolve.  If you develop any worsening symptoms including fever, increased pain, enlarging lesion, change in character of drainage you need to be seen immediately.

## 2021-09-18 NOTE — ED Triage Notes (Signed)
Pt presents with c/o an abscess x 2 months. States she has tried to drain and could not.

## 2021-10-25 ENCOUNTER — Ambulatory Visit: Payer: Self-pay | Admitting: Nurse Practitioner

## 2021-12-20 ENCOUNTER — Other Ambulatory Visit: Payer: Self-pay | Admitting: Family Medicine

## 2021-12-20 ENCOUNTER — Ambulatory Visit
Admission: RE | Admit: 2021-12-20 | Discharge: 2021-12-20 | Disposition: A | Discharge status: Home / Self Care | Payer: Medicare Other | Source: Ambulatory Visit | Attending: Family Medicine | Admitting: Family Medicine

## 2021-12-20 DIAGNOSIS — R52 Pain, unspecified: Secondary | ICD-10-CM

## 2022-02-04 ENCOUNTER — Encounter (INDEPENDENT_AMBULATORY_CARE_PROVIDER_SITE_OTHER): Payer: Medicare Other | Admitting: Ophthalmology

## 2022-02-04 DIAGNOSIS — H35033 Hypertensive retinopathy, bilateral: Secondary | ICD-10-CM

## 2022-02-04 DIAGNOSIS — H359 Unspecified retinal disorder: Secondary | ICD-10-CM

## 2022-02-04 DIAGNOSIS — B5801 Toxoplasma chorioretinitis: Secondary | ICD-10-CM

## 2022-02-04 DIAGNOSIS — H25813 Combined forms of age-related cataract, bilateral: Secondary | ICD-10-CM

## 2022-02-04 DIAGNOSIS — I10 Essential (primary) hypertension: Secondary | ICD-10-CM

## 2022-03-17 ENCOUNTER — Other Ambulatory Visit: Payer: Self-pay | Admitting: Internal Medicine

## 2022-03-17 DIAGNOSIS — Z1231 Encounter for screening mammogram for malignant neoplasm of breast: Secondary | ICD-10-CM

## 2022-04-17 ENCOUNTER — Emergency Department (HOSPITAL_COMMUNITY)
Admission: EM | Admit: 2022-04-17 | Discharge: 2022-04-17 | Disposition: A | Discharge status: Home / Self Care | Payer: Medicare Other | Attending: Emergency Medicine | Admitting: Emergency Medicine

## 2022-04-17 ENCOUNTER — Encounter (HOSPITAL_COMMUNITY): Payer: Self-pay | Admitting: Emergency Medicine

## 2022-04-17 ENCOUNTER — Other Ambulatory Visit: Payer: Self-pay

## 2022-04-17 ENCOUNTER — Emergency Department (HOSPITAL_COMMUNITY): Payer: Medicare Other

## 2022-04-17 DIAGNOSIS — Y92410 Unspecified street and highway as the place of occurrence of the external cause: Secondary | ICD-10-CM | POA: Insufficient documentation

## 2022-04-17 DIAGNOSIS — Z91040 Latex allergy status: Secondary | ICD-10-CM | POA: Insufficient documentation

## 2022-04-17 DIAGNOSIS — M25512 Pain in left shoulder: Secondary | ICD-10-CM | POA: Diagnosis present

## 2022-04-17 MED ORDER — ACETAMINOPHEN 500 MG PO TABS
1000.0000 mg | ORAL_TABLET | Freq: Once | ORAL | Status: AC
  Administered 2022-04-17: 1000 mg via ORAL
  Filled 2022-04-17: qty 2
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
## 2022-04-22 ENCOUNTER — Ambulatory Visit (INDEPENDENT_AMBULATORY_CARE_PROVIDER_SITE_OTHER): Payer: Medicare Other

## 2022-04-22 ENCOUNTER — Ambulatory Visit (INDEPENDENT_AMBULATORY_CARE_PROVIDER_SITE_OTHER): Payer: Medicare Other | Admitting: Sports Medicine

## 2022-04-22 ENCOUNTER — Encounter: Payer: Self-pay | Admitting: Sports Medicine

## 2022-04-22 ENCOUNTER — Telehealth: Payer: Self-pay | Admitting: Radiology

## 2022-04-22 VITALS — BP 155/95 | HR 80 | Ht 65.0 in | Wt 179.0 lb

## 2022-04-22 DIAGNOSIS — M25552 Pain in left hip: Secondary | ICD-10-CM | POA: Diagnosis not present

## 2022-04-22 DIAGNOSIS — M542 Cervicalgia: Secondary | ICD-10-CM

## 2022-04-22 DIAGNOSIS — M25512 Pain in left shoulder: Secondary | ICD-10-CM | POA: Diagnosis not present

## 2022-04-22 MED ORDER — MELOXICAM 7.5 MG PO TABS: 7.5000 mg | ORAL_TABLET | Freq: Every day | ORAL | 0 refills | Status: DC

## 2022-04-22 MED ORDER — CYCLOBENZAPRINE HCL 10 MG PO TABS: 5.0000 mg | ORAL_TABLET | Freq: Every day | ORAL | 0 refills | Status: DC
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
## 2022-05-01 ENCOUNTER — Encounter: Payer: Self-pay | Admitting: Physical Therapy

## 2022-05-01 ENCOUNTER — Ambulatory Visit (INDEPENDENT_AMBULATORY_CARE_PROVIDER_SITE_OTHER): Payer: Medicare Other | Admitting: Physical Therapy

## 2022-05-01 ENCOUNTER — Other Ambulatory Visit: Payer: Self-pay

## 2022-05-01 DIAGNOSIS — M6281 Muscle weakness (generalized): Secondary | ICD-10-CM

## 2022-05-01 DIAGNOSIS — M25512 Pain in left shoulder: Secondary | ICD-10-CM | POA: Diagnosis not present
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
## 2022-05-07 ENCOUNTER — Ambulatory Visit: Payer: Medicare Other

## 2022-05-07 ENCOUNTER — Other Ambulatory Visit: Payer: Self-pay | Admitting: Sports Medicine

## 2022-05-07 ENCOUNTER — Encounter: Payer: Self-pay | Admitting: Physical Therapy

## 2022-05-07 ENCOUNTER — Ambulatory Visit (INDEPENDENT_AMBULATORY_CARE_PROVIDER_SITE_OTHER): Payer: Medicare Other | Admitting: Physical Therapy

## 2022-05-07 DIAGNOSIS — M6281 Muscle weakness (generalized): Secondary | ICD-10-CM | POA: Diagnosis not present

## 2022-05-07 DIAGNOSIS — M25552 Pain in left hip: Secondary | ICD-10-CM

## 2022-05-07 DIAGNOSIS — M25512 Pain in left shoulder: Secondary | ICD-10-CM | POA: Diagnosis not present
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
## 2022-05-09 ENCOUNTER — Encounter: Payer: Medicare Other | Admitting: Physical Therapy

## 2022-05-14 ENCOUNTER — Ambulatory Visit (INDEPENDENT_AMBULATORY_CARE_PROVIDER_SITE_OTHER): Payer: Medicare Other | Admitting: Physical Therapy

## 2022-05-14 ENCOUNTER — Encounter: Payer: Self-pay | Admitting: Physical Therapy

## 2022-05-14 DIAGNOSIS — M6281 Muscle weakness (generalized): Secondary | ICD-10-CM

## 2022-05-14 DIAGNOSIS — M25552 Pain in left hip: Secondary | ICD-10-CM | POA: Diagnosis not present

## 2022-05-14 DIAGNOSIS — M25512 Pain in left shoulder: Secondary | ICD-10-CM | POA: Diagnosis not present
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
## 2022-05-16 ENCOUNTER — Ambulatory Visit (INDEPENDENT_AMBULATORY_CARE_PROVIDER_SITE_OTHER): Payer: Medicare Other | Admitting: Physical Therapy

## 2022-05-16 ENCOUNTER — Encounter: Payer: Self-pay | Admitting: Physical Therapy

## 2022-05-16 DIAGNOSIS — M25552 Pain in left hip: Secondary | ICD-10-CM | POA: Diagnosis not present

## 2022-05-16 DIAGNOSIS — M6281 Muscle weakness (generalized): Secondary | ICD-10-CM | POA: Diagnosis not present

## 2022-05-16 DIAGNOSIS — M25512 Pain in left shoulder: Secondary | ICD-10-CM | POA: Diagnosis not present
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
## 2022-05-21 ENCOUNTER — Encounter: Payer: Self-pay | Admitting: Physical Therapy

## 2022-05-21 ENCOUNTER — Ambulatory Visit (INDEPENDENT_AMBULATORY_CARE_PROVIDER_SITE_OTHER): Payer: Medicare Other | Admitting: Physical Therapy

## 2022-05-21 DIAGNOSIS — M25512 Pain in left shoulder: Secondary | ICD-10-CM

## 2022-05-21 DIAGNOSIS — M6281 Muscle weakness (generalized): Secondary | ICD-10-CM | POA: Diagnosis not present

## 2022-05-21 DIAGNOSIS — M25552 Pain in left hip: Secondary | ICD-10-CM
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
## 2022-05-23 ENCOUNTER — Encounter: Payer: Medicare Other | Admitting: Physical Therapy

## 2022-05-26 ENCOUNTER — Ambulatory Visit (INDEPENDENT_AMBULATORY_CARE_PROVIDER_SITE_OTHER): Payer: Medicare Other | Admitting: Sports Medicine

## 2022-05-26 ENCOUNTER — Ambulatory Visit: Payer: Self-pay

## 2022-05-26 ENCOUNTER — Encounter: Payer: Self-pay | Admitting: Sports Medicine

## 2022-05-26 DIAGNOSIS — M25512 Pain in left shoulder: Secondary | ICD-10-CM

## 2022-05-26 DIAGNOSIS — M25552 Pain in left hip: Secondary | ICD-10-CM | POA: Diagnosis not present

## 2022-05-26 DIAGNOSIS — M7062 Trochanteric bursitis, left hip: Secondary | ICD-10-CM | POA: Diagnosis not present

## 2022-05-26 MED ORDER — METHYLPREDNISOLONE ACETATE 40 MG/ML IJ SUSP
40.0000 mg | INTRAMUSCULAR | Status: AC | PRN
  Administered 2022-05-26: 40 mg via INTRA_ARTICULAR

## 2022-05-26 MED ORDER — LIDOCAINE HCL 1 % IJ SOLN
2.0000 mL | INTRAMUSCULAR | Status: AC | PRN
  Administered 2022-05-26: 2 mL

## 2022-05-26 MED ORDER — BUPIVACAINE HCL 0.25 % IJ SOLN
2.0000 mL | INTRAMUSCULAR | Status: AC | PRN
  Administered 2022-05-26: 2 mL via INTRA_ARTICULAR

## 2022-05-26 NOTE — Patient Instructions (Signed)
Ann, it was great to see you today, thank you for letting me participate in your care.  Today, we discussed your left hip pain and your left shoulder pain.  Today we gave you a corticosteroid injection into the left lateral hip, greater trochanteric bursa region.  The numbing agent will wear off later tonight, you may have some increased soreness or pain around the hip for the next day or 2.  If this happens, place ice over the hip and you may take Aleve or ibuprofen.  The medicine should kick in in the next 2-3 days and you should be feeling better at that point.  Continue your home exercises and therapy as indicated.  I would recommend avoiding walking long distances and going up and down steps as much as possible for the next 2 days.  Starting on Wednesday you may get back to normal activity.  You will follow-up with me in 2 weeks and we may consider doing an injection into the left shoulder.  If you have any further questions, please give the clinic a call [phone].  Elba Barman, DO Primary Care Sports Medicine Physician  [place]

## 2022-05-26 NOTE — Progress Notes (Signed)
Lynn Gonzalez - 61 y.o. female MRN [id number]  Date of birth: 1961/06/28  Office Visit Note: Visit Date: 05/26/2022 PCP: Pcp, No Referred by: No ref. provider found  Subjective: Chief Complaint  Patient presents with   Left Hip - Follow-up, Pain   Left Shoulder - Pain   HPI: Lynn Gonzalez is a pleasant 61 y.o. female who presents today for left shoulder and left hip pain.   She is still in quite a bit of pain.  She has done a handful of physical therapy sessions and does note that she and the therapist have noticed some improvements in her strength, however her pain is still bothering her.  The pain in the shoulder and the hip does make sleeping difficulty, trying to find a comfortable position.  She is looking for some more relief at this point.  She denies any redness or swelling.  Gets pain in the lateral hip with walking or standing for prolonged periods of time.   Pertinent ROS were reviewed with the patient and found to be negative unless otherwise specified above in HPI.   Assessment & Plan: Visit Diagnoses:  1. Greater trochanteric bursitis of left hip   2. Pain in left hip   3. Acute pain of left shoulder    Plan: Unfortunately and is still having quite a bit of pain in the left shoulder and the hip.  She has been showing some strict strengthening improvements with PT, however her pain still persist.  We discussed all treatment options, through shared decision making elected to proceed with ultrasound-guided greater trochanteric injection today.  Patient tolerated well and had significant improvement in her pain following this.  She will stop the meloxicam due to the side effects of GI upset, return back to Tylenol as needed.  She has taken Aleve as well in the past without GI upset, she may take this for breakthrough pain.  She will continue her home therapy and PT.  She will follow-up in 2 weeks if this injection was helpful and we will consider subacromial injection  into the left shoulder at that time.  Follow-up: Return in about 2 weeks (around 06/09/2022) for with Dr. Rolena Infante.   Meds & Orders: No orders of the defined types were placed in this encounter.   Orders Placed This Encounter  Procedures   Large Joint Inj   US Guided Needle Placement - No Linked Charges     Procedures: Large Joint Inj: L greater trochanter on 05/26/2022 1:50 PM Details: 22 G 3.5 in needle, ultrasound-guided lateral approach Medications: 2 mL lidocaine 1 %; 2 mL bupivacaine 0.25 %; 40 mg methylPREDNISolone acetate 40 MG/ML Outcome: tolerated well, no immediate complications  US-Guided Greater Trochanteric Bursa Injection, left After discussion on risks/benefits/indications and informed verbal consent was obtained, a timeout was performed. The patient was lying in lateral recumbent position on exam table. Using ultrasound guidance, the greater trochanter was identified. The area overlying the trochanteric bursa was then prepped with Betadine and alcohol swabs. Following sterile precautions, ultrasound was reapplied to visualize needle guidance with a 22-gauge 3.5" needle utilizing an in-plane approach to inject the bursa with 2:2:1 lidocaine:bupivicaine:betamethasone. Delivery of the injectate was visualized into the region of hypoechoic fluid of the greater trochanteric bursa. Patient tolerated procedure well without immediate complications.    Procedure, treatment alternatives, risks and benefits explained, specific risks discussed. Consent was given by the patient. Patient was prepped and draped in the usual sterile fashion.  Clinical History: No specialty comments available.  She reports that she has never smoked. She has never been exposed to tobacco smoke. She has never used smokeless tobacco. No results for input(s): "HGBA1C", "LABURIC" in the last 8760 hours.  Objective:   Vital Signs: LMP 04/18/2010   Physical Exam  Gen: Well-appearing, in no acute  distress; non-toxic CV: Regular Rate. Well-perfused. Warm.  Resp: Breathing unlabored on room air; no wheezing. Psych: Fluid speech in conversation; appropriate affect; normal thought process Neuro: Sensation intact throughout. No gross coordination deficits.   Ortho Exam -Left hip: Examination of the left hip demonstrates no swelling or erythema.  There is quite exquisite tenderness to palpation over the greater trochanteric area.  There is some weakness and weakness secondary to pain with resisted hip abduction.  Fluid motion about the hip with passive and active internal/external logroll.  Neurovascular intact distally.  -Left shoulder: There is some generalized tenderness to palpation around the shoulder though no specific bony tenderness.  She can in all planes of motion with active range of motion, I am able to take her much further passively both with flexion, abduction and internal/external rotation.  Positive drop arm test.  Positive empty can test.  Imaging: *Hip x-ray from 04/22/2022: 2 views of the left hip were obtained including bilateral AP and frog-leg  lateral, ordered and reviewed by myself.  X-rays demonstrate no acute  fracture noted.  There is bony spurring of the overhanging rim of the  acetabulum.  Relatively well-preserved joint space of the left hip.  Past Medical/Family/Surgical/Social History: Medications & Allergies reviewed per EMR, new medications updated. Patient Active Problem List   Diagnosis Date Noted   Chronic diarrhea 12/14/2018   Paresthesias 06/25/2016   Prolonged Q-T interval on ECG 06/25/2016   Hypertension 10/24/2013   Hip pain 06/21/2013   Snoring 04/07/2012   GERD (gastroesophageal reflux disease) 01/22/2012   Gout 03/12/2011   GOITER 10/21/2010   BREAST CYST, LEFT 07/23/2010   B12 DEFICIENCY 03/08/2010   Bipolar 1 disorder ([place]) 03/08/2010   HYPERTHYROIDISM 02/11/2010   ANEMIA 02/11/2010   Past Medical History:  Diagnosis Date   ANEMIA     Anxiety    Asthma    B12 DEFICIENCY    Bipolar 1 disorder ([place]) 1985   BREAST CYST, LEFT    Breast tumor    GERD (gastroesophageal reflux disease)    GOITER    H/O cardiovascular stress test    Lex MV 4/14:  No ischemia, EF 72%, normal wall motion   Hypertension    Hyperthyroidism    INTERSTITIAL LUNG DISEASE    Irritable bowel syndrome with diarrhea    Family History  Problem Relation Age of Onset   Glaucoma Mother    Diabetes Mother    Hypertension Mother    Cancer Father        ?type   Hypertension Father    Past Surgical History:  Procedure Laterality Date   ABDOMINAL HYSTERECTOMY  06/03/10   BUNIONECTOMY     bilateral   GALLBLADDER SURGERY  1985   stones removed   HYDRADENITIS EXCISION Bilateral 05/20/2017   Procedure: EXCISION BILATERAL HIDRADENITIS AXILLA;  Surgeon: Erroll Luna, MD;  Location: [place];  Service: General;  Laterality: Bilateral;   KNEE SURGERY  1987   bilateral   LUMBAR LAMINECTOMY/DECOMPRESSION MICRODISCECTOMY Right 01/13/2013   Procedure: MICRODISCECTOMY FORAMINOTOMY OF L4-5 ON THE RIGHT;  Surgeon: Magnus Sinning, MD;  Location: [facility];  Service: Orthopedics;  Laterality: Right;  MEDIASTINOSCOPY  2001   STERNOTOMY  2001   TONSILLECTOMY     TUBAL LIGATION  1985, 1996   Social History   Occupational History    Comment: Disability  Tobacco Use   Smoking status: Never    Passive exposure: Never   Smokeless tobacco: Never  Vaping Use   Vaping Use: Never used  Substance and Sexual Activity   Alcohol use: No   Drug use: No   Sexual activity: Yes    Birth control/protection: None

## 2022-05-26 NOTE — Progress Notes (Signed)
Still having a lot of pain in both the shoulder and hip. Had 6 therapy visits; does not think it helped at all  Tylenol for pain; helps minimal

## 2022-06-06 NOTE — Telephone Encounter (Signed)
Done

## 2022-06-09 ENCOUNTER — Ambulatory Visit: Payer: Medicare Other | Admitting: Sports Medicine

## 2022-06-11 ENCOUNTER — Ambulatory Visit (INDEPENDENT_AMBULATORY_CARE_PROVIDER_SITE_OTHER): Payer: Medicare Other | Admitting: Sports Medicine

## 2022-06-11 ENCOUNTER — Encounter: Payer: Self-pay | Admitting: Sports Medicine

## 2022-06-11 DIAGNOSIS — M25552 Pain in left hip: Secondary | ICD-10-CM | POA: Diagnosis not present

## 2022-06-11 MED ORDER — MELOXICAM 15 MG PO TABS: 15.0000 mg | ORAL_TABLET | Freq: Every day | ORAL | 1 refills | Status: DC

## 2022-06-11 NOTE — Progress Notes (Signed)
Left hip follow up; States the injection lasted only 5 days Takes advil for pain; not much relief  Finished formal PT; doing her HEP  States shoulder is still bothersome but more worried about hip today
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
## 2022-06-11 NOTE — Patient Instructions (Signed)
Ann,  Sorry your hip is still bothering you.  We can always consider a repeat injection later down the line, but I would like this to last for more than 5 days.  We will order an MRI of the hip to evaluate for a possible tear or any other abnormality that we cannot see on x-ray. He will follow-up with me 3 days after obtaining her MRI scan to discuss neck steps.  You may switch to meloxicam, take 1 tablet a day.  You may use Tylenol with this in addition.  Enjoy the barbeque this weekend,  Dr. Rolena Infante

## 2022-06-29 ENCOUNTER — Ambulatory Visit
Admission: RE | Admit: 2022-06-29 | Discharge: 2022-06-29 | Disposition: A | Discharge status: Home / Self Care | Payer: Medicare Other | Source: Ambulatory Visit | Attending: Sports Medicine | Admitting: Sports Medicine

## 2022-06-29 DIAGNOSIS — M25552 Pain in left hip: Secondary | ICD-10-CM

## 2022-07-01 ENCOUNTER — Ambulatory Visit: Payer: Self-pay

## 2022-07-01 ENCOUNTER — Encounter: Payer: Self-pay | Admitting: Sports Medicine

## 2022-07-01 ENCOUNTER — Ambulatory Visit (INDEPENDENT_AMBULATORY_CARE_PROVIDER_SITE_OTHER): Payer: Medicare Other | Admitting: Sports Medicine

## 2022-07-01 DIAGNOSIS — M25552 Pain in left hip: Secondary | ICD-10-CM | POA: Diagnosis not present

## 2022-07-01 DIAGNOSIS — M4306 Spondylolysis, lumbar region: Secondary | ICD-10-CM

## 2022-07-01 MED ORDER — BETAMETHASONE SOD PHOS & ACET 6 (3-3) MG/ML IJ SUSP
6.0000 mg | INTRAMUSCULAR | Status: AC | PRN
  Administered 2022-07-01: 6 mg via INTRA_ARTICULAR

## 2022-07-01 MED ORDER — BUPIVACAINE HCL 0.25 % IJ SOLN
2.0000 mL | INTRAMUSCULAR | Status: AC | PRN
  Administered 2022-07-01: 2 mL via INTRA_ARTICULAR

## 2022-07-01 MED ORDER — LIDOCAINE HCL 1 % IJ SOLN
2.0000 mL | INTRAMUSCULAR | Status: AC | PRN
  Administered 2022-07-01: 2 mL

## 2022-07-01 NOTE — Patient Instructions (Signed)
Ann, it was great to see you again today, I hope this hip feels better with this injection.  Today, we performed 1 more additional injection into the lateral hip.  Take it easy for the next 2 days, rest.  You may use heat and/or Tylenol if you are having pain in the hip. If the meloxicam medicine is not helping you, you can stop taking this at this time.  I want you to see my partner, Dr. Ileene Rubens in about 1.5-2 weeks to see if he has any other recommendations for the hip or if he thinks this is coming from the back.  If your hip pain is feeling better before this appointment after the injection, you can cancel this if you wish.  You will follow-up with me after seeing Dr. Laurance Flatten.  If you have any further questions, please give the clinic a call [phone].  Elba Barman, DO Primary Care Sports Medicine Physician  [place]
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
## 2022-07-17 ENCOUNTER — Telehealth: Payer: Self-pay | Admitting: Sports Medicine

## 2022-07-17 NOTE — Telephone Encounter (Signed)
Patient called in stating she is in pain and would like meds prescribed until she can see Dr. Laurance Flatten on 12/06 please advise

## 2022-07-18 ENCOUNTER — Other Ambulatory Visit: Payer: Self-pay | Admitting: Sports Medicine

## 2022-07-18 MED ORDER — TRAMADOL HCL 50 MG PO TABS: 50.0000 mg | ORAL_TABLET | Freq: Three times a day (TID) | ORAL | 0 refills | Status: DC | PRN

## 2022-07-18 NOTE — Telephone Encounter (Signed)
Rx called in 

## 2022-07-23 ENCOUNTER — Ambulatory Visit (INDEPENDENT_AMBULATORY_CARE_PROVIDER_SITE_OTHER): Payer: Medicare Other

## 2022-07-23 ENCOUNTER — Encounter: Payer: Self-pay | Admitting: Orthopedic Surgery

## 2022-07-23 ENCOUNTER — Ambulatory Visit (INDEPENDENT_AMBULATORY_CARE_PROVIDER_SITE_OTHER): Payer: Medicare Other | Admitting: Orthopedic Surgery

## 2022-07-23 VITALS — BP 123/86 | HR 83 | Ht 65.0 in | Wt 179.0 lb

## 2022-07-23 DIAGNOSIS — M4306 Spondylolysis, lumbar region: Secondary | ICD-10-CM

## 2022-07-23 DIAGNOSIS — M25552 Pain in left hip: Secondary | ICD-10-CM | POA: Diagnosis not present
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
## 2022-07-30 ENCOUNTER — Ambulatory Visit: Payer: Medicare Other

## 2022-07-31 ENCOUNTER — Ambulatory Visit: Payer: Self-pay

## 2022-07-31 ENCOUNTER — Encounter: Payer: Self-pay | Admitting: Sports Medicine

## 2022-07-31 ENCOUNTER — Ambulatory Visit (INDEPENDENT_AMBULATORY_CARE_PROVIDER_SITE_OTHER): Payer: Medicare Other | Admitting: Sports Medicine

## 2022-07-31 DIAGNOSIS — M25552 Pain in left hip: Secondary | ICD-10-CM

## 2022-07-31 MED ORDER — METHYLPREDNISOLONE ACETATE 40 MG/ML IJ SUSP
80.0000 mg | INTRAMUSCULAR | Status: AC | PRN
  Administered 2022-07-31: 80 mg via INTRA_ARTICULAR

## 2022-07-31 MED ORDER — LIDOCAINE HCL 1 % IJ SOLN
4.0000 mL | INTRAMUSCULAR | Status: AC | PRN
  Administered 2022-07-31: 4 mL

## 2022-07-31 NOTE — Patient Instructions (Addendum)
Ann,  -Based on Dr. Tawanna Sat evaluation, he thinks this may be coming from the hip joint.  We did perform an ultrasound-guided injection into the hip today.  You are to take it easy for the next 2 days.  May use heat and/or Tylenol for any postinjection pain.  -If you do not get at least 50% improvement in pain over the next 2 weeks, there is 1 additional location over the back inside of the hip but I would like to evaluate.  I did look at this with ultrasound and we may consider just 1 additional injection if today's injection does not work.  -He will follow-up with me in about 3-4 weeks for reevaluation.  If you have any questions, please call our office.  Merry Christmas and Happy Holidays!  - Dr. Rolena Infante

## 2022-07-31 NOTE — Progress Notes (Signed)
   Procedure Note  Patient: Lynn Gonzalez             Date of Birth: 01-19-1961           MRN: [id number]             Visit Date: 07/31/2022  Procedures: Visit Diagnoses: No diagnosis found.  Large Joint Inj: L hip joint on 07/31/2022 10:25 AM Indications: pain Details: 22 G 3.5 in needle, ultrasound-guided anterior approach Medications: 4 mL lidocaine 1 %; 80 mg methylPREDNISolone acetate 40 MG/ML Outcome: tolerated well, no immediate complications  Procedure: US-guided intra-articular hip injection, left After discussion on risks/benefits/indications and informed verbal consent was obtained, a timeout was performed. Patient was lying supine on exam table. The hip was cleaned with betadine and alcohol swabs. Then utilizing ultrasound guidance, the patient's femoral head and neck junction was identified and subsequently injected with 4:2 lidocaine:depomedrol via an in-plane approach with ultrasound visualization of the injectate administered into the hip joint. Patient tolerated procedure well without immediate complications.  Procedure, treatment alternatives, risks and benefits explained, specific risks discussed. Consent was given by the patient. Immediately prior to procedure a time out was called to verify the correct patient, procedure, equipment, support staff and site/side marked as required. Patient was prepped and draped in the usual sterile fashion.     - I evaluated the patient about 10 minutes post-injection and she did have some improvement in pain and range of motion - I did discuss with [name] that if this does not give her at least 50% relief after 2 weeks, I would like to see her back in about 3-4 weeks for re-evaluation. May need to consider Ischiofemoral impingement and consider [place] and/or hydrodissection of sciatic nerve - follow-up with Dr. Laurance Flatten as indicated if these modalities do not work - he wanted to consider lumbar MRI  Elba Barman, DO [place]  This note was dictated using Dragon naturally speaking software and may contain errors in syntax, spelling, or content which have not been identified prior to signing this note.

## 2022-08-19 ENCOUNTER — Telehealth: Payer: Self-pay | Admitting: Sports Medicine

## 2022-08-19 NOTE — Telephone Encounter (Signed)
Patient states she has been out of town and was hurting, she has an appt on Thursday and states she will continue to take her Tylenol until Dr. Rolena Infante can see her..please advise..[phone]

## 2022-08-21 ENCOUNTER — Ambulatory Visit: Payer: Self-pay

## 2022-08-21 ENCOUNTER — Encounter: Payer: Self-pay | Admitting: Sports Medicine

## 2022-08-21 ENCOUNTER — Ambulatory Visit (INDEPENDENT_AMBULATORY_CARE_PROVIDER_SITE_OTHER): Payer: Medicare Other | Admitting: Sports Medicine

## 2022-08-21 DIAGNOSIS — M5432 Sciatica, left side: Secondary | ICD-10-CM | POA: Diagnosis not present

## 2022-08-21 DIAGNOSIS — M25552 Pain in left hip: Secondary | ICD-10-CM | POA: Diagnosis not present

## 2022-08-21 MED ORDER — GABAPENTIN 300 MG PO CAPS: 300.0000 mg | ORAL_CAPSULE | Freq: Two times a day (BID) | ORAL | 1 refills | Status: DC

## 2022-08-21 MED ORDER — METHYLPREDNISOLONE ACETATE 40 MG/ML IJ SUSP
80.0000 mg | INTRAMUSCULAR | Status: AC | PRN
  Administered 2022-08-21: 80 mg via INTRA_ARTICULAR

## 2022-08-21 MED ORDER — LIDOCAINE HCL 1 % IJ SOLN
4.0000 mL | INTRAMUSCULAR | Status: AC | PRN
  Administered 2022-08-21: 4 mL

## 2022-08-21 MED ORDER — GABAPENTIN 600 MG PO TABS: 300.0000 mg | ORAL_TABLET | Freq: Two times a day (BID) | ORAL | 0 refills | Status: DC

## 2022-08-21 MED ORDER — BUPIVACAINE HCL 0.25 % IJ SOLN
4.0000 mL | INTRAMUSCULAR | Status: AC | PRN
  Administered 2022-08-21: 4 mL via INTRA_ARTICULAR

## 2022-08-21 NOTE — Progress Notes (Signed)
Dagny Fiorentino Olveda - 62 y.o. female MRN [id number]  Date of birth: [birth year]  Office Visit Note: Visit Date: 08/21/2022 PCP: Pcp, No Referred by: No ref. provider found  Subjective: Chief Complaint  Patient presents with   Left Hip - Pain, Follow-up   HPI: Roniqua Kintz Kimmer is a pleasant 62 y.o. female who presents today for left posterior-lateral hip pain.  "[name]" is following up for her posterior lateral left hip pain.  She continues with quite severe pain that is debilitating for her.  Pain is localized over the posterior lateral buttock sometimes will radiate into the proximal thigh but denies any radiating pain going down past the knee.  This is worse with any activity that she does or sitting for prolonged periods of time.  She has undergone both a greater trochanteric injection which gave her excellent relief of her pain for about 1 week, and a subsequent intra-articular hip injection which only gave her pain relief for a few days.  She presents today to discuss other options.  Taken meloxicam in the past, but this was not as helpful.  Pertinent ROS were reviewed with the patient and found to be negative unless otherwise specified above in HPI.   Assessment & Plan: Visit Diagnoses:  1. Pain in left hip   2. Sciatica, left side    Plan: Discussed with him the possible etiology of her posterior lateral hip pain.  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  We discussed all treatment options such as PT/rehab, injection therapy, medication management.  Through shared decision making, elected to proceed with ultrasound-guided sciatic nerve hydrodissection and ischiofemoral injection.  She did have good relief with the anesthetic portion prior to leaving. I would like to see how this does over the next 1-2 weeks, if her pain is at least 50% better I would like her to resume her home hip  strengthening exercises for the posterior hip and lateral abductors.  She will update me in a few weeks how she is doing.  We will also get her started on gabapentin 300-600 mg twice a day.  I have less suspicion of this coming from the back, but if this injection does not help this calm down, I may have her see Dr. Laurance Flatten to evaluate for any radicular pain coming from her back versus seeing a hip specialist for her ischiofemoral impingement.  Follow-up: Will let me know over next couple weeks how this is doing   Meds & Orders:  Meds ordered this encounter  Medications   DISCONTD: gabapentin (NEURONTIN) 300 MG capsule    Sig: Take 1 capsule (300 mg total) by mouth 2 (two) times daily.    Dispense:  60 capsule    Refill:  1   gabapentin (NEURONTIN) 600 MG tablet    Sig: Take 0.5-1 tablets (300-600 mg total) by mouth 2 (two) times daily.    Dispense:  120 tablet    Refill:  0    Orders Placed This Encounter  Procedures   Large Joint Inj: L hip joint   [place] Extrem Low Left Ltd     Procedures: Large Joint Inj: L hip joint on 08/21/2022 2:52 PM Indications: pain, joint swelling and diagnostic evaluation Details: 22 G 3.5 in needle, ultrasound-guided posterior approach Medications: 4 mL lidocaine 1 %; 4 mL bupivacaine 0.25 %; 80 mg methylPREDNISolone acetate 40 MG/ML Outcome: tolerated well, no immediate complications  Procedure: US-guided ischiofemoral hip injection and sciatic nerve hydrodissection, left After discussion on risks/benefits/indications and informed verbal consent was obtained, a timeout was performed. Patient was lying prone on exam table. The posterior hip and buttock was cleaned with betadine and alcohol swabs. Then utilizing ultrasound guidance, the patient's posterior hip, ischium and greater trochanter was identified.  The sciatic nerve was identified here just inferior to the piriformis muscle belly. The area was anesthesized with 8cc of lidocaine 1% first. Then, using an  in-plane lateral to medial approach using ultrasound-guidance the sciatic nerve was hydrodissected 30cc D5W, 4cc lidocaine 1%, 4cc bupicivaine, 2 cc of methylprednisolone. Ultrasound demonstrated hydrodissection of the sciatic nerve. Approximately 8cc of the injectate was placed into the ischifemoral space as well. Patient tolerated procedure well without immediate complications.  Procedure, treatment alternatives, risks and benefits explained, specific risks discussed. Consent was given by the patient. Immediately prior to procedure a time out was called to verify the correct patient, procedure, equipment, support staff and site/side marked as required. Patient was prepped and draped in the usual sterile fashion.          Clinical History: No specialty comments available.  She reports that she has never smoked. She has never been exposed to tobacco smoke. She has never used smokeless tobacco. No results for input(s): "HGBA1C", "LABURIC" in the last 8760 hours.  Objective:   Vital Signs: LMP 04/18/2010   Physical Exam  Gen: Well-appearing, in no acute distress; non-toxic CV: Regular Rate. Well-perfused. Warm.  Resp: Breathing unlabored on room air; no wheezing. Psych: Fluid speech in conversation; appropriate affect; normal thought process Neuro: Sensation intact throughout. No gross coordination deficits.   Ortho Exam - Left hip/buttock: Examination of the left hip demonstrates no overlying erythema, or swelling.  There is rather exquisite TTP over the posterior lateral aspect of the greater trochanteric and into the posterior buttock overlying the sciatic nerve.  Very mild TTP over the ischial tuberosity.  There is weakness and pain with resisted hip abduction and hip extension.  Some mild pain with provocative ischiofemoral impingement.  No mechanical block to passive internal or external logroll.  Imaging: [place] Extrem Low Left Ltd  Result Date: 08/21/2022 Technically successful  ultrasound-guided sciatic nerve hydrodissection and ischiofemoral injection.   Narrative & Impression  CLINICAL DATA:  Severe left hip pain   EXAM: MR OF THE LEFT HIP WITHOUT CONTRAST   TECHNIQUE: Multiplanar, multisequence MR imaging was performed. No intravenous contrast was administered.   COMPARISON:  X-ray 04/22/2022   FINDINGS: Bones: No acute fracture. No dislocation. Mild osteoarthritis of the contralateral right hip. No right hip joint effusion. No femoral head avascular necrosis. Bony pelvis intact without diastasis. SI joints and pubic symphysis within normal limits. Degenerative disc disease and facet arthropathy of the lower lumbar spine, not well assessed on the included imaging planes. No bone marrow edema. No marrow replacing bone lesion.   Articular cartilage and labrum   Articular cartilage: Mild diffuse chondral thinning of the left hip joint without focal cartilage defect. No subchondral marrow signal changes.   Labrum: Grossly intact although evaluation is limited in the absence of intra-articular fluid. No paralabral cyst.   Joint or bursal effusion   Joint effusion:  None.   Bursae: No abnormal bursal fluid collection.   Muscles and tendons   Muscles and tendons: The gluteal, hamstring, iliopsoas, rectus femoris, and adductor tendons appear intact without tear or significant tendinosis. Mild intramuscular edema within the bilateral quadratus femoris muscles at the  ischiofemoral spaces. Ischiofemoral intervals measure 14 mm bilaterally. Otherwise normal muscle bulk and signal intensity without edema, atrophy, or fatty infiltration.   Other findings   Miscellaneous: No soft tissue edema or fluid collection. No inguinal lymphadenopathy.   IMPRESSION: 1. Mild osteoarthritis of the bilateral hips. No acute osseous findings. 2. 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 3. Lower lumbar spondylosis, incompletely  assessed.     Electronically Signed   By: Davina Poke D.O.   On: 06/30/2022 16:16    Past Medical/Family/Surgical/Social History: Medications & Allergies reviewed per EMR, new medications updated. Patient Active Problem List   Diagnosis Date Noted   Chronic diarrhea 12/14/2018   Paresthesias 06/25/2016   Prolonged Q-T interval on ECG 06/25/2016   Hypertension 10/24/2013   Hip pain 06/21/2013   Snoring 04/07/2012   GERD (gastroesophageal reflux disease) 01/22/2012   Gout 03/12/2011   GOITER 10/21/2010   BREAST CYST, LEFT 07/23/2010   B12 DEFICIENCY 03/08/2010   Bipolar 1 disorder ([place]) 03/08/2010   HYPERTHYROIDISM 02/11/2010   ANEMIA 02/11/2010   Past Medical History:  Diagnosis Date   ANEMIA    Anxiety    Asthma    B12 DEFICIENCY    Bipolar 1 disorder ([place]) 1985   BREAST CYST, LEFT    Breast tumor    GERD (gastroesophageal reflux disease)    GOITER    H/O cardiovascular stress test    Lex MV 4/14:  No ischemia, EF 72%, normal wall motion   Hypertension    Hyperthyroidism    INTERSTITIAL LUNG DISEASE    Irritable bowel syndrome with diarrhea    Family History  Problem Relation Age of Onset   Glaucoma Mother    Diabetes Mother    Hypertension Mother    Cancer Father        ?type   Hypertension Father    Past Surgical History:  Procedure Laterality Date   ABDOMINAL HYSTERECTOMY  06/03/10   BUNIONECTOMY     bilateral   GALLBLADDER SURGERY  1985   stones removed   HYDRADENITIS EXCISION Bilateral 05/20/2017   Procedure: EXCISION BILATERAL HIDRADENITIS AXILLA;  Surgeon: Erroll Luna, MD;  Location: [facility];  Service: General;  Laterality: Bilateral;   KNEE SURGERY  1987   bilateral   LUMBAR LAMINECTOMY/DECOMPRESSION MICRODISCECTOMY Right 01/13/2013   Procedure: MICRODISCECTOMY FORAMINOTOMY OF L4-5 ON THE RIGHT;  Surgeon: Magnus Sinning, MD;  Location: [facility];  Service: Orthopedics;  Laterality: Right;   MEDIASTINOSCOPY  2001   STERNOTOMY  2001    TONSILLECTOMY     TUBAL LIGATION  1985, 1996   Social History   Occupational History    Comment: Disability  Tobacco Use   Smoking status: Never    Passive exposure: Never   Smokeless tobacco: Never  Vaping Use   Vaping Use: Never used  Substance and Sexual Activity   Alcohol use: No   Drug use: No   Sexual activity: Yes    Birth control/protection: None   I spent 38 minutes in the care of the patient today including face-to-face time, preparation to see the patient, as well as reviewing previous imaging including hip x-rays, lumbar spine x-ray, MRI of the left hip; counseling and educating the patient on resuming home exercise therapy for the lateral and posterior hip; discussion of general overview of other treatment options/possible surgery that could be possible if these conservative measures fail for the above diagnoses.   Elba Barman, DO Primary Care Sports Medicine Physician  [facility]  [place]  This note was dictated using Dragon naturally speaking software and may contain errors in syntax, spelling, or content which have not been identified prior to signing this note.

## 2022-08-21 NOTE — Patient Instructions (Addendum)
Lynn Gonzalez,  We are figuring out the cause of your hip pain. I believe this was irritation of the sciatic nerve and the ischiofemoral space ("impingement") on the buttock and back hip when you fell.  -Today, we performed a sciatic nerve hydrodissection injection around the nerve and near the ischiofemoral space.  This will calm down the muscles irritation as well as the nerve tension in the back of the hip.  This is the final injection I would consider for you.  -We will also get started on gabapentin 300 mg, take 1 tablet twice a day in the morning and evening.   -If after 1 week, pain is at least 50% better, I would like you to resume your home hip exercises for strengthening.  -Let me know how you are feeling in a few weeks   - Dr. Rolena Infante

## 2022-09-16 ENCOUNTER — Ambulatory Visit: Payer: 59 | Admitting: Sports Medicine

## 2022-09-19 ENCOUNTER — Ambulatory Visit
Admission: RE | Admit: 2022-09-19 | Discharge: 2022-09-19 | Disposition: A | Discharge status: Home / Self Care | Payer: 59 | Source: Ambulatory Visit | Attending: Internal Medicine | Admitting: Internal Medicine

## 2022-09-19 DIAGNOSIS — Z1231 Encounter for screening mammogram for malignant neoplasm of breast: Secondary | ICD-10-CM

## 2022-09-24 ENCOUNTER — Encounter: Payer: Self-pay | Admitting: Sports Medicine

## 2022-09-24 ENCOUNTER — Ambulatory Visit (INDEPENDENT_AMBULATORY_CARE_PROVIDER_SITE_OTHER): Payer: 59 | Admitting: Sports Medicine

## 2022-09-24 ENCOUNTER — Ambulatory Visit: Payer: Self-pay

## 2022-09-24 DIAGNOSIS — Z8739 Personal history of other diseases of the musculoskeletal system and connective tissue: Secondary | ICD-10-CM

## 2022-09-24 DIAGNOSIS — M5432 Sciatica, left side: Secondary | ICD-10-CM | POA: Diagnosis not present

## 2022-09-24 DIAGNOSIS — M25552 Pain in left hip: Secondary | ICD-10-CM | POA: Diagnosis not present

## 2022-09-24 DIAGNOSIS — M25561 Pain in right knee: Secondary | ICD-10-CM

## 2022-09-24 DIAGNOSIS — M25461 Effusion, right knee: Secondary | ICD-10-CM

## 2022-09-24 MED ORDER — METHYLPREDNISOLONE ACETATE 40 MG/ML IJ SUSP
80.0000 mg | INTRAMUSCULAR | Status: AC | PRN
  Administered 2022-09-24: 80 mg via INTRA_ARTICULAR

## 2022-09-24 MED ORDER — BUPIVACAINE HCL 0.25 % IJ SOLN
2.0000 mL | INTRAMUSCULAR | Status: AC | PRN
  Administered 2022-09-24: 2 mL via INTRA_ARTICULAR

## 2022-09-24 MED ORDER — LIDOCAINE HCL 1 % IJ SOLN
4.0000 mL | INTRAMUSCULAR | Status: AC | PRN
  Administered 2022-09-24: 4 mL

## 2022-09-24 MED ORDER — DICLOFENAC SODIUM 50 MG PO TBEC: 50.0000 mg | DELAYED_RELEASE_TABLET | Freq: Two times a day (BID) | ORAL | 0 refills | Status: DC

## 2022-09-24 NOTE — Patient Instructions (Addendum)
Lynn Gonzalez -   I'm glad the hip is feeling better after the last injection. Hopefully we do not need more.  *You will continue the hip strengthening exercises, but I would like you to decrease to only doing it every other day.  *Today he has swelling in the knee, we drained this fluid and put some medicine inside.  I am not sure why the knee swelled up, we are sending off the fluid to be evaluated in the lab to see if there is any signs of gout, pseudogout or infection.  We will give you a call when this returns in a few days.  *Take Voltaren tablet (diclofenac) at least once a day, up to twice a day for any pain or swelling in the knee.  We will do this only for the next 7 to 10 days, then stop.   Dr. Rolena Infante

## 2022-09-24 NOTE — Progress Notes (Signed)
Doing a lot better today; no pain She is not on any medication  States she was sore for 3-4 days after injection but after that, the pain has completely went away

## 2022-09-24 NOTE — Progress Notes (Signed)
Laniece Hornbaker Pennock - 62 y.o. female MRN [id number]  Date of birth: [birth year]  Office Visit Note: Visit Date: 09/24/2022 PCP: Pcp, No Referred by: No ref. provider found  Subjective: Chief Complaint  Patient presents with   Lower Back - Pain   HPI: Sheelah Ritacco Mormino is a pleasant 62 y.o. female who presents today for follow-up of left posterior lateral hip pain.  Also having acute right knee pain and swelling for the last few days.  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  She has some soreness for a few days but then states for the first time in many months she had complete relief of her pain.  She states she has no pain in the hip currently.  Right knee pain - she has had swelling and pain in the right knee for the last 3 days.  She has not taken anything for the pain in terms of medication.  She does have a history of gout in the foot from years ago per chart review - she does not recall this.  Pertinent ROS were reviewed with the patient and found to be negative unless otherwise specified above in HPI.   Assessment & Plan: Visit Diagnoses:  1. Pain in left hip   2. Acute pain of right knee   3. Effusion, right knee   4. Posterior pain of hip, left   5. Sciatica, left side    Plan: Discussed with [name] that I am very excited that her ischiofemoral and sciatic nerve hydrodissection injection significantly helped her pain.  I do think that this points to the posterior hip and the sciatic nerve being the origin of her pain and not her back.  I would like her to continue her home rehab exercises for the lateral hip, scale back to every other day so she can work on improving her hip abduction strengthening (which is weak on exam)  now that she has better analgesic control.  We can always consider repeating this at a later date if necessary.  She also has a new problem of right knee pain and swelling -I am unsure of the etiology of this as she has not been diagnosed with knee arthritis or other pathology of the knee in the past.  She does have a history of gout on her chart review many years ago, we did send off the fluid aspirate for synovial fluid analysis as well as culture.  I will call her back in a few days when this returns to go through this with her to discuss next steps.  Given her pain and mild concern this could be gout, she will take diclofenac 50 mg twice a day only for the next 2 weeks then may discontinue.  Follow-up: Will call with results of knee aspiration labs    Meds & Orders:  Meds ordered this encounter  Medications   diclofenac (VOLTAREN) 50 MG EC tablet    Sig: Take 1 tablet (50 mg total) by mouth 2 (two) times daily.    Dispense:  30 tablet    Refill:  0    Orders Placed This Encounter  Procedures   Anaerobic and Aerobic Culture   US Guided Needle Placement - No Linked Charges   Synovial Fluid Analysis, Complete  Procedures: Large Joint Inj: R knee on 09/24/2022 12:02 PM Indications: joint swelling, pain and diagnostic evaluation Details: 18 G 1.5 in needle, ultrasound-guided superolateral approach Medications: 4 mL lidocaine 1 %; 2 mL bupivacaine 0.25 %; 80 mg methylPREDNISolone acetate 40 MG/ML Aspirate: 37 mL clear and yellow; sent for lab analysis Outcome: tolerated well, no immediate complications  US-guided Knee Aspiration & Injection, Right: After discussion on risks/benefits/indications was provided, informed verbal consent was obtained and a timeout was performed, patient was lying supine on exam table. The knee was prepped with chloraprep alcohol swabs. Utilizing superolateral approach with ultrasound evaluation, approximately 4 mL of lidocaine 1% was used for local anesthesia.  Then using the ultrasound in a transverse plane, an 18g needle on 60cc syringe was inserted into the knee joint using an in-plane approach with approximately 37 mL of clear yellow-colored fluid aspirated from the knee. Utilizing the same portal, the knee joint was then injected with 2:2 bupivicaine:depomedrol.  Patient tolerated procedure well without immediate complications.  Procedure, treatment alternatives, risks and benefits explained, specific risks discussed. Consent was given by the patient. Immediately prior to procedure a time out was called to verify the correct patient, procedure, equipment, support staff and site/side marked as required. Patient was prepped and draped in the usual sterile fashion.          Clinical History: No specialty comments available.  She reports that she has never smoked. She has never been exposed to tobacco smoke. She has never used smokeless tobacco. No results for input(s): "HGBA1C", "LABURIC" in the last 8760 hours.  Objective:   Vital Signs: LMP 04/18/2010   Physical Exam  Gen: Well-appearing, in no acute distress; non-toxic CV: Well-perfused. Warm.  Resp: Breathing unlabored on room air; no wheezing. Psych: Fluid speech in conversation; appropriate affect; normal thought process Neuro: Sensation intact throughout. No gross coordination deficits.   Ortho Exam - Left hip: Examination of the left hip demonstrates no overlying skin changes, erythema or swelling.  There is very mild TTP over the greater trochanteric Within the superior buttock, although markedly improved from previous visits.  There is still weakness with resisted hip abduction on the left side, compared to normal on the right side.  No mechanical blocks to passive internal or external logroll.  - Right knee: Evaluation of the knee does show a moderate effusion with some mild warmth.  No redness.  He has some generalized pain over the anterior aspect of the knee although no significant  joint line pain.  No ligamental laxity.  Imaging:    Past Medical/Family/Surgical/Social History: Medications & Allergies reviewed per EMR, new medications updated. Patient Active Problem List   Diagnosis Date Noted   Chronic diarrhea 12/14/2018   Paresthesias 06/25/2016   Prolonged Q-T interval on ECG 06/25/2016   Hypertension 10/24/2013   Hip pain 06/21/2013   Snoring 04/07/2012   GERD (gastroesophageal reflux disease) 01/22/2012   Gout 03/12/2011   GOITER 10/21/2010   BREAST CYST, LEFT 07/23/2010   B12 DEFICIENCY 03/08/2010   Bipolar 1 disorder ([place]) 03/08/2010   HYPERTHYROIDISM 02/11/2010   ANEMIA 02/11/2010   Past Medical History:  Diagnosis Date   ANEMIA    Anxiety    Asthma    B12 DEFICIENCY    Bipolar 1 disorder ([place]) 1985   BREAST CYST, LEFT    Breast tumor    GERD (gastroesophageal reflux disease)    GOITER    H/O cardiovascular stress test    Lex MV 4/14:  No ischemia,  EF 72%, normal wall motion   Hypertension    Hyperthyroidism    INTERSTITIAL LUNG DISEASE    Irritable bowel syndrome with diarrhea    Family History  Problem Relation Age of Onset   Glaucoma Mother    Diabetes Mother    Hypertension Mother    Cancer Father        ?type   Hypertension Father    Past Surgical History:  Procedure Laterality Date   ABDOMINAL HYSTERECTOMY  06/03/2010   BREAST BIOPSY Left    BUNIONECTOMY     bilateral   GALLBLADDER SURGERY  08/19/1983   stones removed   HYDRADENITIS EXCISION Bilateral 05/20/2017   Procedure: EXCISION BILATERAL HIDRADENITIS AXILLA;  Surgeon: Erroll Luna, MD;  Location: [facility];  Service: General;  Laterality: Bilateral;   KNEE SURGERY  08/18/1985   bilateral   LUMBAR LAMINECTOMY/DECOMPRESSION MICRODISCECTOMY Right 01/13/2013   Procedure: MICRODISCECTOMY FORAMINOTOMY OF L4-5 ON THE RIGHT;  Surgeon: Magnus Sinning, MD;  Location: [facility];  Service: Orthopedics;  Laterality: Right;   MEDIASTINOSCOPY  08/19/1999   STERNOTOMY   08/19/1999   TONSILLECTOMY     TUBAL LIGATION  1985, 1996   Social History   Occupational History    Comment: Disability  Tobacco Use   Smoking status: Never    Passive exposure: Never   Smokeless tobacco: Never  Vaping Use   Vaping Use: Never used  Substance and Sexual Activity   Alcohol use: No   Drug use: No   Sexual activity: Yes    Birth control/protection: None

## 2022-09-30 LAB — SYNOVIAL FLUID ANALYSIS, COMPLETE
Basophils, %: 0 %
Eosinophils-Synovial: 0 % (ref 0–2)
Lymphocytes-Synovial Fld: 17 % (ref 0–74)
Monocyte/Macrophage: 69 % (ref 0–69)
Neutrophil, Synovial: 14 % (ref 0–24)
Synoviocytes, %: 0 % (ref 0–15)
WBC, Synovial: 253 cells/uL — ABNORMAL HIGH (ref <150)

## 2022-09-30 LAB — ANAEROBIC AND AEROBIC CULTURE
AER RESULT:: NO GROWTH
MICRO NUMBER:: 14532577
MICRO NUMBER:: 14532578
SPECIMEN QUALITY:: ADEQUATE
SPECIMEN QUALITY:: ADEQUATE

## 2022-09-30 NOTE — Progress Notes (Signed)
Synovial fluid shows no signs of infection or inflammatory arthritis.  Fluid analysis shows normal synovial fluid, likely from underlying condition (arthritis, etc.). No change to treatment plan. Can we please call patient to notify. Thank you.  DB

## 2022-10-01 NOTE — Progress Notes (Signed)
Spoke with patient  Informed her of this message

## 2022-10-28 ENCOUNTER — Ambulatory Visit
Admission: EM | Admit: 2022-10-28 | Discharge: 2022-10-28 | Disposition: A | Discharge status: Home / Self Care | Payer: 59 | Attending: Family Medicine | Admitting: Family Medicine

## 2022-10-28 DIAGNOSIS — Z1152 Encounter for screening for COVID-19: Secondary | ICD-10-CM | POA: Insufficient documentation

## 2022-10-28 DIAGNOSIS — J069 Acute upper respiratory infection, unspecified: Secondary | ICD-10-CM | POA: Diagnosis not present

## 2022-10-28 MED ORDER — BENZONATATE 100 MG PO CAPS: 100.0000 mg | ORAL_CAPSULE | Freq: Three times a day (TID) | ORAL | 0 refills | Status: DC | PRN

## 2022-10-28 MED ORDER — IBUPROFEN 600 MG PO TABS: 600.0000 mg | ORAL_TABLET | Freq: Three times a day (TID) | ORAL | 0 refills | Status: DC | PRN

## 2022-10-28 NOTE — ED Triage Notes (Signed)
Pt presents with productive cough and congestion that is causing chest discomfort X 2 days.

## 2022-10-28 NOTE — Discharge Instructions (Addendum)
Take benzonatate 100 mg, 1 tab every 8 hours as needed for cough.  Tylenol as needed for the chest pain.  There are interactions between the pain medication I was going to send and your lithium, so I did not send any prescriptions for pain.   You have been swabbed for COVID, and the test will result in the next 24 hours. Our staff will call you if positive. If the COVID test is positive, you should quarantine until you are fever free for 24 hours and you are starting to feel better, and then take added precautions for the next 5 days, such as physical distancing/wearing a mask and good hand hygiene/washing.

## 2022-10-28 NOTE — ED Provider Notes (Signed)
[facility]    CSN: [id number] Arrival date & time: 10/28/22  1513      History   Chief Complaint Chief Complaint  Patient presents with   Cough    HPI Lynn Gonzalez is a 62 y.o. female.    Cough  Here for cough and congestion and chills.  Symptoms began on March 10, and then she got more congested and started coughing yesterday.  No fever at home.  No nausea or vomiting or diarrhea. She does have some pleuritic chest pain in the upper chest anteriorly.  Past medical history is significant for hypertension   Past Medical History:  Diagnosis Date   ANEMIA    Anxiety    Asthma    B12 DEFICIENCY    Bipolar 1 disorder ([place]) 1985   BREAST CYST, LEFT    Breast tumor    GERD (gastroesophageal reflux disease)    GOITER    H/O cardiovascular stress test    Lex MV 4/14:  No ischemia, EF 72%, normal wall motion   Hypertension    Hyperthyroidism    INTERSTITIAL LUNG DISEASE    Irritable bowel syndrome with diarrhea     Patient Active Problem List   Diagnosis Date Noted   Chronic diarrhea 12/14/2018   Paresthesias 06/25/2016   Prolonged Q-T interval on ECG 06/25/2016   Hypertension 10/24/2013   Hip pain 06/21/2013   Snoring 04/07/2012   GERD (gastroesophageal reflux disease) 01/22/2012   Gout 03/12/2011   GOITER 10/21/2010   BREAST CYST, LEFT 07/23/2010   B12 DEFICIENCY 03/08/2010   Bipolar 1 disorder ([place]) 03/08/2010   HYPERTHYROIDISM 02/11/2010   ANEMIA 02/11/2010    Past Surgical History:  Procedure Laterality Date   ABDOMINAL HYSTERECTOMY  06/03/2010   BREAST BIOPSY Left    BUNIONECTOMY     bilateral   GALLBLADDER SURGERY  08/19/1983   stones removed   HYDRADENITIS EXCISION Bilateral 05/20/2017   Procedure: EXCISION BILATERAL HIDRADENITIS AXILLA;  Surgeon: Erroll Luna, MD;  Location: [facility];  Service: General;  Laterality: Bilateral;   KNEE SURGERY  08/18/1985   bilateral   LUMBAR LAMINECTOMY/DECOMPRESSION MICRODISCECTOMY Right  01/13/2013   Procedure: MICRODISCECTOMY FORAMINOTOMY OF L4-5 ON THE RIGHT;  Surgeon: Magnus Sinning, MD;  Location: [facility];  Service: Orthopedics;  Laterality: Right;   MEDIASTINOSCOPY  08/19/1999   STERNOTOMY  08/19/1999   TONSILLECTOMY     TUBAL LIGATION  1985, 1996    OB History   No obstetric history on file.      Home Medications    Prior to Admission medications   Medication Sig Start Date End Date Taking? Authorizing Provider  albuterol (VENTOLIN HFA) 108 (90 Base) MCG/ACT inhaler Inhale 1-2 puffs into the lungs every 6 (six) hours as needed for shortness of breath. 10/01/19   [provider]  amLODipine (NORVASC) 5 MG tablet take 1 tablet by mouth once daily Patient taking differently: Take 5 mg by mouth at bedtime. 08/23/14   Debbrah Alar, NP  benzonatate (TESSALON) 100 MG capsule Take 1 capsule (100 mg total) by mouth 3 (three) times daily as needed for cough. 10/28/22   Barrett Henle, MD  Butenafine HCl 1 % cream Apply twice a day to affected area 12/12/20   Pearson Forster, NP  cetirizine (ZYRTEC) 10 MG tablet Take 1 tablet (10 mg total) by mouth daily. Patient not taking: Reported on 12/12/2020 05/15/20   Loura Halt A, NP  Cholecalciferol (VITAMIN D-3) 5000 units TABS Take  1 tablet by mouth daily.    [provider]  cyclobenzaprine (FLEXERIL) 10 MG tablet Take 0.5-1 tablets (5-10 mg total) by mouth at bedtime. 04/22/22   Elba Barman, DO  estradiol (ESTRACE) 2 MG tablet take 1 tablet by mouth once daily Patient taking differently: Take 2 mg by mouth daily. 08/23/14   Debbrah Alar, NP  gabapentin (NEURONTIN) 600 MG tablet Take 0.5-1 tablets (300-600 mg total) by mouth 2 (two) times daily. 08/21/22 10/20/22  Elba Barman, DO  lithium 300 MG tablet Take 300-600 mg by mouth See admin instructions. Take '300mg'$  in the morning and '600mg'$  at bedtime.    [provider]  mupirocin ointment (BACTROBAN) 2 % Apply 1 application topically daily. 09/18/21    Raspet, Junie Panning K, PA-C  potassium chloride SA (KLOR-CON) 20 MEQ tablet Take 20 mEq by mouth daily. Patient not taking: Reported on 12/12/2020 01/27/20   [provider]  QUEtiapine (SEROQUEL) 300 MG tablet Take 300 mg by mouth at bedtime.    [provider]    Family History Family History  Problem Relation Age of Onset   Glaucoma Mother    Diabetes Mother    Hypertension Mother    Cancer Father        ?type   Hypertension Father     Social History Social History   Tobacco Use   Smoking status: Never    Passive exposure: Never   Smokeless tobacco: Never  Vaping Use   Vaping Use: Never used  Substance Use Topics   Alcohol use: No   Drug use: No     Allergies   Adhesive [tape] and Latex   Review of Systems Review of Systems  Respiratory:  Positive for cough.      Physical Exam Triage Vital Signs ED Triage Vitals  Enc Vitals Group     BP 10/28/22 1555 (!) 145/90     Pulse Rate 10/28/22 1555 85     Resp 10/28/22 1555 17     Temp 10/28/22 1555 97.9 F (36.6 C)     Temp Source 10/28/22 1555 Oral     SpO2 10/28/22 1555 91 %     Weight --      Height --      Head Circumference --      Peak Flow --      Pain Score 10/28/22 1603 4     Pain Loc --      Pain Edu? --      Excl. in [place]? --    No data found.  Updated Vital Signs BP (!) 145/90 (BP Location: Left Arm)   Pulse 85   Temp 97.9 F (36.6 C) (Oral)   Resp 17   LMP 04/18/2010   SpO2 91%   Visual Acuity Right Eye Distance:   Left Eye Distance:   Bilateral Distance:    Right Eye Near:   Left Eye Near:    Bilateral Near:     Physical Exam   UC Treatments / Results  Labs (all labs ordered are listed, but only abnormal results are displayed) Labs Reviewed  SARS CORONAVIRUS 2 (TAT 6-24 HRS)    EKG   Radiology No results found.  Procedures Procedures (including critical care time)  Medications Ordered in UC Medications - No data to display  Initial Impression /  Assessment and Plan / UC Course  I have reviewed the triage vital signs and the nursing notes.  Pertinent labs & imaging results that were available during  my care of the patient were reviewed by me and considered in my medical decision making (see chart for details).        COVID swab was done, and if positive she is a candidate for Paxlovid unless there are interactions with her medications.  Her last EGFR was greater than 60  There are interactions with anti-inflammatories or tramadol and her lithium.  I have asked her to take Tylenol instead for the pain. Final Clinical Impressions(s) / UC Diagnoses   Final diagnoses:  Viral URI with cough     Discharge Instructions      Take benzonatate 100 mg, 1 tab every 8 hours as needed for cough.  Tylenol as needed for the chest pain.  There are interactions between the pain medication I was going to send and your lithium, so I did not send any prescriptions for pain.   You have been swabbed for COVID, and the test will result in the next 24 hours. Our staff will call you if positive. If the COVID test is positive, you should quarantine until you are fever free for 24 hours and you are starting to feel better, and then take added precautions for the next 5 days, such as physical distancing/wearing a mask and good hand hygiene/washing.      ED Prescriptions     Medication Sig Dispense Auth. Provider   benzonatate (TESSALON) 100 MG capsule  (Status: Discontinued) Take 1 capsule (100 mg total) by mouth 3 (three) times daily as needed for cough. 21 capsule Barrett Henle, MD   ibuprofen (ADVIL) 600 MG tablet  (Status: Discontinued) Take 1 tablet (600 mg total) by mouth every 8 (eight) hours as needed (pain). 15 tablet Robbin Escher, Gwenlyn Perking, MD   benzonatate (TESSALON) 100 MG capsule Take 1 capsule (100 mg total) by mouth 3 (three) times daily as needed for cough. 21 capsule Barrett Henle, MD      I have reviewed the PDMP during  this encounter.   Barrett Henle, MD 10/28/22 [phone]

## 2022-10-29 LAB — SARS CORONAVIRUS 2 (TAT 6-24 HRS): SARS Coronavirus 2: NEGATIVE

## 2023-02-01 ENCOUNTER — Telehealth: Payer: Self-pay

## 2023-02-01 ENCOUNTER — Ambulatory Visit
Admission: EM | Admit: 2023-02-01 | Discharge: 2023-02-01 | Disposition: A | Discharge status: Home / Self Care | Payer: 59 | Attending: Internal Medicine | Admitting: Internal Medicine

## 2023-02-01 DIAGNOSIS — W57XXXA Bitten or stung by nonvenomous insect and other nonvenomous arthropods, initial encounter: Secondary | ICD-10-CM

## 2023-02-01 DIAGNOSIS — L03115 Cellulitis of right lower limb: Secondary | ICD-10-CM

## 2023-02-01 DIAGNOSIS — S80861A Insect bite (nonvenomous), right lower leg, initial encounter: Secondary | ICD-10-CM | POA: Diagnosis not present

## 2023-02-01 MED ORDER — DOXYCYCLINE HYCLATE 100 MG PO CAPS: 100.0000 mg | ORAL_CAPSULE | Freq: Two times a day (BID) | ORAL | 0 refills | Status: DC

## 2023-02-01 NOTE — ED Provider Notes (Signed)
[facility]    CSN: [id number] Arrival date & time: 02/01/23  9562      History   Chief Complaint Chief Complaint  Patient presents with   Insect Bite    HPI Lynn Gonzalez is a 62 y.o. female.   Patient presents with rash to right medial ankle that she noticed about 4 days ago.  Patient is concerned for spider bite given that she noticed a spider in her bed recently.  Reports that her PCP saw it and told her that it could be a spider bite but he was not sure.  No medications were prescribed at that time.  She reports that she has had some chills and dizziness but denies body aches or fever.  She does not used any medications to help alleviate symptoms.  Denies any injury to the area.  States that it is itchy and painful.     Past Medical History:  Diagnosis Date   ANEMIA    Anxiety    Asthma    B12 DEFICIENCY    Bipolar 1 disorder ([facility]) 1985   BREAST CYST, LEFT    Breast tumor    GERD (gastroesophageal reflux disease)    GOITER    H/O cardiovascular stress test    Lex MV 4/14:  No ischemia, EF 72%, normal wall motion   Hypertension    Hyperthyroidism    INTERSTITIAL LUNG DISEASE    Irritable bowel syndrome with diarrhea     Patient Active Problem List   Diagnosis Date Noted   Chronic diarrhea 12/14/2018   Paresthesias 06/25/2016   Prolonged Q-T interval on ECG 06/25/2016   Hypertension 10/24/2013   Hip pain 06/21/2013   Snoring 04/07/2012   GERD (gastroesophageal reflux disease) 01/22/2012   Gout 03/12/2011   GOITER 10/21/2010   BREAST CYST, LEFT 07/23/2010   B12 DEFICIENCY 03/08/2010   Bipolar 1 disorder ([facility]) 03/08/2010   HYPERTHYROIDISM 02/11/2010   ANEMIA 02/11/2010    Past Surgical History:  Procedure Laterality Date   ABDOMINAL HYSTERECTOMY  06/03/2010   BREAST BIOPSY Left    BUNIONECTOMY     bilateral   GALLBLADDER SURGERY  08/19/1983   stones removed   HYDRADENITIS EXCISION Bilateral 05/20/2017   Procedure: EXCISION  BILATERAL HIDRADENITIS AXILLA;  Surgeon: Harriette Bouillon, MD;  Location: [facility];  Service: General;  Laterality: Bilateral;   KNEE SURGERY  08/18/1985   bilateral   LUMBAR LAMINECTOMY/DECOMPRESSION MICRODISCECTOMY Right 01/13/2013   Procedure: MICRODISCECTOMY FORAMINOTOMY OF L4-5 ON THE RIGHT;  Surgeon: Drucilla Schmidt, MD;  Location: [facility];  Service: Orthopedics;  Laterality: Right;   MEDIASTINOSCOPY  08/19/1999   STERNOTOMY  08/19/1999   TONSILLECTOMY     TUBAL LIGATION  1985, 1996    OB History   No obstetric history on file.      Home Medications    Prior to Admission medications   Medication Sig Start Date End Date Taking? Authorizing Provider  albuterol (VENTOLIN HFA) 108 (90 Base) MCG/ACT inhaler Inhale 1-2 puffs into the lungs every 6 (six) hours as needed for shortness of breath. 10/01/19   [provider]  amLODipine (NORVASC) 5 MG tablet take 1 tablet by mouth once daily Patient taking differently: Take 5 mg by mouth at bedtime. 08/23/14   Sandford Craze, NP  benzonatate (TESSALON) 100 MG capsule Take 1 capsule (100 mg total) by mouth 3 (three) times daily as needed for cough. 10/28/22   Zenia Resides, MD  Butenafine HCl 1 % cream  Apply twice a day to affected area 12/12/20   Ivette Loyal, NP  cetirizine (ZYRTEC) 10 MG tablet Take 1 tablet (10 mg total) by mouth daily. Patient not taking: Reported on 12/12/2020 05/15/20   Dahlia Byes A, NP  Cholecalciferol (VITAMIN D-3) 5000 units TABS Take 1 tablet by mouth daily.    [provider]  cyclobenzaprine (FLEXERIL) 10 MG tablet Take 0.5-1 tablets (5-10 mg total) by mouth at bedtime. 04/22/22   Madelyn Brunner, DO  doxycycline (VIBRAMYCIN) 100 MG capsule Take 1 capsule (100 mg total) by mouth 2 (two) times daily. 02/01/23   Gustavus Bryant, FNP  estradiol (ESTRACE) 2 MG tablet take 1 tablet by mouth once daily Patient taking differently: Take 2 mg by mouth daily. 08/23/14   Sandford Craze, NP  gabapentin  (NEURONTIN) 600 MG tablet Take 0.5-1 tablets (300-600 mg total) by mouth 2 (two) times daily. 08/21/22 10/20/22  Madelyn Brunner, DO  lithium 300 MG tablet Take 300-600 mg by mouth See admin instructions. Take 300mg  in the morning and 600mg  at bedtime.    [provider]  mupirocin ointment (BACTROBAN) 2 % Apply 1 application topically daily. 09/18/21   Raspet, Denny Peon K, PA-C  potassium chloride SA (KLOR-CON) 20 MEQ tablet Take 20 mEq by mouth daily. Patient not taking: Reported on 12/12/2020 01/27/20   [provider]  QUEtiapine (SEROQUEL) 300 MG tablet Take 300 mg by mouth at bedtime.    [provider]    Family History Family History  Problem Relation Age of Onset   Glaucoma Mother    Diabetes Mother    Hypertension Mother    Cancer Father        ?type   Hypertension Father     Social History Social History   Tobacco Use   Smoking status: Never    Passive exposure: Never   Smokeless tobacco: Never  Vaping Use   Vaping Use: Never used  Substance Use Topics   Alcohol use: No   Drug use: No     Allergies   Adhesive [tape], Latex, and Tapentadol   Review of Systems Review of Systems Per HPI  Physical Exam Triage Vital Signs ED Triage Vitals  Enc Vitals Group     BP 02/01/23 0822 118/83     Pulse Rate 02/01/23 0822 80     Resp 02/01/23 0822 18     Temp 02/01/23 0822 97.9 F (36.6 C)     Temp Source 02/01/23 0822 Oral     SpO2 02/01/23 0822 98 %     Weight --      Height --      Head Circumference --      Peak Flow --      Pain Score 02/01/23 0820 7     Pain Loc --      Pain Edu? --      Excl. in [place]? --    No data found.  Updated Vital Signs BP 118/83 (BP Location: Left Arm)   Pulse 80   Temp 97.9 F (36.6 C) (Oral)   Resp 18   LMP 04/18/2010   SpO2 98%   Visual Acuity Right Eye Distance:   Left Eye Distance:   Bilateral Distance:    Right Eye Near:   Left Eye Near:    Bilateral Near:     Physical Exam Constitutional:       General: She is not in acute distress.    Appearance: Normal appearance. She is not  toxic-appearing or diaphoretic.  HENT:     Head: Normocephalic and atraumatic.  Eyes:     Extraocular Movements: Extraocular movements intact.     Conjunctiva/sclera: Conjunctivae normal.  Pulmonary:     Effort: Pulmonary effort is normal.  Skin:    Comments: Patient has bruising discoloration with erythema surrounding present to right medial ankle.  Mild swelling noted.  It is tender to palpation as well.  No purulent drainage.  No area of induration or fluctuance.  Capillary refill and pulses are intact.  Patient has full range of motion of ankle.  Neurological:     General: No focal deficit present.     Mental Status: She is alert and oriented to person, place, and time. Mental status is at baseline.  Psychiatric:        Mood and Affect: Mood normal.        Behavior: Behavior normal.        Thought Content: Thought content normal.        Judgment: Judgment normal.      UC Treatments / Results  Labs (all labs ordered are listed, but only abnormal results are displayed) Labs Reviewed  CBC  BASIC METABOLIC PANEL    EKG   Radiology No results found.  Procedures Procedures (including critical care time)  Medications Ordered in UC Medications - No data to display  Initial Impression / Assessment and Plan / UC Course  I have reviewed the triage vital signs and the nursing notes.  Pertinent labs & imaging results that were available during my care of the patient were reviewed by me and considered in my medical decision making (see chart for details).     Physical exam is most consistent with spider bite.  Although, given symptoms are worsening and patient is having chills, will treat with doxycycline to cover for any cellulitis related to spider bite.  BMP and CBC pending as well given systemic symptoms and appearance of rash on exam.  Advised patient to monitor area closely and to  follow-up sooner if it worsens or if she develops fever.  Patient verbalized understanding and was agreeable with plan. Final Clinical Impressions(s) / UC Diagnoses   Final diagnoses:  Insect bite of right lower leg, initial encounter  Cellulitis of leg, right     Discharge Instructions      I have prescribed you an antibiotic to treat any infection associated with insect/spider bite.  You may also use cool compresses as we discussed to decrease inflammation and help alleviate discomfort.  Monitor for any worsening symptoms and follow-up if they occur.  Blood work is pending.  Will call if it is abnormal.    ED Prescriptions     Medication Sig Dispense Auth. Provider   doxycycline (VIBRAMYCIN) 100 MG capsule Take 1 capsule (100 mg total) by mouth 2 (two) times daily. 20 capsule Gustavus Bryant, [place]      PDMP not reviewed this encounter.   Gustavus Bryant, [place] 02/01/23 [phone]

## 2023-02-01 NOTE — Discharge Instructions (Addendum)
I have prescribed you an antibiotic to treat any infection associated with insect/spider bite.  You may also use cool compresses as we discussed to decrease inflammation and help alleviate discomfort.  Monitor for any worsening symptoms and follow-up if they occur.  Blood work is pending.  Will call if it is abnormal.

## 2023-02-01 NOTE — ED Triage Notes (Addendum)
Pt states her PCP noticed redness to her right lower leg Thursday and began having soreness today.   Home interventions: none

## 2023-02-02 LAB — BASIC METABOLIC PANEL
BUN/Creatinine Ratio: 9 — ABNORMAL LOW (ref 12–28)
BUN: 8 mg/dL (ref 8–27)
CO2: 20 mmol/L (ref 20–29)
Calcium: 10 mg/dL (ref 8.7–10.3)
Chloride: 103 mmol/L (ref 96–106)
Creatinine, Ser: 0.89 mg/dL (ref 0.57–1.00)
Glucose: 86 mg/dL (ref 70–99)
Potassium: 4.3 mmol/L (ref 3.5–5.2)
Sodium: 137 mmol/L (ref 134–144)
eGFR: 74 mL/min/{1.73_m2} (ref >59)

## 2023-02-02 LAB — CBC
Hematocrit: 38.6 % (ref 34.0–46.6)
Hemoglobin: 12.1 g/dL (ref 11.1–15.9)
MCH: 26.5 pg — ABNORMAL LOW (ref 26.6–33.0)
MCHC: 31.3 g/dL — ABNORMAL LOW (ref 31.5–35.7)
MCV: 85 fL (ref 79–97)
Platelets: 184 10*3/uL (ref 150–450)
RBC: 4.57 x10E6/uL (ref 3.77–5.28)
RDW: 12.1 % (ref 11.7–15.4)
WBC: 7.1 10*3/uL (ref 3.4–10.8)

## 2023-05-02 ENCOUNTER — Ambulatory Visit
Admission: EM | Admit: 2023-05-02 | Discharge: 2023-05-02 | Disposition: A | Discharge status: Home / Self Care | Payer: 59 | Attending: Physician Assistant | Admitting: Physician Assistant

## 2023-05-02 DIAGNOSIS — R079 Chest pain, unspecified: Secondary | ICD-10-CM

## 2023-05-02 DIAGNOSIS — R0602 Shortness of breath: Secondary | ICD-10-CM

## 2023-05-02 NOTE — ED Triage Notes (Signed)
Started with "sneezing, coughing and chest pain, then sweating a lot on Thursday". "I have been taking any kind of medicine (OTC) I can take, nothing is helping". No sob. No respiratory distress. No fever known.

## 2023-05-02 NOTE — Discharge Instructions (Signed)
  Please report to [facility] after leaving our office.

## 2023-05-02 NOTE — ED Notes (Signed)
Patient is being discharged from the Urgent Care and sent to the Emergency Department via Private Vehicle (Friend) . Per Provider Guy Sandifer), patient is in need of higher level of care due to Complexity of Care (Chest pain, EKG Changes). Patient is aware and verbalizes understanding of plan of care.  Vitals:   05/02/23 1011  BP: 124/82  Pulse: 81  Resp: 18  Temp: 98.4 F (36.9 C)  SpO2: 99%

## 2023-05-02 NOTE — ED Notes (Signed)
Patient states "my friend is taking a shower and then on her way to take me to ED/[place]". Patient asked to wait in "lobby" v/s Room while waiting on friend. Currently no sob, no respiratory symptoms of concern, no change in chest pain. Patient will notify staff of any changes or concerns while waiting.

## 2023-05-07 NOTE — ED Provider Notes (Signed)
[facility]    CSN: [id number] Arrival date & time: 05/02/23  1001      History   Chief Complaint Chief Complaint  Patient presents with   Chest Pain   Cough    HPI Lynn Gonzalez is a 62 y.o. female.   Patient here today for evaluation of sneezing, coughing and chest pain with cough that started Thursday.  She also reports she has been sweating.  She has been taken over-the-counter medication without resolution.  She denies any shortness of breath initially but then reports same.  She has not any fever that she is aware of.  The history is provided by the patient.  Chest Pain Associated symptoms: cough and shortness of breath   Associated symptoms: no abdominal pain, no fever, no nausea and no vomiting   Cough Associated symptoms: chest pain and shortness of breath   Associated symptoms: no chills, no ear pain, no eye discharge, no fever, no sore throat and no wheezing     Past Medical History:  Diagnosis Date   ANEMIA    Anxiety    Asthma    B12 DEFICIENCY    Bipolar 1 disorder (HCC) 1985   BREAST CYST, LEFT    Breast tumor    GERD (gastroesophageal reflux disease)    GOITER    H/O cardiovascular stress test    Lex MV 4/14:  No ischemia, EF 72%, normal wall motion   Hypertension    Hyperthyroidism    INTERSTITIAL LUNG DISEASE    Irritable bowel syndrome with diarrhea     Patient Active Problem List   Diagnosis Date Noted   Chronic diarrhea 12/14/2018   Paresthesias 06/25/2016   Prolonged Q-T interval on ECG 06/25/2016   Hypertension 10/24/2013   Hip pain 06/21/2013   Snoring 04/07/2012   GERD (gastroesophageal reflux disease) 01/22/2012   Gout 03/12/2011   GOITER 10/21/2010   BREAST CYST, LEFT 07/23/2010   B12 DEFICIENCY 03/08/2010   Bipolar 1 disorder (HCC) 03/08/2010   HYPERTHYROIDISM 02/11/2010   ANEMIA 02/11/2010    Past Surgical History:  Procedure Laterality Date   ABDOMINAL HYSTERECTOMY  06/03/2010   BREAST BIOPSY Left     BUNIONECTOMY     bilateral   GALLBLADDER SURGERY  08/19/1983   stones removed   HYDRADENITIS EXCISION Bilateral 05/20/2017   Procedure: EXCISION BILATERAL HIDRADENITIS AXILLA;  Surgeon: Harriette Bouillon, MD;  Location: [facility] OR;  Service: General;  Laterality: Bilateral;   KNEE SURGERY  08/18/1985   bilateral   LUMBAR LAMINECTOMY/DECOMPRESSION MICRODISCECTOMY Right 01/13/2013   Procedure: MICRODISCECTOMY FORAMINOTOMY OF L4-5 ON THE RIGHT;  Surgeon: Drucilla Schmidt, MD;  Location: [facility] ORS;  Service: Orthopedics;  Laterality: Right;   MEDIASTINOSCOPY  08/19/1999   STERNOTOMY  08/19/1999   TONSILLECTOMY     TUBAL LIGATION  1985, 1996    OB History   No obstetric history on file.      Home Medications    Prior to Admission medications   Medication Sig Start Date End Date Taking? Authorizing Provider  albuterol (VENTOLIN HFA) 108 (90 Base) MCG/ACT inhaler Inhale 1-2 puffs into the lungs every 6 (six) hours as needed for shortness of breath. 10/01/19  Yes [provider]  amLODipine (NORVASC) 5 MG tablet take 1 tablet by mouth once daily Patient taking differently: Take 5 mg by mouth at bedtime. 08/23/14  Yes Sandford Craze, NP  Cholecalciferol (VITAMIN D-3) 5000 units TABS Take 1 tablet by mouth daily.   Yes [provider]  lithium 300 MG tablet Take 300-600 mg by mouth See admin instructions. Take 300mg  in the morning and 600mg  at bedtime.   Yes [provider]  potassium chloride SA (KLOR-CON) 20 MEQ tablet Take 20 mEq by mouth daily. 01/27/20  Yes [provider]  QUEtiapine (SEROQUEL) 300 MG tablet Take 300 mg by mouth at bedtime.   Yes [provider]  benzonatate (TESSALON) 100 MG capsule Take 1 capsule (100 mg total) by mouth 3 (three) times daily as needed for cough. 10/28/22   Zenia Resides, MD  Butenafine HCl 1 % cream Apply twice a day to affected area 12/12/20   Ivette Loyal, NP  cetirizine (ZYRTEC) 10 MG tablet Take 1  tablet (10 mg total) by mouth daily. Patient not taking: Reported on 12/12/2020 05/15/20   Dahlia Byes A, NP  cyclobenzaprine (FLEXERIL) 10 MG tablet Take 0.5-1 tablets (5-10 mg total) by mouth at bedtime. 04/22/22   Madelyn Brunner, DO  doxycycline (VIBRAMYCIN) 100 MG capsule Take 1 capsule (100 mg total) by mouth 2 (two) times daily. 02/01/23   Gustavus Bryant, FNP  estradiol (ESTRACE) 2 MG tablet take 1 tablet by mouth once daily Patient taking differently: Take 2 mg by mouth daily. 08/23/14   Sandford Craze, NP  gabapentin (NEURONTIN) 600 MG tablet Take 0.5-1 tablets (300-600 mg total) by mouth 2 (two) times daily. 08/21/22 10/20/22  Madelyn Brunner, DO  mupirocin ointment (BACTROBAN) 2 % Apply 1 application topically daily. 09/18/21   Raspet, Noberto Retort, PA-C    Family History Family History  Problem Relation Age of Onset   Glaucoma Mother    Diabetes Mother    Hypertension Mother    Cancer Father        ?type   Hypertension Father     Social History Social History   Tobacco Use   Smoking status: Never    Passive exposure: Never   Smokeless tobacco: Never  Vaping Use   Vaping status: Never Used  Substance Use Topics   Alcohol use: No   Drug use: No     Allergies   Adhesive [tape], Latex, Tapentadol, and Wound dressing adhesive   Review of Systems Review of Systems  Constitutional:  Negative for chills and fever.  HENT:  Positive for congestion. Negative for ear pain and sore throat.   Eyes:  Negative for discharge and redness.  Respiratory:  Positive for cough and shortness of breath. Negative for wheezing.   Cardiovascular:  Positive for chest pain.  Gastrointestinal:  Negative for abdominal pain, diarrhea, nausea and vomiting.  Genitourinary:  Positive for vaginal bleeding and vaginal discharge.     Physical Exam Triage Vital Signs ED Triage Vitals  Encounter Vitals Group     BP 05/02/23 1011 124/82     Systolic BP Percentile --      Diastolic BP Percentile --       Pulse Rate 05/02/23 1011 81     Resp 05/02/23 1011 18     Temp 05/02/23 1011 98.4 F (36.9 C)     Temp Source 05/02/23 1011 Oral     SpO2 05/02/23 1011 99 %     Weight 05/02/23 1013 179 lb 0.2 oz (81.2 kg)     Height 05/02/23 1013 5\' 5"  (1.651 m)     Head Circumference --      Peak Flow --      Pain Score 05/02/23 1013 8     Pain Loc --  Pain Education --      Exclude from Growth Chart --    No data found.  Updated Vital Signs BP 124/82 (BP Location: Left Arm)   Pulse 81   Temp 98.4 F (36.9 C) (Oral)   Resp 18   Ht 5\' 5"  (1.651 m)   Wt 179 lb 0.2 oz (81.2 kg)   LMP 04/18/2010   SpO2 99%   BMI 29.79 kg/m      Physical Exam Vitals and nursing note reviewed.  Constitutional:      General: She is not in acute distress.    Appearance: Normal appearance. She is not ill-appearing.  HENT:     Head: Normocephalic and atraumatic.  Eyes:     Conjunctiva/sclera: Conjunctivae normal.  Cardiovascular:     Rate and Rhythm: Normal rate and regular rhythm.  Pulmonary:     Effort: Pulmonary effort is normal. No respiratory distress.     Breath sounds: Normal breath sounds. No wheezing, rhonchi or rales.  Neurological:     Mental Status: She is alert.  Psychiatric:        Mood and Affect: Mood normal.        Behavior: Behavior normal.        Thought Content: Thought content normal.      UC Treatments / Results  Labs (all labs ordered are listed, but only abnormal results are displayed) Labs Reviewed - No data to display  EKG   Radiology No results found.  Procedures Procedures (including critical care time)  Medications Ordered in UC Medications - No data to display  Initial Impression / Assessment and Plan / UC Course  I have reviewed the triage vital signs and the nursing notes.  Pertinent labs & imaging results that were available during my care of the patient were reviewed by me and considered in my medical decision making (see chart for details).     Given reported chest discomfort and shortness of breath recommended further evaluation in the emergency room.  Patient expressed understanding.  Final Clinical Impressions(s) / UC Diagnoses   Final diagnoses:  Chest pain, unspecified type  Shortness of breath     Discharge Instructions       Please report to [facility] ED after leaving our office.    ED Prescriptions   None    PDMP not reviewed this encounter.   Tomi Bamberger, PA-C 05/07/23 2118

## 2023-05-28 ENCOUNTER — Ambulatory Visit (INDEPENDENT_AMBULATORY_CARE_PROVIDER_SITE_OTHER): Payer: 59 | Admitting: Family

## 2023-05-28 ENCOUNTER — Encounter: Payer: Self-pay | Admitting: Family

## 2023-05-28 VITALS — BP 125/80 | HR 87 | Temp 98.3°F | Resp 16 | Ht 66.0 in | Wt 182.0 lb

## 2023-05-28 DIAGNOSIS — R112 Nausea with vomiting, unspecified: Secondary | ICD-10-CM | POA: Insufficient documentation

## 2023-05-28 DIAGNOSIS — K529 Noninfective gastroenteritis and colitis, unspecified: Secondary | ICD-10-CM

## 2023-05-28 DIAGNOSIS — Z1211 Encounter for screening for malignant neoplasm of colon: Secondary | ICD-10-CM

## 2023-05-28 DIAGNOSIS — R634 Abnormal weight loss: Secondary | ICD-10-CM | POA: Insufficient documentation

## 2023-05-28 DIAGNOSIS — R109 Unspecified abdominal pain: Secondary | ICD-10-CM | POA: Diagnosis not present

## 2023-05-28 DIAGNOSIS — G8929 Other chronic pain: Secondary | ICD-10-CM | POA: Diagnosis not present

## 2023-05-28 DIAGNOSIS — K580 Irritable bowel syndrome with diarrhea: Secondary | ICD-10-CM

## 2023-05-28 DIAGNOSIS — Z7689 Persons encountering health services in other specified circumstances: Secondary | ICD-10-CM | POA: Diagnosis not present

## 2023-05-28 DIAGNOSIS — R1084 Generalized abdominal pain: Secondary | ICD-10-CM | POA: Insufficient documentation

## 2023-05-28 NOTE — Progress Notes (Signed)
Subjective:    Lynn Gonzalez - 62 y.o. female MRN [id number]  Date of birth: 03-13-1961  HPI  Lynn Gonzalez is to establish care.   Current issues and/or concerns: - Chronic abdominal pain since 2017. States in the past she has been seen by several specialists and was told that "nothing is wrong". Reports she has a bowel movement after each meal. She denies red flag symptoms. She requests colonoscopy and referral to specialist for evaluation.  - No further issues/concerns for discussion today.    ROS per HPI     Health Maintenance:  Health Maintenance Due  Topic Date Due   HIV Screening  Never done   Hepatitis C Screening  Never done     Past Medical History: Patient Active Problem List   Diagnosis Date Noted   Abnormal weight loss 05/28/2023   Generalized abdominal pain 05/28/2023   Nausea and vomiting 05/28/2023   Chronic diarrhea 12/14/2018   Paresthesias 06/25/2016   Prolonged Q-T interval on ECG 06/25/2016   Hypertension 10/24/2013   Hip pain 06/21/2013   Snoring 04/07/2012   GERD (gastroesophageal reflux disease) 01/22/2012   Gout 03/12/2011   Goiter 10/21/2010   BREAST CYST, LEFT 07/23/2010   B12 DEFICIENCY 03/08/2010   Bipolar 1 disorder (HCC) 03/08/2010   Thyrotoxicosis 02/11/2010   ANEMIA 02/11/2010      Social History   reports that she has never smoked. She has never been exposed to tobacco smoke. She has never used smokeless tobacco. She reports that she does not drink alcohol and does not use drugs.   Family History  family history includes Cancer in her father; Diabetes in her mother; Glaucoma in her mother; Hypertension in her father and mother.   Medications: reviewed and updated   Objective:   Physical Exam BP 125/80   Pulse 87   Temp 98.3 F (36.8 C) (Oral)   Resp 16   Ht 5\' 6"  (1.676 m)   Wt 182 lb (82.6 kg)   LMP 04/18/2010   SpO2 96%   BMI 29.38 kg/m   Physical Exam HENT:     Head: Normocephalic and atraumatic.      Nose: Nose normal.     Mouth/Throat:     Mouth: Mucous membranes are moist.     Pharynx: Oropharynx is clear.  Eyes:     Extraocular Movements: Extraocular movements intact.     Conjunctiva/sclera: Conjunctivae normal.     Pupils: Pupils are equal, round, and reactive to light.  Cardiovascular:     Rate and Rhythm: Normal rate and regular rhythm.     Pulses: Normal pulses.     Heart sounds: Normal heart sounds.  Pulmonary:     Effort: Pulmonary effort is normal.     Breath sounds: Normal breath sounds.  Abdominal:     General: Bowel sounds are normal.     Palpations: Abdomen is soft.  Musculoskeletal:        General: Normal range of motion.     Cervical back: Normal range of motion and neck supple.  Neurological:     General: No focal deficit present.     Mental Status: She is alert and oriented to person, place, and time.  Psychiatric:        Mood and Affect: Mood normal.        Behavior: Behavior normal.       Assessment & Plan:  1. Encounter to establish care - Patient presents today to establish care. During the  interim follow-up with primary provider as scheduled.  - Return for annual physical examination, labs, and health maintenance. Arrive fasting meaning having no food for at least 8 hours prior to appointment. You may have only water or black coffee. Please take scheduled medications as normal.  2. Chronic diarrhea 3. Irritable bowel syndrome with diarrhea 4. Chronic abdominal pain - Referral to Gastroenterology for evaluation/management.  - Ambulatory referral to Gastroenterology  5. Colon cancer screening - Referral to Gastroenterology for colon cancer screening by colonoscopy. - Ambulatory referral to Gastroenterology   Patient was given clear instructions to go to Emergency Department or return to medical center if symptoms don't improve, worsen, or new problems develop.The patient verbalized understanding.  I discussed the assessment and treatment  plan with the patient. The patient was provided an opportunity to ask questions and all were answered. The patient agreed with the plan and demonstrated an understanding of the instructions.   The patient was advised to call back or seek an in-person evaluation if the symptoms worsen or if the condition fails to improve as anticipated.    Ricky Stabs, NP 05/28/2023, 11:43 AM Primary Care at [facility]

## 2023-06-15 ENCOUNTER — Telehealth: Payer: Self-pay | Admitting: Family Medicine

## 2023-06-15 NOTE — Telephone Encounter (Signed)
Please see patent request for GI in [facility]

## 2023-06-15 NOTE — Telephone Encounter (Signed)
Copied from CRM [id number]. Topic: Referral - Request for Referral >> Jun 12, 2023 11:06 AM Everette C wrote: Has patient seen PCP for this complaint? Yes.   *If NO, is insurance requiring patient see PCP for this issue before PCP can refer them? Referral for which specialty: Gastroenterology  Preferred provider/office: Patient has no preference  Reason for referral: Patient would like a colonoscopy in [facility]

## 2023-06-16 ENCOUNTER — Other Ambulatory Visit: Payer: Self-pay | Admitting: Family Medicine

## 2023-06-16 NOTE — Telephone Encounter (Signed)
Medication Refill - Medication: estradiol (ESTRACE) 2 MG tablet (patient is completely out)   Has the patient contacted their pharmacy? Yes.    (Agent: If yes, when and what did the pharmacy advise?) Contact PCP   Preferred Pharmacy (with phone number or street name):  [id number] [place], [place] - [address] AT [address] Phone: [phone]  Fax: [phone]      Has the patient been seen for an appointment in the last year OR does the patient have an upcoming appointment? Yes.    Agent: Please be advised that RX refills may take up to 3 business days. We ask that you follow-up with your pharmacy.

## 2023-06-17 NOTE — Telephone Encounter (Signed)
Requested medication (s) are due for refill today: For review  Requested medication (s) are on the active medication list: yes    Last refill: 08/23/14   #30   0 refills  Future visit scheduled  yes 06/30/23  Notes to clinic: Historical provider.  LRF was in 2016  Requested Prescriptions  Pending Prescriptions Disp Refills   estradiol (ESTRACE) 2 MG tablet 30 tablet 0    Sig: Take 1 tablet (2 mg total) by mouth daily.     OB/GYN:  Estrogens Passed - 06/16/2023  2:24 PM      Passed - Mammogram is up-to-date per Health Maintenance      Passed - Last BP in normal range    BP Readings from Last 1 Encounters:  05/28/23 125/80         Passed - Valid encounter within last 12 months    Recent Outpatient Visits           2 weeks ago Encounter to establish care   [facility] at [facility], [place], NP       Future Appointments             In 1 week Rema Fendt, NP [facility] at [facility]

## 2023-06-19 NOTE — Telephone Encounter (Signed)
Last prescribed 08/23/2014. Schedule appointment.

## 2023-06-26 ENCOUNTER — Telehealth: Payer: Self-pay | Admitting: Family Medicine

## 2023-06-26 NOTE — Telephone Encounter (Signed)
A user error has taken place: encounter opened in error, closed for administrative reasons.

## 2023-06-30 ENCOUNTER — Ambulatory Visit (INDEPENDENT_AMBULATORY_CARE_PROVIDER_SITE_OTHER): Payer: 59 | Admitting: Family

## 2023-06-30 ENCOUNTER — Encounter: Payer: Self-pay | Admitting: Family

## 2023-06-30 VITALS — BP 119/82 | HR 79 | Temp 98.4°F | Ht 66.0 in | Wt 188.0 lb

## 2023-06-30 DIAGNOSIS — Z114 Encounter for screening for human immunodeficiency virus [HIV]: Secondary | ICD-10-CM

## 2023-06-30 DIAGNOSIS — Z130 Encounter for screening for diseases of the blood and blood-forming organs and certain disorders involving the immune mechanism: Secondary | ICD-10-CM | POA: Diagnosis not present

## 2023-06-30 DIAGNOSIS — Z13228 Encounter for screening for other metabolic disorders: Secondary | ICD-10-CM | POA: Diagnosis not present

## 2023-06-30 DIAGNOSIS — Z1329 Encounter for screening for other suspected endocrine disorder: Secondary | ICD-10-CM

## 2023-06-30 DIAGNOSIS — Z131 Encounter for screening for diabetes mellitus: Secondary | ICD-10-CM

## 2023-06-30 DIAGNOSIS — E785 Hyperlipidemia, unspecified: Secondary | ICD-10-CM | POA: Diagnosis not present

## 2023-06-30 DIAGNOSIS — Z0000 Encounter for general adult medical examination without abnormal findings: Secondary | ICD-10-CM

## 2023-06-30 DIAGNOSIS — Z90710 Acquired absence of both cervix and uterus: Secondary | ICD-10-CM

## 2023-06-30 DIAGNOSIS — Z9229 Personal history of other drug therapy: Secondary | ICD-10-CM

## 2023-06-30 DIAGNOSIS — Z1159 Encounter for screening for other viral diseases: Secondary | ICD-10-CM

## 2023-06-30 MED ORDER — ESTRADIOL 0.5 MG PO TABS: 0.5000 mg | ORAL_TABLET | Freq: Every day | ORAL | 0 refills | Status: AC

## 2023-06-30 MED ORDER — PRAVASTATIN SODIUM 20 MG PO TABS: 20.0000 mg | ORAL_TABLET | Freq: Every day | ORAL | 0 refills | Status: DC

## 2023-06-30 NOTE — Progress Notes (Signed)
Patient states no concerns to discuss

## 2023-06-30 NOTE — Progress Notes (Signed)
Patient ID: Lynn Gonzalez, female    DOB: [birth year]  MRN: [id number]  CC: Annual Exam  Subjective: Lynn Gonzalez is a 62 y.o. female who presents for annual exam.   Her concerns today include:  - Reports total hysterectomy 11 years ago. Reports taking Estradiol 0.5 mg daily since then and needs refills.  - Needs refills on Pravastatin 20 mg daily, no issues/concerns.  - Up to date on mammogram.  Patient Active Problem List   Diagnosis Date Noted   Abnormal weight loss 05/28/2023   Generalized abdominal pain 05/28/2023   Nausea and vomiting 05/28/2023   Chronic diarrhea 12/14/2018   Paresthesias 06/25/2016   Prolonged Q-T interval on ECG 06/25/2016   Hypertension 10/24/2013   Hip pain 06/21/2013   Snoring 04/07/2012   GERD (gastroesophageal reflux disease) 01/22/2012   Gout 03/12/2011   Goiter 10/21/2010   BREAST CYST, LEFT 07/23/2010   B12 DEFICIENCY 03/08/2010   Bipolar 1 disorder (HCC) 03/08/2010   Thyrotoxicosis 02/11/2010   ANEMIA 02/11/2010     Current Outpatient Medications on File Prior to Visit  Medication Sig Dispense Refill   albuterol (VENTOLIN HFA) 108 (90 Base) MCG/ACT inhaler Inhale 1-2 puffs into the lungs every 6 (six) hours as needed for shortness of breath.     amLODipine (NORVASC) 5 MG tablet take 1 tablet by mouth once daily (Patient taking differently: Take 5 mg by mouth at bedtime.) 30 tablet 0   ammonium lactate (AMLACTIN) 12 % cream SMARTSIG:1 sparingly Topical Twice Daily     Butenafine HCl 1 % cream Apply twice a day to affected area 24 g 0   Cholecalciferol (VITAMIN D-3) 5000 units TABS Take 1 tablet by mouth daily.     clobetasol ointment (TEMOVATE) 0.05 % Apply 1 a small amount to affected area twice a day     cyclobenzaprine (FLEXERIL) 10 MG tablet Take 0.5-1 tablets (5-10 mg total) by mouth at bedtime. 30 tablet 0   DUPIXENT 300 MG/2ML SOPN Inject into the skin.     estradiol (ESTRACE) 2 MG tablet take 1 tablet by mouth once daily  (Patient taking differently: Take 2 mg by mouth daily.) 30 tablet 0   lithium 300 MG tablet Take 300-600 mg by mouth See admin instructions. Take 300mg  in the morning and 600mg  at bedtime.     potassium chloride (KLOR-CON) 10 MEQ tablet Take by mouth.     potassium chloride SA (KLOR-CON) 20 MEQ tablet Take 20 mEq by mouth daily.     QUEtiapine (SEROQUEL) 300 MG tablet Take 300 mg by mouth at bedtime.     benzonatate (TESSALON) 100 MG capsule Take 1 capsule (100 mg total) by mouth 3 (three) times daily as needed for cough. (Patient not taking: Reported on 06/30/2023) 21 capsule 0   cetirizine (ZYRTEC) 10 MG tablet Take 1 tablet (10 mg total) by mouth daily. (Patient not taking: Reported on 12/12/2020) 30 tablet 0   diclofenac Sodium (VOLTAREN) 1 % GEL Apply topically.     doxycycline (VIBRAMYCIN) 100 MG capsule Take 1 capsule (100 mg total) by mouth 2 (two) times daily. (Patient not taking: Reported on 06/30/2023) 20 capsule 0   hydrochlorothiazide (HYDRODIURIL) 12.5 MG tablet Take 12.5 mg by mouth daily. (Patient not taking: Reported on 06/30/2023)     mupirocin ointment (BACTROBAN) 2 % Apply 1 application topically daily. (Patient not taking: Reported on 06/30/2023) 22 g 0   naproxen (NAPROSYN) 500 MG tablet Take 500 mg by mouth 2 (two) times  daily. (Patient not taking: Reported on 06/30/2023)     No current facility-administered medications on file prior to visit.    Allergies  Allergen Reactions   Adhesive [Tape] Rash   Latex Rash   Tapentadol Rash   Wound Dressing Adhesive Rash    Social History   Socioeconomic History   Marital status: Single    Spouse name: Not on file   Number of children: 2   Years of education: Not on file   Highest education level: Not on file  Occupational History    Comment: Disability  Tobacco Use   Smoking status: Never    Passive exposure: Never   Smokeless tobacco: Never  Vaping Use   Vaping status: Never Used  Substance and Sexual Activity    Alcohol use: No   Drug use: No   Sexual activity: Yes    Birth control/protection: None  Other Topics Concern   Not on file  Social History Narrative   single   Regular exercise:  Yes               Social Determinants of Health   Financial Resource Strain: Not on file  Food Insecurity: Not on file  Transportation Needs: Not on file  Physical Activity: Not on file  Stress: Not on file  Social Connections: Not on file  Intimate Partner Violence: Not on file    Family History  Problem Relation Age of Onset   Glaucoma Mother    Diabetes Mother    Hypertension Mother    Cancer Father        ?type   Hypertension Father     Past Surgical History:  Procedure Laterality Date   ABDOMINAL HYSTERECTOMY  06/03/2010   BREAST BIOPSY Left    BUNIONECTOMY     bilateral   GALLBLADDER SURGERY  08/19/1983   stones removed   HYDRADENITIS EXCISION Bilateral 05/20/2017   Procedure: EXCISION BILATERAL HIDRADENITIS AXILLA;  Surgeon: Harriette Bouillon, MD;  Location: [facility] OR;  Service: General;  Laterality: Bilateral;   KNEE SURGERY  08/18/1985   bilateral   LUMBAR LAMINECTOMY/DECOMPRESSION MICRODISCECTOMY Right 01/13/2013   Procedure: MICRODISCECTOMY FORAMINOTOMY OF L4-5 ON THE RIGHT;  Surgeon: Drucilla Schmidt, MD;  Location: [facility] ORS;  Service: Orthopedics;  Laterality: Right;   MEDIASTINOSCOPY  08/19/1999   STERNOTOMY  08/19/1999   TONSILLECTOMY     TUBAL LIGATION  1985, 1996    ROS: Review of Systems Negative except as stated above  PHYSICAL EXAM: BP 119/82   Pulse 79   Temp 98.4 F (36.9 C) (Oral)   Ht 5\' 6"  (1.676 m)   Wt 188 lb (85.3 kg)   LMP 04/18/2010   SpO2 94%   BMI 30.34 kg/m   Physical Exam HENT:     Head: Normocephalic and atraumatic.     Right Ear: Tympanic membrane, ear canal and external ear normal.     Left Ear: Tympanic membrane, ear canal and external ear normal.     Nose: Nose normal.     Mouth/Throat:     Mouth: Mucous membranes are moist.      Pharynx: Oropharynx is clear.  Eyes:     Extraocular Movements: Extraocular movements intact.     Conjunctiva/sclera: Conjunctivae normal.     Pupils: Pupils are equal, round, and reactive to light.  Neck:     Thyroid: No thyroid mass, thyromegaly or thyroid tenderness.  Cardiovascular:     Rate and Rhythm: Normal rate and regular rhythm.  Pulses: Normal pulses.     Heart sounds: Normal heart sounds.  Pulmonary:     Effort: Pulmonary effort is normal.     Breath sounds: Normal breath sounds.  Chest:     Comments: Patient declined.  Abdominal:     General: Bowel sounds are normal.     Palpations: Abdomen is soft.  Genitourinary:    Comments: Patient declined.  Musculoskeletal:        General: Normal range of motion.     Right shoulder: Normal.     Left shoulder: Normal.     Right upper arm: Normal.     Left upper arm: Normal.     Right elbow: Normal.     Left elbow: Normal.     Right forearm: Normal.     Left forearm: Normal.     Right wrist: Normal.     Left wrist: Normal.     Right hand: Normal.     Left hand: Normal.     Cervical back: Normal, normal range of motion and neck supple.     Thoracic back: Normal.     Lumbar back: Normal.     Right hip: Normal.     Left hip: Normal.     Right upper leg: Normal.     Left upper leg: Normal.     Right knee: Normal.     Left knee: Normal.     Right lower leg: Normal.     Left lower leg: Normal.     Right ankle: Normal.     Left ankle: Normal.     Right foot: Normal.     Left foot: Normal.  Skin:    General: Skin is warm and dry.     Capillary Refill: Capillary refill takes less than 2 seconds.  Neurological:     General: No focal deficit present.     Mental Status: She is alert and oriented to person, place, and time.  Psychiatric:        Mood and Affect: Mood normal.        Behavior: Behavior normal.      ASSESSMENT AND PLAN: 1. Annual physical exam - Counseled on 150 minutes of exercise per week as  tolerated, healthy eating (including decreased daily intake of saturated fats, cholesterol, added sugars, sodium), STI prevention, and routine healthcare maintenance.  2. Screening for metabolic disorder - Routine screening.  - CMP14+EGFR  3. Screening for deficiency anemia - Routine screening.  - CBC  4. Hyperlipidemia, unspecified hyperlipidemia type - Continue Pravastatin as prescribed. Counseled on medication adherence/adverse effects.  - Routine screening.  - Follow-up with primary provider as scheduled.  - Lipid panel - pravastatin (PRAVACHOL) 20 MG tablet; Take 1 tablet (20 mg total) by mouth daily.  Dispense: 90 tablet; Refill: 0  5. Diabetes mellitus screening - Routine screening.  - Hemoglobin A1c  6. Thyroid disorder screen - Routine screening.  - TSH  7. Encounter for screening for HIV - Routine screening.  - HIV antibody (with reflex)  8. Need for hepatitis C screening test - Routine screening.  - Hepatitis C Antibody  9. History of hysterectomy 10. History of hormone therapy - Continue Estradiol as prescribed. Counseled on medication adherence/adverse effects.  - Referral to Gynecology for evaluation/management.  - Follow-up with primary provider as scheduled.  - estradiol (ESTRACE) 0.5 MG tablet; Take 1 tablet (0.5 mg total) by mouth daily.  Dispense: 90 tablet; Refill: 0 - Ambulatory referral to Gynecology    Patient  was given the opportunity to ask questions.  Patient verbalized understanding of the plan and was able to repeat key elements of the plan. Patient was given clear instructions to go to Emergency Department or return to medical center if symptoms don't improve, worsen, or new problems develop.The patient verbalized understanding.   Orders Placed This Encounter  Procedures   HIV antibody (with reflex)   Hepatitis C Antibody   CBC   Lipid panel   CMP14+EGFR   Hemoglobin A1c   TSH   Ambulatory referral to Gynecology     Requested  Prescriptions   Signed Prescriptions Disp Refills   estradiol (ESTRACE) 0.5 MG tablet 90 tablet 0    Sig: Take 1 tablet (0.5 mg total) by mouth daily.   pravastatin (PRAVACHOL) 20 MG tablet 90 tablet 0    Sig: Take 1 tablet (20 mg total) by mouth daily.    Return in about 1 year (around 06/29/2024) for Physical per patient preference.  Rema Fendt, NP

## 2023-07-01 LAB — HEMOGLOBIN A1C
Est. average glucose Bld gHb Est-mCnc: 120 mg/dL
Hgb A1c MFr Bld: 5.8 % — ABNORMAL HIGH (ref 4.8–5.6)

## 2023-07-01 LAB — CMP14+EGFR
ALT: 15 [IU]/L (ref 0–32)
AST: 22 [IU]/L (ref 0–40)
Albumin: 4.3 g/dL (ref 3.8–4.9)
Alkaline Phosphatase: 113 [IU]/L (ref 44–121)
BUN/Creatinine Ratio: 11 — ABNORMAL LOW (ref 12–28)
BUN: 11 mg/dL (ref 8–27)
Bilirubin Total: 1.2 mg/dL (ref 0.0–1.2)
CO2: 25 mmol/L (ref 20–29)
Calcium: 9.9 mg/dL (ref 8.7–10.3)
Chloride: 102 mmol/L (ref 96–106)
Creatinine, Ser: 0.96 mg/dL (ref 0.57–1.00)
Globulin, Total: 2.6 g/dL (ref 1.5–4.5)
Glucose: 87 mg/dL (ref 70–99)
Potassium: 4 mmol/L (ref 3.5–5.2)
Sodium: 138 mmol/L (ref 134–144)
Total Protein: 6.9 g/dL (ref 6.0–8.5)
eGFR: 68 mL/min/{1.73_m2} (ref >59)

## 2023-07-01 LAB — LIPID PANEL
Chol/HDL Ratio: 2.5 ratio (ref 0.0–4.4)
Cholesterol, Total: 157 mg/dL (ref 100–199)
HDL: 64 mg/dL (ref >39)
LDL Chol Calc (NIH): 74 mg/dL (ref 0–99)
Triglycerides: 109 mg/dL (ref 0–149)
VLDL Cholesterol Cal: 19 mg/dL (ref 5–40)

## 2023-07-01 LAB — HEPATITIS C ANTIBODY: Hep C Virus Ab: NONREACTIVE

## 2023-07-01 LAB — CBC
Hematocrit: 40.6 % (ref 34.0–46.6)
Hemoglobin: 12.5 g/dL (ref 11.1–15.9)
MCH: 26.4 pg — ABNORMAL LOW (ref 26.6–33.0)
MCHC: 30.8 g/dL — ABNORMAL LOW (ref 31.5–35.7)
MCV: 86 fL (ref 79–97)
Platelets: 207 10*3/uL (ref 150–450)
RBC: 4.73 x10E6/uL (ref 3.77–5.28)
RDW: 12.8 % (ref 11.7–15.4)
WBC: 9.9 10*3/uL (ref 3.4–10.8)

## 2023-07-01 LAB — HIV ANTIBODY (ROUTINE TESTING W REFLEX): HIV Screen 4th Generation wRfx: NONREACTIVE

## 2023-07-01 LAB — TSH: TSH: 1.25 u[IU]/mL (ref 0.450–4.500)

## 2023-08-14 ENCOUNTER — Other Ambulatory Visit: Payer: Self-pay | Admitting: Family

## 2023-08-14 DIAGNOSIS — E785 Hyperlipidemia, unspecified: Secondary | ICD-10-CM

## 2023-09-02 ENCOUNTER — Encounter: Payer: Self-pay | Admitting: Emergency Medicine

## 2023-09-02 ENCOUNTER — Ambulatory Visit
Admission: EM | Admit: 2023-09-02 | Discharge: 2023-09-02 | Disposition: A | Discharge status: Home / Self Care | Payer: 59

## 2023-09-02 DIAGNOSIS — B372 Candidiasis of skin and nail: Secondary | ICD-10-CM | POA: Diagnosis not present

## 2023-09-02 MED ORDER — CICLOPIROX OLAMINE 0.77 % EX CREA: TOPICAL_CREAM | Freq: Two times a day (BID) | CUTANEOUS | 0 refills | Status: DC

## 2023-09-02 MED ORDER — NYSTATIN 100000 UNIT/GM EX POWD: 1.0000 | Freq: Two times a day (BID) | CUTANEOUS | 0 refills | Status: AC

## 2023-09-02 NOTE — ED Triage Notes (Signed)
 Pt reports rash underneath bilateral breasts. L is worse than R breast. Excoriated area with open skin underneath breast that has worsened over the last 2 weeks. Pt reports trying several different creams to area at home (unknown names) but it continued to get worse. Pt reports only known cause she can pinpoint is spilling a cleaning bucket of bleach cleaning solution over area.

## 2023-09-02 NOTE — ED Provider Notes (Signed)
 [facility]    CSN: [id number] Arrival date & time: 09/02/23  0830      History   Chief Complaint Chief Complaint  Patient presents with   Rash    HPI Lynn Gonzalez is a 63 y.o. female.   Patient presents today with a several week history of rash under bilateral breasts.  She reports the area is intensely pruritic.  She denies any changes to personal hygiene products including soaps or detergents.  Denies any history of diabetes or immunosuppression.  Denies any recent antibiotics.  She has tried multiple over-the-counter medications but cannot member specifically what they were treating.  She does have a history of eczema and has been treated with Dupixent but is not currently following with dermatology.  She does report that several days ago she was using a bleach solution to clean once some of it splashed up onto her.  She is unsure if it caught onto this area but reports that the rash had already been present for several weeks.  She denies any fever, nausea, vomiting.  Denies episodes of similar symptoms in the past.    Past Medical History:  Diagnosis Date   ANEMIA    Anxiety    Asthma    B12 DEFICIENCY    Bipolar 1 disorder (HCC) 1985   BREAST CYST, LEFT    Breast tumor    GERD (gastroesophageal reflux disease)    GOITER    H/O cardiovascular stress test    Lex MV 4/14:  No ischemia, EF 72%, normal wall motion   Hypertension    Hyperthyroidism    INTERSTITIAL LUNG DISEASE    Irritable bowel syndrome with diarrhea     Patient Active Problem List   Diagnosis Date Noted   Abnormal weight loss 05/28/2023   Generalized abdominal pain 05/28/2023   Nausea and vomiting 05/28/2023   Chronic diarrhea 12/14/2018   Paresthesias 06/25/2016   Prolonged Q-T interval on ECG 06/25/2016   Hypertension 10/24/2013   Hip pain 06/21/2013   Snoring 04/07/2012   GERD (gastroesophageal reflux disease) 01/22/2012   Gout 03/12/2011   Goiter 10/21/2010   BREAST  CYST, LEFT 07/23/2010   B12 DEFICIENCY 03/08/2010   Bipolar 1 disorder (HCC) 03/08/2010   Thyrotoxicosis 02/11/2010   ANEMIA 02/11/2010    Past Surgical History:  Procedure Laterality Date   ABDOMINAL HYSTERECTOMY  06/03/2010   BREAST BIOPSY Left    BUNIONECTOMY     bilateral   GALLBLADDER SURGERY  08/19/1983   stones removed   HYDRADENITIS EXCISION Bilateral 05/20/2017   Procedure: EXCISION BILATERAL HIDRADENITIS AXILLA;  Surgeon: Sim Dryer, MD;  Location: [facility] OR;  Service: General;  Laterality: Bilateral;   KNEE SURGERY  08/18/1985   bilateral   LUMBAR LAMINECTOMY/DECOMPRESSION MICRODISCECTOMY Right 01/13/2013   Procedure: MICRODISCECTOMY FORAMINOTOMY OF L4-5 ON THE RIGHT;  Surgeon: Verlinda Gloss, MD;  Location: [facility];  Service: Orthopedics;  Laterality: Right;   MEDIASTINOSCOPY  08/19/1999   STERNOTOMY  08/19/1999   TONSILLECTOMY     TUBAL LIGATION  1985, 1996    OB History   No obstetric history on file.      Home Medications    Prior to Admission medications   Medication Sig Start Date End Date Taking? Authorizing Provider  amLODipine  (NORVASC ) 5 MG tablet take 1 tablet by mouth once daily Patient taking differently: Take 5 mg by mouth at bedtime. 08/23/14  Yes Dorrene Gaucher, NP  Cholecalciferol (VITAMIN D-3) 5000 units TABS Take 1 tablet  by mouth daily.   Yes [provider]  ciclopirox  (LOPROX ) 0.77 % cream Apply topically 2 (two) times daily. 09/02/23  Yes Nanetta Wiegman K, PA-C  DUPIXENT 300 MG/2ML SOPN Inject into the skin. 04/27/23  Yes [provider]  estradiol  (ESTRACE ) 0.5 MG tablet Take 1 tablet (0.5 mg total) by mouth daily. 06/30/23  Yes Rogerio Clay, Amy J, NP  lithium  300 MG tablet Take 300-600 mg by mouth See admin instructions. Take 300mg  in the morning and 600mg  at bedtime.   Yes [provider]  nystatin  (MYCOSTATIN /NYSTOP ) powder Apply 1 Application topically 2 (two) times daily. 09/02/23  Yes Rowen Wilmer K, PA-C   potassium chloride  (KLOR-CON ) 10 MEQ tablet Take by mouth.   Yes [provider]  pravastatin  (PRAVACHOL ) 20 MG tablet TAKE 1 TABLET(20 MG) BY MOUTH DAILY 08/14/23  Yes Rogerio Clay, Amy J, NP  QUEtiapine  (SEROQUEL ) 300 MG tablet Take 300 mg by mouth at bedtime.   Yes [provider]  albuterol  (VENTOLIN  HFA) 108 (90 Base) MCG/ACT inhaler Inhale 1-2 puffs into the lungs every 6 (six) hours as needed for shortness of breath. 10/01/19   [provider]  ammonium lactate (AMLACTIN) 12 % cream SMARTSIG:1 sparingly Topical Twice Daily 03/10/23   [provider]  benzonatate  (TESSALON ) 100 MG capsule Take 1 capsule (100 mg total) by mouth 3 (three) times daily as needed for cough. Patient not taking: Reported on 06/30/2023 10/28/22   Ann Keto, MD  cetirizine  (ZYRTEC ) 10 MG tablet Take 1 tablet (10 mg total) by mouth daily. Patient not taking: Reported on 12/12/2020 05/15/20   Jerri Morale A, NP  clobetasol ointment (TEMOVATE) 0.05 % Apply 1 a small amount to affected area twice a day 03/06/23   [provider]  colesevelam (WELCHOL) 625 MG tablet Take by mouth. Patient not taking: Reported on 09/02/2023 08/14/23   [provider]  diclofenac  Sodium (VOLTAREN ) 1 % GEL Apply topically. Patient not taking: Reported on 09/02/2023 12/12/22   [provider]  estradiol  (ESTRACE ) 2 MG tablet take 1 tablet by mouth once daily Patient not taking: Reported on 09/02/2023 08/23/14   O'Sullivan, Melissa, NP  hydrochlorothiazide (HYDRODIURIL) 12.5 MG tablet Take 12.5 mg by mouth daily. Patient not taking: Reported on 06/30/2023 01/01/23   [provider]    Family History Family History  Problem Relation Age of Onset   Glaucoma Mother    Diabetes Mother    Hypertension Mother    Cancer Father        ?type   Hypertension Father     Social History Social History   Tobacco Use   Smoking status: Never    Passive exposure: Never    Smokeless tobacco: Never  Vaping Use   Vaping status: Never Used  Substance Use Topics   Alcohol use: No   Drug use: No     Allergies   Adhesive [tape], Latex, Tapentadol, and Wound dressing adhesive   Review of Systems Review of Systems  Constitutional:  Negative for activity change, appetite change, fatigue and fever.  Gastrointestinal:  Negative for abdominal pain, diarrhea, nausea and vomiting.  Musculoskeletal:  Negative for arthralgias and myalgias.  Skin:  Positive for rash.     Physical Exam Triage Vital Signs ED Triage Vitals  Encounter Vitals Group     BP 09/02/23 0849 126/84     Systolic BP Percentile --      Diastolic BP Percentile --      Pulse Rate 09/02/23 0849  84     Resp 09/02/23 0849 18     Temp 09/02/23 0849 97.9 F (36.6 C)     Temp Source 09/02/23 0849 Oral     SpO2 09/02/23 0849 95 %     Weight --      Height --      Head Circumference --      Peak Flow --      Pain Score 09/02/23 0851 6     Pain Loc --      Pain Education --      Exclude from Growth Chart --    No data found.  Updated Vital Signs BP 126/84 (BP Location: Right Arm)   Pulse 84   Temp 97.9 F (36.6 C) (Oral)   Resp 18   LMP 04/18/2010   SpO2 95%   Visual Acuity Right Eye Distance:   Left Eye Distance:   Bilateral Distance:    Right Eye Near:   Left Eye Near:    Bilateral Near:     Physical Exam Vitals reviewed.  Constitutional:      General: She is awake. She is not in acute distress.    Appearance: Normal appearance. She is well-developed. She is not ill-appearing.     Comments: Very pleasant female appears stated age in no acute distress sitting comfortably in exam room  HENT:     Head: Normocephalic and atraumatic.  Cardiovascular:     Rate and Rhythm: Normal rate and regular rhythm.     Heart sounds: Normal heart sounds, S1 normal and S2 normal. No murmur heard. Pulmonary:     Effort: Pulmonary effort is normal.     Breath sounds: Normal breath  sounds. No wheezing, rhonchi or rales.     Comments: Clear to auscultation bilaterally Abdominal:     Palpations: Abdomen is soft.     Tenderness: There is no abdominal tenderness.  Skin:    Findings: Erythema and rash present. Rash is macular and papular.     Comments: Beefy erythematous rash of skin folds under breast tissue with satellite lesion and evidence of excoriation.  No additional lesions or rash noted.  Psychiatric:        Behavior: Behavior is cooperative.      UC Treatments / Results  Labs (all labs ordered are listed, but only abnormal results are displayed) Labs Reviewed - No data to display  EKG   Radiology No results found.  Procedures Procedures (including critical care time)  Medications Ordered in UC Medications - No data to display  Initial Impression / Assessment and Plan / UC Course  I have reviewed the triage vital signs and the nursing notes.  Pertinent labs & imaging results that were available during my care of the patient were reviewed by me and considered in my medical decision making (see chart for details).     Patient is well-appearing, afebrile, nontoxic, nontachycardic.  Concern for intertrigo as etiology of symptoms.  She was encouraged to keep the area clean and dry as much as possible.  She was started on nystatin  powder as well as Loprox  twice daily.  We discussed that she should continue using these medications for minimum 2 weeks even if her symptoms are improving sooner.  Will defer Diflucan given her history of QT prolongation and multiple other QT prolonging medications.  She is established with dermatologist and we discussed that if her symptoms are not improving quickly she should follow-up with them.  We discussed that if anything  worsens and she has spread of rash, fever, nausea, vomiting she should be seen emergently.  Strict return precautions given.  Final Clinical Impressions(s) / UC Diagnoses   Final diagnoses:   Candidiasis, intertrigo     Discharge Instructions      Keep this area clean with soap and water and dry.  Apply Loprox  twice daily for minimum of 2 weeks.  You can also apply some nystatin  powder to help treat this infection.  If your symptoms are not improving quickly please return for reevaluation.  If anything worsens and you have spread of rash, pain/itching, fever, nausea, vomiting you should be seen immediately.  Follow-up with your primary care.     ED Prescriptions     Medication Sig Dispense Auth. Provider   ciclopirox  (LOPROX ) 0.77 % cream Apply topically 2 (two) times daily. 90 g Riggin Cuttino K, PA-C   nystatin  (MYCOSTATIN /NYSTOP ) powder Apply 1 Application topically 2 (two) times daily. 60 g Eisley Barber K, PA-C      PDMP not reviewed this encounter.   Budd Cargo, PA-C 09/02/23 1610

## 2023-09-02 NOTE — Discharge Instructions (Signed)
 Keep this area clean with soap and water and dry.  Apply Loprox  twice daily for minimum of 2 weeks.  You can also apply some nystatin  powder to help treat this infection.  If your symptoms are not improving quickly please return for reevaluation.  If anything worsens and you have spread of rash, pain/itching, fever, nausea, vomiting you should be seen immediately.  Follow-up with your primary care.

## 2023-09-27 ENCOUNTER — Ambulatory Visit
Admission: EM | Admit: 2023-09-27 | Discharge: 2023-09-27 | Disposition: A | Discharge status: Home / Self Care | Payer: 59 | Attending: Physician Assistant | Admitting: Physician Assistant

## 2023-09-27 ENCOUNTER — Other Ambulatory Visit: Payer: Self-pay

## 2023-09-27 ENCOUNTER — Ambulatory Visit (INDEPENDENT_AMBULATORY_CARE_PROVIDER_SITE_OTHER): Payer: 59

## 2023-09-27 ENCOUNTER — Encounter: Payer: Self-pay | Admitting: *Deleted

## 2023-09-27 DIAGNOSIS — M25571 Pain in right ankle and joints of right foot: Secondary | ICD-10-CM

## 2023-09-27 DIAGNOSIS — S93401A Sprain of unspecified ligament of right ankle, initial encounter: Secondary | ICD-10-CM | POA: Diagnosis not present

## 2023-09-27 NOTE — Discharge Instructions (Addendum)
 Return if any problems.

## 2023-09-27 NOTE — ED Provider Notes (Signed)
 [facility]    CSN: [id number] Arrival date & time: 09/27/23  0808      History   Chief Complaint Chief Complaint  Patient presents with   Fall    HPI Lynn Gonzalez is a 63 y.o. female.   Patient reports that she fell on Thursday.  Patient reports that she tripped.  Patient complains of pain in her ankle.  Patient reports she also has some soreness in her hip with walking.  Patient denies any loss of consciousness.  She has been able to ambulate with minimal difficulty.  Patient is concerned primarily about her ankle  The history is provided by the patient. No language interpreter was used.  Fall    Past Medical History:  Diagnosis Date   ANEMIA    Anxiety    Asthma    B12 DEFICIENCY    Bipolar 1 disorder (HCC) 1985   BREAST CYST, LEFT    Breast tumor    GERD (gastroesophageal reflux disease)    GOITER    H/O cardiovascular stress test    Lex MV 4/14:  No ischemia, EF 72%, normal wall motion   Hypertension    Hyperthyroidism    INTERSTITIAL LUNG DISEASE    Irritable bowel syndrome with diarrhea     Patient Active Problem List   Diagnosis Date Noted   Abnormal weight loss 05/28/2023   Generalized abdominal pain 05/28/2023   Nausea and vomiting 05/28/2023   Chronic diarrhea 12/14/2018   Paresthesias 06/25/2016   Prolonged Q-T interval on ECG 06/25/2016   Hypertension 10/24/2013   Hip pain 06/21/2013   Snoring 04/07/2012   GERD (gastroesophageal reflux disease) 01/22/2012   Gout 03/12/2011   Goiter 10/21/2010   BREAST CYST, LEFT 07/23/2010   B12 DEFICIENCY 03/08/2010   Bipolar 1 disorder (HCC) 03/08/2010   Thyrotoxicosis 02/11/2010   ANEMIA 02/11/2010    Past Surgical History:  Procedure Laterality Date   ABDOMINAL HYSTERECTOMY  06/03/2010   BREAST BIOPSY Left    BUNIONECTOMY     bilateral   GALLBLADDER SURGERY  08/19/1983   stones removed   HYDRADENITIS EXCISION Bilateral 05/20/2017   Procedure: EXCISION BILATERAL HIDRADENITIS  AXILLA;  Surgeon: Vanderbilt Ned, MD;  Location: [facility];  Service: General;  Laterality: Bilateral;   KNEE SURGERY  08/18/1985   bilateral   LUMBAR LAMINECTOMY/DECOMPRESSION MICRODISCECTOMY Right 01/13/2013   Procedure: MICRODISCECTOMY FORAMINOTOMY OF L4-5 ON THE RIGHT;  Surgeon: Lynwood SHAUNNA Bern, MD;  Location: [facility] ORS;  Service: Orthopedics;  Laterality: Right;   MEDIASTINOSCOPY  08/19/1999   STERNOTOMY  08/19/1999   TONSILLECTOMY     TUBAL LIGATION  1985, 1996    OB History   No obstetric history on file.      Home Medications    Prior to Admission medications   Medication Sig Start Date End Date Taking? Authorizing Provider  albuterol  (VENTOLIN  HFA) 108 (90 Base) MCG/ACT inhaler Inhale 1-2 puffs into the lungs every 6 (six) hours as needed for shortness of breath. 10/01/19  Yes [provider]  amLODipine  (NORVASC ) 5 MG tablet take 1 tablet by mouth once daily Patient taking differently: Take 5 mg by mouth at bedtime. 08/23/14  Yes Daryl Setter, NP  Cholecalciferol (VITAMIN D-3) 5000 units TABS Take 1 tablet by mouth daily.   Yes [provider]  estradiol  (ESTRACE ) 0.5 MG tablet Take 1 tablet (0.5 mg total) by mouth daily. 06/30/23  Yes Lorren, Amy J, NP  lithium  300 MG tablet Take 300-600 mg by  mouth See admin instructions. Take 300mg  in the morning and 600mg  at bedtime.   Yes [provider]  pravastatin  (PRAVACHOL ) 20 MG tablet TAKE 1 TABLET(20 MG) BY MOUTH DAILY 08/14/23  Yes Lorren, Amy J, NP  QUEtiapine  (SEROQUEL ) 300 MG tablet Take 300 mg by mouth at bedtime.   Yes [provider]  ammonium lactate (AMLACTIN) 12 % cream SMARTSIG:1 sparingly Topical Twice Daily 03/10/23   [provider]  benzonatate  (TESSALON ) 100 MG capsule Take 1 capsule (100 mg total) by mouth 3 (three) times daily as needed for cough. Patient not taking: Reported on 06/30/2023 10/28/22   Vonna Sharlet POUR, MD  cetirizine  (ZYRTEC ) 10 MG tablet Take 1  tablet (10 mg total) by mouth daily. Patient not taking: Reported on 12/12/2020 05/15/20   Adah Corning A, FNP  ciclopirox  (LOPROX ) 0.77 % cream Apply topically 2 (two) times daily. 09/02/23   Raspet, Erin K, PA-C  clobetasol ointment (TEMOVATE) 0.05 % Apply 1 a small amount to affected area twice a day 03/06/23   [provider]  colesevelam (WELCHOL) 625 MG tablet Take by mouth. Patient not taking: Reported on 09/02/2023 08/14/23   [provider]  diclofenac  Sodium (VOLTAREN ) 1 % GEL Apply topically. Patient not taking: Reported on 09/02/2023 12/12/22   [provider]  DUPIXENT 300 MG/2ML SOPN Inject into the skin. 04/27/23   [provider]  estradiol  (ESTRACE ) 2 MG tablet take 1 tablet by mouth once daily Patient not taking: Reported on 09/02/2023 08/23/14   O'Sullivan, Melissa, NP  hydrochlorothiazide (HYDRODIURIL) 12.5 MG tablet Take 12.5 mg by mouth daily. Patient not taking: Reported on 06/30/2023 01/01/23   [provider]  nystatin  (MYCOSTATIN /NYSTOP ) powder Apply 1 Application topically 2 (two) times daily. 09/02/23   Raspet, Erin K, PA-C  potassium chloride  (KLOR-CON ) 10 MEQ tablet Take by mouth.    [provider]    Family History Family History  Problem Relation Age of Onset   Glaucoma Mother    Diabetes Mother    Hypertension Mother    Cancer Father        ?type   Hypertension Father     Social History Social History   Tobacco Use   Smoking status: Never    Passive exposure: Never   Smokeless tobacco: Never  Vaping Use   Vaping status: Never Used  Substance Use Topics   Alcohol use: No   Drug use: No     Allergies   Adhesive [tape], Latex, Tapentadol, and Wound dressing adhesive   Review of Systems Review of Systems  All other systems reviewed and are negative.    Physical Exam Triage Vital Signs ED Triage Vitals  Encounter Vitals Group     BP 09/27/23 0843 133/84     Systolic BP Percentile --       Diastolic BP Percentile --      Pulse Rate 09/27/23 0843 82     Resp 09/27/23 0843 16     Temp 09/27/23 0843 98.7 F (37.1 C)     Temp Source 09/27/23 0843 Oral     SpO2 09/27/23 0843 96 %     Weight --      Height --      Head Circumference --      Peak Flow --      Pain Score 09/27/23 0840 8     Pain Loc --      Pain Education --      Exclude from Growth Chart --  No data found.  Updated Vital Signs BP 133/84 (BP Location: Right Arm)   Pulse 82   Temp 98.7 F (37.1 C) (Oral)   Resp 16   LMP 04/18/2010   SpO2 96%   Visual Acuity Right Eye Distance:   Left Eye Distance:   Bilateral Distance:    Right Eye Near:   Left Eye Near:    Bilateral Near:     Physical Exam Vitals reviewed.  Constitutional:      Appearance: Normal appearance.  Musculoskeletal:        General: Swelling and tenderness present.     Comments: Tender right ankle, pain with range of motion.  Diffusely tender right thigh right hip patient is able to ambulate without difficulty.  Skin:    General: Skin is warm.  Neurological:     General: No focal deficit present.     Mental Status: She is alert.      UC Treatments / Results  Labs (all labs ordered are listed, but only abnormal results are displayed) Labs Reviewed - No data to display  EKG   Radiology DG Ankle Complete Right Result Date: 09/27/2023 CLINICAL DATA:  Injury.  Pain. EXAM: RIGHT ANKLE - COMPLETE 3+ VIEW COMPARISON:  No comparison studies available. FINDINGS: No evidence for an acute fracture. No subluxation or dislocation. Fixation wires are noted in the first metatarsal. IMPRESSION: No acute bony findings. Electronically Signed   By: Camellia Candle M.D.   On: 09/27/2023 10:07    Procedures Procedures (including critical care time)  Medications Ordered in UC Medications - No data to display  Initial Impression / Assessment and Plan / UC Course  I have reviewed the triage vital signs and the nursing notes.  Pertinent  labs & imaging results that were available during my care of the patient were reviewed by me and considered in my medical decision making (see chart for details).     X-ray right ankle shows no evidence of fracture.  Patient placed in an Ace wrap.  Patient advised to follow-up with primary care Final Clinical Impressions(s) / UC Diagnoses   Final diagnoses:  Pain in joint involving right ankle and foot  Mild ankle sprain, right, initial encounter     Discharge Instructions      Return if any problems.    ED Prescriptions   None    PDMP not reviewed this encounter. An After Visit Summary was printed and given to the patient.       Flint Sonny POUR, [place] 09/27/23 1145

## 2023-09-27 NOTE — ED Triage Notes (Signed)
 Pt reports she fell on Thursday- thinks she may have tripped over the vacuum cleaner. States "I laid on the floor about 40 minutes before I could get up". C/o right hip and leg pain, ankle is the worse. Ambulated to triage without difficulty

## 2023-12-15 ENCOUNTER — Other Ambulatory Visit: Payer: Self-pay | Admitting: Family

## 2023-12-15 DIAGNOSIS — Z9229 Personal history of other drug therapy: Secondary | ICD-10-CM

## 2023-12-15 DIAGNOSIS — Z90710 Acquired absence of both cervix and uterus: Secondary | ICD-10-CM

## 2023-12-15 NOTE — Telephone Encounter (Signed)
 Call patient with update. Patient referred to Gynecology for management of Estradiol  hormone therapy. Confirm if patient established with Gynecology.

## 2023-12-18 NOTE — Telephone Encounter (Signed)
 Call patient with update. Patient referred to Gynecology for management of Estradiol  hormone therapy. Confirm if patient established with Gynecology.

## 2024-02-09 ENCOUNTER — Other Ambulatory Visit: Payer: Self-pay | Admitting: Family

## 2024-02-09 ENCOUNTER — Encounter: Payer: Self-pay | Admitting: Family

## 2024-02-09 DIAGNOSIS — Z1231 Encounter for screening mammogram for malignant neoplasm of breast: Secondary | ICD-10-CM

## 2024-02-24 ENCOUNTER — Encounter: Payer: Self-pay | Admitting: Podiatry

## 2024-02-24 ENCOUNTER — Ambulatory Visit (INDEPENDENT_AMBULATORY_CARE_PROVIDER_SITE_OTHER): Admitting: Podiatry

## 2024-02-24 DIAGNOSIS — M79671 Pain in right foot: Secondary | ICD-10-CM

## 2024-02-24 DIAGNOSIS — B351 Tinea unguium: Secondary | ICD-10-CM

## 2024-02-24 DIAGNOSIS — M79672 Pain in left foot: Secondary | ICD-10-CM

## 2024-02-24 DIAGNOSIS — L600 Ingrowing nail: Secondary | ICD-10-CM | POA: Diagnosis not present

## 2024-02-24 NOTE — Progress Notes (Signed)
 Patient presents for evaluation and treatment of tenderness and some redness around nails feet.  Tenderness around toes with walking and wearing shoes.  Had the hallux nails removed several years ago but the sides grew back.  She has been having problems with painful with walking and wearing shoes.  Physical exam:  General appearance: Alert, pleasant, and in no acute distress.  Vascular: Pedal pulses: DP 2/4 B/L, PT 2/4 B/L.  Mild edema lower legs bilaterally.  Capillary refill time immediate bilaterally  Neurological:  Light touch and check normal Achilles  tendon reflex bilaterally  Dermatologic:  Nails thickened, disfigured, discolored 1-5 BL with subungual debris.  Redness and hypertrophic nail folds along nail folds bilaterally but no signs of drainage or infection.  Hallux nails are especially ingrown with thick spicules growing in on the medial lateral borders of the hallux bilaterally.  Thickening of the nail fold with redness along the nail folds.  Musculoskeletal:  Tenderness distal aspect hallux bilaterally.  Good range of motion ankle joint subtalar joint and first MTP.  Normal muscle strength around foot and ankle   Diagnosis: 1. Painful onychomycotic nails 1 through 5 bilaterally. 2. Pain toes 1 through 5 bilaterally. 3.  Ingrown nails hallux bilaterally  Plan: -New patient office visit level 3 for evaluation and management.  Modifier 25. -Discussed the ingrown nails and regrowth of the nail borders on the hallux bilaterally.  Recommended matrixectomy on the nail borders to alleviate pain from these.  Long-term with the other nails would recommend Lamisil once we do the nails we can do the start the Lamisil. - Debrided onychomycotic nails 1 through 5 bilaterally.   Return 2 weeks Matrixectomy hallux nail left, then 1 RT, then Lamisil

## 2024-02-26 ENCOUNTER — Ambulatory Visit
Admission: RE | Admit: 2024-02-26 | Discharge: 2024-02-26 | Disposition: A | Discharge status: Home / Self Care | Source: Ambulatory Visit | Attending: Family | Admitting: Family

## 2024-02-26 DIAGNOSIS — Z1231 Encounter for screening mammogram for malignant neoplasm of breast: Secondary | ICD-10-CM

## 2024-03-02 ENCOUNTER — Ambulatory Visit: Payer: Self-pay | Admitting: Family

## 2024-03-11 ENCOUNTER — Telehealth: Payer: Self-pay | Admitting: Podiatry

## 2024-03-11 ENCOUNTER — Ambulatory Visit: Admitting: Podiatry

## 2024-03-11 DIAGNOSIS — L600 Ingrowing nail: Secondary | ICD-10-CM

## 2024-03-11 NOTE — Progress Notes (Signed)
 Patient complains of painful ingrown thickened nail with dystrophy and gryphotic changes hallux left. Patient denies fevers, chills, nausea, vomiting.  Objective:  Vitals: Reviewed  General: Well developed, nourished, in no acute distress, alert and oriented x3   Vascular: DP pulse 2/4 bilateral. PT pulse 2/4 bilateral.  Mild edema hallux left  Dermatology: Erythema, edema, incurvated nail border with dystrophic and gryphotic changes hallux left with no drainage . Tenderness present with palpation . Normal skin tone and texture feet with normal hair growth.  Neurological: Grossly intact. Normal reflexes.   Musculoskeletal: Tenderness with palpation of the distal hallux left. No tenderness or painful ROM at IPJ.  Diagnosis: Ingrown nail hallux nail left  Plan: -discussed etiology and treatment of ingrown nails. Discussed surgical vs conservative treatment. -Consent signed for appropriate matrixectomy affected nail(s).   Procedure(s):   - Matrixectomy(s) hallux nail left: Toe anesthetized with 3cc 2:1 mixture 2% Lidocaine  with epinephrine : Sodium Bicarbonate. Surgical site prepped. Digital tourniquet applied.  Avulsion of nail plate. performed. Matrixecomy performed with three 30 second applications of phenol to nail matrix. Site irrigated with alcohol.  Tourniquet released with good vascularity noticed in digit.  Applied triple antibiotic to nailbed and applied gauze and Coban dressing. - Written and oral postoperative instructions given.  -Return for post-op 2 weeks and possible matrixectomy second nail left or hallux right  J Prentice Binder, DPM

## 2024-03-11 NOTE — Telephone Encounter (Signed)
 Patient called requesting to speak with a nurse regarding post-procedure care following toenail removal. I asked if she received the After Visit Summary (AVS); patient began yelling that she did but stated she did not understand the instructions. I asked to place her on hold to review the chart and found no written instructions documented, though it was noted that instructions were provided in person. When I attempted to explain that a nurse would need to follow up with her, the patient interrupted, continued yelling, and then disconnected the call.

## 2024-03-11 NOTE — Patient Instructions (Signed)

## 2024-03-29 ENCOUNTER — Ambulatory Visit (INDEPENDENT_AMBULATORY_CARE_PROVIDER_SITE_OTHER): Admitting: Podiatry

## 2024-03-29 ENCOUNTER — Encounter: Payer: Self-pay | Admitting: Podiatry

## 2024-03-29 DIAGNOSIS — L600 Ingrowing nail: Secondary | ICD-10-CM | POA: Diagnosis not present

## 2024-03-29 NOTE — Progress Notes (Signed)
 Patient complains of painful ingrown both border(s) toe 1 right.  Nail thickened and painful .patient denies fevers, chills, nausea, vomiting.  Doing well with the nail surgery site on the left  Objective:  Vitals: Reviewed  General: Well developed, nourished, in no acute distress, alert and oriented x3   Vascular: DP pulse 2/4 bilateral. PT pulse 1/4 bilateral.  Mild edema lower extremity bilaterally  Dermatology: Erythema, edema, nearly gryphotic nail plate, incurvated nail border both hallux right with no drainage . Tenderness present with palpatio severely  Normal skin tone and texture feet with normal hair growth.  Neurological: Grossly intact. Normal reflexes.   Musculoskeletal: Tenderness with palpation of the distal hallux right. No tenderness or painful ROM at IPJ.  Diagnosis: Ingrown nail hallux nail right-  Plan: -discussed etiology and treatment of ingrown nails. Discussed surgical vs conservative treatment. -Consent signed for appropriate matrixectomy affected nail(s).   Procedure(s):   - Matrixectomy(s) hallux nail right: Toe anesthetized with 3cc 2:1 mixture 2% Lidocaine  with epinephrine : Sodium Bicarbonate. Surgical site prepped. Digital tourniquet applied.  Avulsion of nail plate. performed. Matrixecomy performed with three 30 second applications of phenol to nail matrix. Site irrigated with alcohol.  Tourniquet released with good vascularity noticed in digit.  Applied triple antibiotic to nailbed and applied gauze and Coban dressing. - Written and oral postoperative instructions given.  -Return for post-op 2 weeks.  Consider nail surgery on one of the second toes.   JINNY Prentice Binder, DPM

## 2024-03-29 NOTE — Patient Instructions (Signed)

## 2024-04-12 ENCOUNTER — Ambulatory Visit: Admitting: Podiatry

## 2024-04-14 ENCOUNTER — Telehealth: Payer: Self-pay | Admitting: Family

## 2024-04-14 NOTE — Telephone Encounter (Signed)
 Copied from CRM [id number]. Topic: Referral - Request for Referral >> Apr 14, 2024 10:24 AM Zebedee SAUNDERS wrote: Did the patient discuss referral with their provider in the last year? Yes (If No - schedule appointment) (If Yes - send message)  Appointment offered? Yes  Type of order/referral and detailed reason for visit: OBGYN , Annual Well woman exam   Preference of office, provider, location: [facility] [address] [place], [place] [address] [phone]  If referral order, have you been seen by this specialty before? No (If Yes, this issue or another issue? When? Where?  Can we respond through MyChart? No

## 2024-04-15 NOTE — Telephone Encounter (Signed)
 Pt scheduled

## 2024-04-21 ENCOUNTER — Ambulatory Visit (INDEPENDENT_AMBULATORY_CARE_PROVIDER_SITE_OTHER): Admitting: Family

## 2024-04-21 ENCOUNTER — Encounter: Payer: Self-pay | Admitting: Family

## 2024-04-21 VITALS — BP 126/82 | HR 86 | Temp 98.0°F | Resp 16 | Ht 66.0 in | Wt 185.6 lb

## 2024-04-21 DIAGNOSIS — R21 Rash and other nonspecific skin eruption: Secondary | ICD-10-CM | POA: Diagnosis not present

## 2024-04-21 DIAGNOSIS — Z124 Encounter for screening for malignant neoplasm of cervix: Secondary | ICD-10-CM

## 2024-04-21 MED ORDER — TRIAMCINOLONE ACETONIDE 0.025 % EX CREA
1.0000 | TOPICAL_CREAM | Freq: Two times a day (BID) | CUTANEOUS | 2 refills | Status: AC
Start: 2024-04-21 — End: ?

## 2024-04-21 NOTE — Progress Notes (Signed)
 Patient ID: Lynn Gonzalez, female    DOB: 1961/05/16  MRN: [id number]  CC: Referral  Subjective: Lynn Gonzalez is a 63 y.o. female who presents for referral.   Her concerns today include:  - Requests referral to Gynecology for routine exam.  - States dark spots on bilateral lower extremities comes and goes for several months. Denies red flag symptoms. States she is established with Dermatology and plans to schedule appointment soon.  Patient Active Problem List   Diagnosis Date Noted   Abnormal weight loss 05/28/2023   Generalized abdominal pain 05/28/2023   Nausea and vomiting 05/28/2023   Chronic diarrhea 12/14/2018   Paresthesias 06/25/2016   Prolonged Q-T interval on ECG 06/25/2016   Hypertension 10/24/2013   Hip pain 06/21/2013   Snoring 04/07/2012   GERD (gastroesophageal reflux disease) 01/22/2012   Gout 03/12/2011   Goiter 10/21/2010   BREAST CYST, LEFT 07/23/2010   B12 DEFICIENCY 03/08/2010   Bipolar 1 disorder (HCC) 03/08/2010   Thyrotoxicosis 02/11/2010   ANEMIA 02/11/2010     Current Outpatient Medications on File Prior to Visit  Medication Sig Dispense Refill   albuterol  (VENTOLIN  HFA) 108 (90 Base) MCG/ACT inhaler Inhale 1-2 puffs into the lungs every 6 (six) hours as needed for shortness of breath.     amLODipine  (NORVASC ) 5 MG tablet take 1 tablet by mouth once daily (Patient taking differently: Take 5 mg by mouth at bedtime.) 30 tablet 0   ammonium lactate (AMLACTIN) 12 % cream SMARTSIG:1 sparingly Topical Twice Daily     benzonatate  (TESSALON ) 100 MG capsule Take 1 capsule (100 mg total) by mouth 3 (three) times daily as needed for cough. 21 capsule 0   cetirizine  (ZYRTEC ) 10 MG tablet Take 1 tablet (10 mg total) by mouth daily. 30 tablet 0   Cholecalciferol (VITAMIN D-3) 5000 units TABS Take 1 tablet by mouth daily.     ciclopirox  (LOPROX ) 0.77 % cream Apply topically 2 (two) times daily. 90 g 0   clobetasol ointment (TEMOVATE) 0.05 % Apply 1 a  small amount to affected area twice a day     colesevelam (WELCHOL) 625 MG tablet Take by mouth.     diclofenac  Sodium (VOLTAREN ) 1 % GEL Apply topically.     DUPIXENT 300 MG/2ML SOPN Inject into the skin.     estradiol  (ESTRACE ) 0.5 MG tablet Take 1 tablet (0.5 mg total) by mouth daily. 90 tablet 0   estradiol  (ESTRACE ) 2 MG tablet take 1 tablet by mouth once daily 30 tablet 0   hydrochlorothiazide (HYDRODIURIL) 12.5 MG tablet Take 12.5 mg by mouth daily.     lithium  300 MG tablet Take 300-600 mg by mouth See admin instructions. Take 300mg  in the morning and 600mg  at bedtime.     nystatin  (MYCOSTATIN /NYSTOP ) powder Apply 1 Application topically 2 (two) times daily. 60 g 0   potassium chloride  (KLOR-CON ) 10 MEQ tablet Take by mouth.     pravastatin  (PRAVACHOL ) 20 MG tablet TAKE 1 TABLET(20 MG) BY MOUTH DAILY 90 tablet 0   QUEtiapine  (SEROQUEL ) 300 MG tablet Take 300 mg by mouth at bedtime.     No current facility-administered medications on file prior to visit.    Allergies  Allergen Reactions   Adhesive [Tape] Rash   Latex Rash   Tapentadol Rash   Wound Dressing Adhesive Rash    Social History   Socioeconomic History   Marital status: Single    Spouse name: Not on file   Number of children:  2   Years of education: Not on file   Highest education level: Not on file  Occupational History    Comment: Disability  Tobacco Use   Smoking status: Never    Passive exposure: Never   Smokeless tobacco: Never  Vaping Use   Vaping status: Never Used  Substance and Sexual Activity   Alcohol use: No   Drug use: No   Sexual activity: Yes    Birth control/protection: None, Surgical  Other Topics Concern   Not on file  Social History Narrative   single   Regular exercise:  Yes               Social Drivers of Corporate investment banker Strain: Low Risk  (04/21/2024)   Overall Financial Resource Strain (CARDIA)    Difficulty of Paying Living Expenses: Not hard at all  Food  Insecurity: No Food Insecurity (06/30/2023)   Hunger Vital Sign    Worried About Running Out of Food in the Last Year: Never true    Ran Out of Food in the Last Year: Never true  Transportation Needs: No Transportation Needs (06/30/2023)   PRAPARE - Administrator, Civil Service (Medical): No    Lack of Transportation (Non-Medical): No  Physical Activity: Sufficiently Active (04/21/2024)   Exercise Vital Sign    Days of Exercise per Week: 7 days    Minutes of Exercise per Session: 60 min  Stress: No Stress Concern Present (04/21/2024)   [employer] of Occupational Health - Occupational Stress Questionnaire    Feeling of Stress: Not at all  Social Connections: Moderately Isolated (04/21/2024)   Social Connection and Isolation Panel    Frequency of Communication with Friends and Family: Three times a week    Frequency of Social Gatherings with Friends and Family: Never    Attends Religious Services: More than 4 times per year    Active Member of [employer] or Organizations: No    Attends Banker Meetings: Never    Marital Status: Never married  Intimate Partner Violence: Not At Risk (06/30/2023)   Humiliation, Afraid, Rape, and Kick questionnaire    Fear of Current or Ex-Partner: No    Emotionally Abused: No    Physically Abused: No    Sexually Abused: No    Family History  Problem Relation Age of Onset   Glaucoma Mother    Diabetes Mother    Hypertension Mother    Cancer Father        ?type   Hypertension Father     Past Surgical History:  Procedure Laterality Date   ABDOMINAL HYSTERECTOMY  06/03/2010   BREAST BIOPSY Left    BUNIONECTOMY     bilateral   GALLBLADDER SURGERY  08/19/1983   stones removed   HYDRADENITIS EXCISION Bilateral 05/20/2017   Procedure: EXCISION BILATERAL HIDRADENITIS AXILLA;  Surgeon: Vanderbilt Ned, MD;  Location: [facility] OR;  Service: General;  Laterality: Bilateral;   KNEE SURGERY  08/18/1985   bilateral   LUMBAR  LAMINECTOMY/DECOMPRESSION MICRODISCECTOMY Right 01/13/2013   Procedure: MICRODISCECTOMY FORAMINOTOMY OF L4-5 ON THE RIGHT;  Surgeon: Lynwood SHAUNNA Bern, MD;  Location: [facility] ORS;  Service: Orthopedics;  Laterality: Right;   MEDIASTINOSCOPY  08/19/1999   STERNOTOMY  08/19/1999   TONSILLECTOMY     TUBAL LIGATION  1985, 1996    ROS: Review of Systems Negative except as stated above  PHYSICAL EXAM: BP 126/82   Pulse 86   Temp 98 F (36.7 C) (Oral)  Resp 16   Ht 5' 6 (1.676 m)   Wt 185 lb 9.6 oz (84.2 kg)   LMP 04/18/2010   SpO2 95%   BMI 29.96 kg/m   Physical Exam HENT:     Head: Normocephalic and atraumatic.     Nose: Nose normal.     Mouth/Throat:     Mouth: Mucous membranes are moist.     Pharynx: Oropharynx is clear.  Eyes:     Extraocular Movements: Extraocular movements intact.     Conjunctiva/sclera: Conjunctivae normal.     Pupils: Pupils are equal, round, and reactive to light.  Cardiovascular:     Rate and Rhythm: Normal rate and regular rhythm.     Pulses: Normal pulses.     Heart sounds: Normal heart sounds.  Pulmonary:     Effort: Pulmonary effort is normal.     Breath sounds: Normal breath sounds.  Musculoskeletal:        General: Normal range of motion.     Cervical back: Normal range of motion and neck supple.  Skin:    General: Skin is warm and dry.     Comments: Hyperpigmented spots bilateral lower extremities, no drainage.  Neurological:     General: No focal deficit present.     Mental Status: She is alert and oriented to person, place, and time.  Psychiatric:        Mood and Affect: Mood normal.        Behavior: Behavior normal.     ASSESSMENT AND PLAN: 1. Cervical cancer screening (Primary) - Referral to Gynecology for evaluation/management. - Ambulatory referral to Gynecology  2. Rash and nonspecific skin eruption - Triamcinolone  as prescribed. Counseled on medication adherence/adverse effects.  - Keep all scheduled appointments with  established Dermatology.  - Follow-up with primary provider as scheduled. - triamcinolone  (KENALOG ) 0.025 % cream; Apply 1 Application topically 2 (two) times daily.  Dispense: 60 g; Refill: 2   Patient was given the opportunity to ask questions.  Patient verbalized understanding of the plan and was able to repeat key elements of the plan. Patient was given clear instructions to go to Emergency Department or return to medical center if symptoms don't improve, worsen, or new problems develop.The patient verbalized understanding.   Orders Placed This Encounter  Procedures   Ambulatory referral to Gynecology     Requested Prescriptions   Signed Prescriptions Disp Refills   triamcinolone  (KENALOG ) 0.025 % cream 60 g 2    Sig: Apply 1 Application topically 2 (two) times daily.    Return for Follow-up as needed.  Greig JINNY Drones, NP

## 2024-04-21 NOTE — Progress Notes (Signed)
 Patient needs a referral to a OBGYN, patient has black spots coming all over her body

## 2024-04-25 ENCOUNTER — Encounter: Payer: Self-pay | Admitting: Emergency Medicine

## 2024-04-25 ENCOUNTER — Ambulatory Visit: Admission: EM | Admit: 2024-04-25 | Discharge: 2024-04-25 | Disposition: A | Discharge status: Home / Self Care

## 2024-04-25 DIAGNOSIS — J069 Acute upper respiratory infection, unspecified: Secondary | ICD-10-CM | POA: Diagnosis not present

## 2024-04-25 LAB — POC SOFIA SARS ANTIGEN FIA: SARS Coronavirus 2 Ag: NEGATIVE

## 2024-04-25 NOTE — ED Provider Notes (Signed)
 [facility]    CSN: [id number] Arrival date & time: 04/25/24  0802      History   Chief Complaint Chief Complaint  Patient presents with   Headache   Chills    HPI Lynn Gonzalez is a 63 y.o. female.   Patient here today for evaluation of headache, chills that started about 4 days ago.  She reports initially she had headache and sore throat.  She notes that headache and sore throat have improved somewhat and now she feels as if things are moving into her chest.  She denies any cough.  She has tried taking Tylenol  with mild relief.  She did not measure her temperature.  She denies body aches.  She has not any vomiting.  The history is provided by the patient.    Past Medical History:  Diagnosis Date   ANEMIA    Anxiety    Asthma    B12 DEFICIENCY    Bipolar 1 disorder (HCC) 1985   BREAST CYST, LEFT    Breast tumor    GERD (gastroesophageal reflux disease)    GOITER    H/O cardiovascular stress test    Lex MV 4/14:  No ischemia, EF 72%, normal wall motion   Hypertension    Hyperthyroidism    INTERSTITIAL LUNG DISEASE    Irritable bowel syndrome with diarrhea     Patient Active Problem List   Diagnosis Date Noted   Abnormal weight loss 05/28/2023   Generalized abdominal pain 05/28/2023   Nausea and vomiting 05/28/2023   Chronic diarrhea 12/14/2018   Paresthesias 06/25/2016   Prolonged Q-T interval on ECG 06/25/2016   Hypertension 10/24/2013   Hip pain 06/21/2013   Snoring 04/07/2012   GERD (gastroesophageal reflux disease) 01/22/2012   Gout 03/12/2011   Goiter 10/21/2010   BREAST CYST, LEFT 07/23/2010   B12 DEFICIENCY 03/08/2010   Bipolar 1 disorder (HCC) 03/08/2010   Thyrotoxicosis 02/11/2010   ANEMIA 02/11/2010    Past Surgical History:  Procedure Laterality Date   ABDOMINAL HYSTERECTOMY  06/03/2010   BREAST BIOPSY Left    BUNIONECTOMY     bilateral   GALLBLADDER SURGERY  08/19/1983   stones removed   HYDRADENITIS EXCISION  Bilateral 05/20/2017   Procedure: EXCISION BILATERAL HIDRADENITIS AXILLA;  Surgeon: Vanderbilt Ned, MD;  Location: [facility] OR;  Service: General;  Laterality: Bilateral;   KNEE SURGERY  08/18/1985   bilateral   LUMBAR LAMINECTOMY/DECOMPRESSION MICRODISCECTOMY Right 01/13/2013   Procedure: MICRODISCECTOMY FORAMINOTOMY OF L4-5 ON THE RIGHT;  Surgeon: Lynwood SHAUNNA Bern, MD;  Location: [facility] ORS;  Service: Orthopedics;  Laterality: Right;   MEDIASTINOSCOPY  08/19/1999   STERNOTOMY  08/19/1999   TONSILLECTOMY     TUBAL LIGATION  1985, 1996    OB History   No obstetric history on file.      Home Medications    Prior to Admission medications   Medication Sig Start Date End Date Taking? Authorizing Provider  albuterol  (VENTOLIN  HFA) 108 (90 Base) MCG/ACT inhaler Inhale 1-2 puffs into the lungs every 6 (six) hours as needed for shortness of breath. 10/01/19  Yes [provider]  amLODipine  (NORVASC ) 5 MG tablet take 1 tablet by mouth once daily Patient taking differently: Take 5 mg by mouth at bedtime. 08/23/14  Yes Daryl Setter, NP  Cholecalciferol (VITAMIN D-3) 5000 units TABS Take 1 tablet by mouth daily.   Yes [provider]  DUPIXENT 300 MG/2ML SOPN Inject into the skin. 04/27/23  Yes [provider]  estradiol  (ESTRACE ) 0.5 MG tablet Take 1 tablet (0.5 mg total) by mouth daily. 06/30/23  Yes Lorren, Amy J, NP  hydrochlorothiazide (HYDRODIURIL) 12.5 MG tablet Take 12.5 mg by mouth daily. 01/01/23  Yes [provider]  lithium  300 MG tablet Take 300-600 mg by mouth See admin instructions. Take 300mg  in the morning and 600mg  at bedtime.   Yes [provider]  nystatin  (MYCOSTATIN /NYSTOP ) powder Apply 1 Application topically 2 (two) times daily. 09/02/23  Yes Raspet, Erin K, PA-C  potassium chloride  (KLOR-CON ) 10 MEQ tablet Take by mouth.   Yes [provider]  pravastatin  (PRAVACHOL ) 20 MG tablet TAKE 1 TABLET(20 MG) BY MOUTH DAILY 08/14/23   Yes Lorren, Amy J, NP  QUEtiapine  (SEROQUEL ) 200 MG tablet Take 200 mg by mouth at bedtime. 04/08/24  Yes [provider]  triamcinolone  (KENALOG ) 0.025 % cream Apply 1 Application topically 2 (two) times daily. 04/21/24  Yes Lorren, Amy J, NP  ammonium lactate (AMLACTIN) 12 % cream SMARTSIG:1 sparingly Topical Twice Daily Patient not taking: Reported on 04/25/2024 03/10/23   [provider]  benzonatate  (TESSALON ) 100 MG capsule Take 1 capsule (100 mg total) by mouth 3 (three) times daily as needed for cough. Patient not taking: Reported on 04/25/2024 10/28/22   Vonna Sharlet POUR, MD  cetirizine  (ZYRTEC ) 10 MG tablet Take 1 tablet (10 mg total) by mouth daily. Patient not taking: Reported on 04/25/2024 05/15/20   Adah Corning A, FNP  cholestyramine light (PREVALITE) 4 GM/DOSE powder Take 4 g by mouth 2 (two) times daily. Patient not taking: Reported on 04/25/2024    [provider]  ciclopirox  (LOPROX ) 0.77 % cream Apply topically 2 (two) times daily. Patient not taking: Reported on 04/25/2024 09/02/23   Raspet, Erin K, PA-C  clobetasol ointment (TEMOVATE) 0.05 % Apply 1 a small amount to affected area twice a day Patient not taking: Reported on 04/25/2024 03/06/23   [provider]  colesevelam (WELCHOL) 625 MG tablet Take by mouth. Patient not taking: Reported on 04/25/2024 08/14/23   [provider]  diclofenac  Sodium (VOLTAREN ) 1 % GEL Apply topically. Patient not taking: Reported on 04/25/2024 12/12/22   [provider]  diphenoxylate-atropine (LOMOTIL) 2.5-0.025 MG tablet 1 tablet as needed Orally Four times a day Patient not taking: Reported on 04/25/2024 11/10/23   [provider]  estradiol  (ESTRACE ) 2 MG tablet take 1 tablet by mouth once daily Patient not taking: Reported on 04/25/2024 08/23/14   O'Sullivan, Melissa, NP  fluticasone  (CUTIVATE ) 0.05 % cream Apply topically 2 (two) times daily as needed. Patient not taking: Reported on 04/25/2024  11/20/23   [provider]  fluticasone  (FLONASE ) 50 MCG/ACT nasal spray 1 spray. Patient not taking: Reported on 04/25/2024 11/10/23   [provider]  ketoconazole (NIZORAL) 2 % cream Apply topically. Patient not taking: Reported on 04/25/2024 11/20/23   [provider]  mupirocin  ointment (BACTROBAN ) 2 % Apply topically. Patient not taking: Reported on 04/25/2024 09/18/21   [provider]  naproxen (NAPROSYN) 500 MG tablet Oral Patient not taking: Reported on 04/25/2024 12/26/22   [provider]  QUEtiapine  (SEROQUEL ) 300 MG tablet Take 300 mg by mouth at bedtime. Patient not taking: Reported on 04/25/2024    [provider]    Family History Family History  Problem Relation Age of Onset   Glaucoma Mother    Diabetes Mother    Hypertension Mother    Cancer Father        ?type  Hypertension Father     Social History Social History   Tobacco Use   Smoking status: Never    Passive exposure: Never   Smokeless tobacco: Never  Vaping Use   Vaping status: Never Used  Substance Use Topics   Alcohol use: No   Drug use: No     Allergies   Adhesive [tape], Latex, Tapentadol, and Wound dressing adhesive   Review of Systems Review of Systems  Constitutional:  Positive for fever (subjective). Negative for chills.  HENT:  Positive for congestion and sore throat (resolved). Negative for ear pain.   Eyes:  Negative for discharge and redness.  Respiratory:  Negative for cough, shortness of breath and wheezing.   Gastrointestinal:  Negative for abdominal pain, nausea and vomiting.  Neurological:  Positive for headaches.     Physical Exam Triage Vital Signs ED Triage Vitals  Encounter Vitals Group     BP      Girls Systolic BP Percentile      Girls Diastolic BP Percentile      Boys Systolic BP Percentile      Boys Diastolic BP Percentile      Pulse      Resp      Temp      Temp src      SpO2      Weight      Height      Head  Circumference      Peak Flow      Pain Score      Pain Loc      Pain Education      Exclude from Growth Chart    No data found.  Updated Vital Signs BP 123/86 (BP Location: Left Arm)   Pulse 77   Temp 97.7 F (36.5 C) (Oral)   Resp 14   LMP 04/18/2010   SpO2 96%   Visual Acuity Right Eye Distance:   Left Eye Distance:   Bilateral Distance:    Right Eye Near:   Left Eye Near:    Bilateral Near:     Physical Exam Vitals and nursing note reviewed.  Constitutional:      General: She is not in acute distress.    Appearance: Normal appearance. She is not ill-appearing.  HENT:     Head: Normocephalic and atraumatic.     Nose: Congestion present.     Mouth/Throat:     Mouth: Mucous membranes are moist.     Pharynx: No oropharyngeal exudate or posterior oropharyngeal erythema.  Eyes:     Conjunctiva/sclera: Conjunctivae normal.  Cardiovascular:     Rate and Rhythm: Normal rate and regular rhythm.     Heart sounds: Normal heart sounds. No murmur heard. Pulmonary:     Effort: Pulmonary effort is normal. No respiratory distress.     Breath sounds: Normal breath sounds. No wheezing, rhonchi or rales.  Musculoskeletal:     Cervical back: Normal range of motion. No rigidity.  Skin:    General: Skin is warm and dry.  Neurological:     Mental Status: She is alert.  Psychiatric:        Mood and Affect: Mood normal.        Thought Content: Thought content normal.      UC Treatments / Results  Labs (all labs ordered are listed, but only abnormal results are displayed) Labs Reviewed  POC SOFIA SARS ANTIGEN FIA - Normal    EKG   Radiology No results found.  Procedures  Procedures (including critical care time)  Medications Ordered in UC Medications - No data to display  Initial Impression / Assessment and Plan / UC Course  I have reviewed the triage vital signs and the nursing notes.  Pertinent labs & imaging results that were available during my care of the  patient were reviewed by me and considered in my medical decision making (see chart for details).    COVID screening negative.  Patient outside of treatment window for flu.  Recommended symptomatic treatment, increase fluids and rest with follow-up if no gradual improvement.  Advised evaluation in the emergency room with any worsening.  Patient expressed understanding.  Final Clinical Impressions(s) / UC Diagnoses   Final diagnoses:  Acute upper respiratory infection   Discharge Instructions   None    ED Prescriptions   None    PDMP not reviewed this encounter.   Billy Asberry FALCON, PA-C 04/25/24 [phone]

## 2024-04-25 NOTE — ED Triage Notes (Addendum)
 Pt reports throbbing headaches and chills x4 days. Unsure of fevers as she has no thermometer. Denies cough, congestion, or sore throat. Denies body aches, but states her body feels numb.Taking tylenol  500mg  with no relief. Notes recent change to seroquel  dose and lower quality sleep (300mg  --> 200mg )

## 2024-04-26 ENCOUNTER — Other Ambulatory Visit: Payer: Self-pay

## 2024-04-26 ENCOUNTER — Encounter: Payer: Self-pay | Admitting: Podiatry

## 2024-04-26 ENCOUNTER — Ambulatory Visit (INDEPENDENT_AMBULATORY_CARE_PROVIDER_SITE_OTHER): Admitting: Podiatry

## 2024-04-26 ENCOUNTER — Emergency Department (HOSPITAL_COMMUNITY)
Admission: EM | Admit: 2024-04-26 | Discharge: 2024-04-26 | Discharge status: Against Medical Advice | Attending: Emergency Medicine | Admitting: Emergency Medicine

## 2024-04-26 ENCOUNTER — Emergency Department (HOSPITAL_COMMUNITY)

## 2024-04-26 ENCOUNTER — Encounter (HOSPITAL_COMMUNITY): Payer: Self-pay

## 2024-04-26 VITALS — Ht 66.0 in | Wt 185.0 lb

## 2024-04-26 DIAGNOSIS — M7918 Myalgia, other site: Secondary | ICD-10-CM | POA: Diagnosis not present

## 2024-04-26 DIAGNOSIS — Z5321 Procedure and treatment not carried out due to patient leaving prior to being seen by health care provider: Secondary | ICD-10-CM | POA: Diagnosis not present

## 2024-04-26 DIAGNOSIS — R519 Headache, unspecified: Secondary | ICD-10-CM | POA: Insufficient documentation

## 2024-04-26 DIAGNOSIS — R6883 Chills (without fever): Secondary | ICD-10-CM | POA: Insufficient documentation

## 2024-04-26 DIAGNOSIS — L600 Ingrowing nail: Secondary | ICD-10-CM | POA: Diagnosis not present

## 2024-04-26 LAB — CBC WITH DIFFERENTIAL/PLATELET
Abs Immature Granulocytes: 0.01 K/uL (ref 0.00–0.07)
Basophils Absolute: 0 K/uL (ref 0.0–0.1)
Basophils Relative: 1 %
Eosinophils Absolute: 0.2 K/uL (ref 0.0–0.5)
Eosinophils Relative: 4 %
HCT: 38.5 % (ref 36.0–46.0)
Hemoglobin: 11.2 g/dL — ABNORMAL LOW (ref 12.0–15.0)
Immature Granulocytes: 0 %
Lymphocytes Relative: 33 %
Lymphs Abs: 2 K/uL (ref 0.7–4.0)
MCH: 26.3 pg (ref 26.0–34.0)
MCHC: 29.1 g/dL — ABNORMAL LOW (ref 30.0–36.0)
MCV: 90.4 fL (ref 80.0–100.0)
Monocytes Absolute: 0.5 K/uL (ref 0.1–1.0)
Monocytes Relative: 9 %
Neutro Abs: 3.3 K/uL (ref 1.7–7.7)
Neutrophils Relative %: 53 %
Platelets: 221 K/uL (ref 150–400)
RBC: 4.26 MIL/uL (ref 3.87–5.11)
RDW: 14.1 % (ref 11.5–15.5)
WBC: 6.2 K/uL (ref 4.0–10.5)
nRBC: 0 % (ref 0.0–0.2)

## 2024-04-26 LAB — I-STAT CHEM 8, ED
BUN: 9 mg/dL (ref 8–23)
Calcium, Ion: 1.25 mmol/L (ref 1.15–1.40)
Chloride: 104 mmol/L (ref 98–111)
Creatinine, Ser: 1 mg/dL (ref 0.44–1.00)
Glucose, Bld: 100 mg/dL — ABNORMAL HIGH (ref 70–99)
HCT: 38 % (ref 36.0–46.0)
Hemoglobin: 12.9 g/dL (ref 12.0–15.0)
Potassium: 3.9 mmol/L (ref 3.5–5.1)
Sodium: 141 mmol/L (ref 135–145)
TCO2: 27 mmol/L (ref 22–32)

## 2024-04-26 LAB — BASIC METABOLIC PANEL WITH GFR
Anion gap: 12 (ref 5–15)
BUN: 9 mg/dL (ref 8–23)
CO2: 24 mmol/L (ref 22–32)
Calcium: 9.9 mg/dL (ref 8.9–10.3)
Chloride: 104 mmol/L (ref 98–111)
Creatinine, Ser: 0.84 mg/dL (ref 0.44–1.00)
GFR, Estimated: >60 mL/min (ref >60)
Glucose, Bld: 103 mg/dL — ABNORMAL HIGH (ref 70–99)
Potassium: 3.8 mmol/L (ref 3.5–5.1)
Sodium: 140 mmol/L (ref 135–145)

## 2024-04-26 LAB — RESP PANEL BY RT-PCR (RSV, FLU A&B, COVID)  RVPGX2
Influenza A by PCR: NEGATIVE
Influenza B by PCR: NEGATIVE
Resp Syncytial Virus by PCR: NEGATIVE
SARS Coronavirus 2 by RT PCR: NEGATIVE

## 2024-04-26 MED ORDER — ACETAMINOPHEN 500 MG PO TABS
1000.0000 mg | ORAL_TABLET | Freq: Once | ORAL | Status: AC
  Administered 2024-04-26: 1000 mg via ORAL
  Filled 2024-04-26: qty 2

## 2024-04-26 MED ORDER — IOHEXOL 350 MG/ML SOLN
75.0000 mL | Freq: Once | INTRAVENOUS | Status: AC | PRN
  Administered 2024-04-26: 75 mL via INTRAVENOUS

## 2024-04-26 MED ORDER — METOCLOPRAMIDE HCL 5 MG/ML IJ SOLN
10.0000 mg | Freq: Once | INTRAMUSCULAR | Status: AC
  Administered 2024-04-26: 10 mg via INTRAVENOUS
  Filled 2024-04-26: qty 2

## 2024-04-26 NOTE — Patient Instructions (Signed)

## 2024-04-26 NOTE — ED Provider Triage Note (Signed)
 Emergency Medicine Provider Triage Evaluation Note  Lynn Gonzalez , a 63 y.o. female  was evaluated in triage.  Pt complains of headache that began Thursday evening.  Patient reports that it came out of nowhere.  Patient with a history of bipolar disorder is on lithium ..  Review of Systems  Positive:  Negative:   Physical Exam  BP (!) 144/101 (BP Location: Right Arm)   Pulse 76   Temp 97.7 F (36.5 C)   Resp 16   Ht 5' 6 (1.676 m)   Wt 83.9 kg   LMP 04/18/2010   SpO2 99%   BMI 29.86 kg/m  Gen:   Awake, no distress   Resp:  Normal effort  MSK:   Moves extremities without difficulty  Other:  Neuro-no cranial nerve deficits, no weakness, no numbness.  Normal gait.  Medical Decision Making  Medically screening exam initiated at 9:37 AM.  Appropriate orders placed.  Marsha Caldron Keckler was informed that the remainder of the evaluation will be completed by another provider, this initial triage assessment does not replace that evaluation, and the importance of remaining in the ED until their evaluation is complete.  Patient presenting with headache.  Given sudden onset, absence of prior headaches, will obtain CTA of the patient's head and neck.  Analgesia ordered.  No neurological deficits identified on exam.   Mannie Fairy DASEN, DO 04/26/24 [phone]

## 2024-04-26 NOTE — Progress Notes (Signed)
 Patient complains of painful ingrown dystrophic gryphotic ingrown nail second left.  Hallux right healing well. Patient denies fevers, chills, nausea, vomiting.  Objective:  Vitals: Reviewed  General: Well developed, nourished, in no acute distress, alert and oriented x3   Vascular: DP pulse 2/4 bilateral. PT pulse 2/4 bilateral. Minimal edema  Dermatology: Erythema, edema, incurvated nail border second toe left with no drainage .  Extremely thick and gryphotic.  Tenderness present with palpation. Normal skin tone and texture feet with normal hair growth.  Neurological: Grossly intact. Normal reflexes.   Musculoskeletal: Tenderness with palpation of the distal second toe left. No tenderness or painful ROM at IPJ.  Diagnosis: Ingrown nail second toe left  Plan: -discussed etiology and treatment of ingrown nails. Discussed surgical vs conservative treatment. -Consent signed for appropriate matrixectomy affected nail(s).   Procedure(s):   - Matrixectomy(s) total nail second toe left: Toe anesthetized with 3cc 2:1 mixture 2% Lidocaine  with epinephrine : Sodium Bicarbonate. Surgical site prepped. Digital tourniquet applied.  Avulsion of nail plate. performed. Matrixecomy performed with three 30 second applications of phenol to nail matrix. Site irrigated with alcohol.  Tourniquet released with good vascularity noticed in digit.  Applied triple antibiotic to nailbed and applied gauze and Coban dressing. - Written and oral postoperative instructions given.  -Return for post-op 2 weeks.  JINNY Prentice Binder, DPM

## 2024-04-26 NOTE — ED Triage Notes (Addendum)
 Pt states she has had a headache x 5 days. Pt has had chills as well, denies fevers. Pt states her neck feels stiff, is able to move back and forth, up and down. Pt c/o aching all over. Pt denies dizziness, blurred vision, nausea or vomiting.

## 2024-04-26 NOTE — ED Notes (Signed)
 IV removed. Pt left AMA. Informed to follow up with PCP with concerns.

## 2024-05-13 ENCOUNTER — Ambulatory Visit: Admitting: Family

## 2024-05-16 ENCOUNTER — Telehealth: Payer: Self-pay | Admitting: Podiatry

## 2024-05-16 NOTE — Telephone Encounter (Signed)
 Patient requested to send a message to nurse or provider regarding, knot on patient toe, which is in pan. Nurse stated due to provider does not have any available slots open at the time patient requested, and patient request to only come in the morning. Nurse stated, Patient can schedule appointment on the next available appt. LVM for patient to contact [facility] to schedule on next appointment.

## 2024-05-16 NOTE — Progress Notes (Signed)
 SABRA

## 2024-05-17 ENCOUNTER — Ambulatory Visit: Admitting: Podiatry

## 2024-05-17 ENCOUNTER — Encounter: Payer: Self-pay | Admitting: Podiatry

## 2024-05-17 DIAGNOSIS — L02612 Cutaneous abscess of left foot: Secondary | ICD-10-CM

## 2024-05-17 DIAGNOSIS — L03032 Cellulitis of left toe: Secondary | ICD-10-CM | POA: Diagnosis not present

## 2024-05-17 MED ORDER — MUPIROCIN 2 % EX OINT: 1.0000 | TOPICAL_OINTMENT | Freq: Two times a day (BID) | CUTANEOUS | 2 refills | Status: AC

## 2024-05-17 MED ORDER — AMOXICILLIN-POT CLAVULANATE 875-125 MG PO TABS: 1.0000 | ORAL_TABLET | Freq: Two times a day (BID) | ORAL | 0 refills | Status: AC

## 2024-05-17 NOTE — Patient Instructions (Signed)

## 2024-05-17 NOTE — Progress Notes (Signed)
 Patient presents 3 weeks status post nail surgery with pain of the toe.  This is first time we seen her since the procedure.  No fever chills nausea or vomiting.  Painful she indicates that the plantar lateral aspect of the distal second toe.  Has some tenderness under the toe also where there is a fissure in the sulcus.   Physical exam:  General appearance: Pleasant, and in no acute distress. AOx3.  Vascular: Pedal pulses: DP 2/4 bilaterally, PT 2/4 bilaterally. Mild edema toe 2 LT. Capillary fill time immediate b/l.  Neurological: Light touch intact feet bilaterally.  Normal Achilles reflex bilaterally.  No clonus or spasticity noted.   Dermatologic:   Tenderness at the plantar medial aspect of the tuft of the second toe left.  Nailbed appears to be healing well with normal healing of the slight amount of clear drainage.  Slight erythema around the nail folds second toe is swollen postop within normal limits for procedure.  There is a fissure on the plantar aspect of the toe at the sulcus that is somewhat tender.  Skin normal temperature bilaterally.  Skin normal color, tone, and texture bilaterally.   Musculoskeletal:    Diagnosis: Cellulitis second toe left  Plan: -Established office visit for evaluation and management level 3. - Discussed breaking the pain in the toe most likely she is having a slight reaction of the phenol and a little bit of hyper reaction to it.  She could be developing a cellulitis.  As a precaution we will place her on Augmentin for 7 days and also have her apply Bactroban  to the wound after cleaning with soapy water. -Rx Augmentin 875 mg, 1 p.o. twice daily for 7 days - Rx Bactroban  ointment, apply twice daily to affected area toe.  Return 2 weeks f/u cellulitis toe 2 LT

## 2024-05-17 NOTE — Telephone Encounter (Signed)
 Patient has schedule/d appointment for today

## 2024-05-18 ENCOUNTER — Telehealth: Payer: Self-pay

## 2024-05-18 ENCOUNTER — Other Ambulatory Visit: Payer: Self-pay | Admitting: Family

## 2024-05-18 DIAGNOSIS — Z1211 Encounter for screening for malignant neoplasm of colon: Secondary | ICD-10-CM

## 2024-05-18 NOTE — Telephone Encounter (Signed)
 Copied from CRM #[id number]. Topic: Referral - Request for Referral >> May 18, 2024  2:15 PM Nathanel BROCKS wrote: Did the patient discuss referral with their provider in the last year? No (If No - schedule appointment) (If Yes - send message)  Appointment offered? No  Type of order/referral and detailed reason for visit: pt is wanting to have a colonoscopy. Please schedule.  Preference of office, provider, location: [facility]  If referral order, have you been seen by this specialty before? Yes (If Yes, this issue or another issue? When? Where? Not sure  Can we respond through MyChart? No

## 2024-05-18 NOTE — Telephone Encounter (Signed)
 Complete

## 2024-05-19 NOTE — Telephone Encounter (Signed)
 I called patient with referral information no one answered so I left a voicemail to return my call.

## 2024-05-24 ENCOUNTER — Ambulatory Visit: Admitting: Podiatry

## 2024-06-01 NOTE — Telephone Encounter (Signed)
I called patient and gave her the referral information.

## 2024-06-01 NOTE — Telephone Encounter (Signed)
 Copied from CRM [id number]. Topic: Referral - Status >> May 31, 2024  4:33 PM Shanda MATSU wrote: Reason for CRM: Patient is calling to check the status of referral, patient stated that she adv that she cannot go to the provider the referral was sent to, Dr. Rollin, so she was adv that she would be called back once a different specialist was found for her, patient req an update.

## 2024-06-06 ENCOUNTER — Ambulatory Visit

## 2024-06-07 ENCOUNTER — Ambulatory Visit (INDEPENDENT_AMBULATORY_CARE_PROVIDER_SITE_OTHER): Admitting: Podiatry

## 2024-06-07 DIAGNOSIS — B353 Tinea pedis: Secondary | ICD-10-CM

## 2024-06-07 MED ORDER — KETOCONAZOLE 2 % EX CREA: TOPICAL_CREAM | CUTANEOUS | 2 refills | Status: AC

## 2024-06-07 NOTE — Progress Notes (Signed)
 Patient presents follow-up wound second toe left and cellulitis.  Doing much better.  No drainage.  Tip of the toe is to second toe left is still little bit tender.  Complains of a dry scaly rash on the feet with cracking and fissuring around the heels bilaterally.   Physical exam:  General appearance: Pleasant, and in no acute distress. AOx3.  Vascular: Pedal pulses: DP 2/4 bilaterally, PT 2/4 bilaterally.  Mild edema lower legs bilaterally. Capillary fill time immediate bilaterally.  Neurological: Grossly intact bilaterally  Dermatologic:   Wound on second toe left secondary to nail surgery is completely healed.  Dry scaly erythematous rash in the moccasin distribution feet bilaterally.  Cracking and fissuring around the heels bilaterally.  Some onychomycotic changes of the nails 1 through 5 bilaterally.  Musculoskeletal: Some tenderness distal aspect second toe left  Diagnosis: 1.  Tinea pedis bilaterally  Plan: -Established office visit for evaluation and management level 3. - Discussed the tinea pedis and the chronic nature of it.  Will have her try topical ketoconazole.  Partial probably have to use it for about 6 to 8 weeks to clear it.  If this does not cleared well we might need to try oral Lamisil.  -Discussed the cracking around the heels recommended AmLactin cream to keep the skin from splitting and hydrate well.  Return as needed, if dry scaly rash feet bilaterally is not resolved in 6 to 8 weeks to call for an appointment

## 2024-06-08 ENCOUNTER — Ambulatory Visit: Admitting: Family Medicine

## 2024-07-13 ENCOUNTER — Telehealth: Payer: Self-pay | Admitting: Family

## 2024-07-13 NOTE — Telephone Encounter (Signed)
 Copied from CRM #[id number]. Topic: Appointments - Appointment Scheduling >> Jul 13, 2024  2:13 PM [name] ORN wrote: Patient called wanting to reschedule for her Medicare AWV but I see there's a message left that she's been dismissed from the department. Please follow up with patient.

## 2024-09-21 ENCOUNTER — Encounter: Payer: Self-pay | Admitting: Family

## 2024-09-21 NOTE — Progress Notes (Signed)
 Erroneous encounter-disregard

## 2024-11-03 ENCOUNTER — Ambulatory Visit: Admitting: Family

## 2024-11-10 ENCOUNTER — Ambulatory Visit: Payer: Self-pay
